# Patient Record
Sex: Female | Born: 1985
Health system: Southern US, Community
[De-identification: ages and names within clinical notes are randomized; demographics above are authoritative.]

## PROBLEM LIST (undated history)

## (undated) DIAGNOSIS — F419 Anxiety disorder, unspecified: Secondary | ICD-10-CM

## (undated) DIAGNOSIS — B2790 Infectious mononucleosis, unspecified without complication: Secondary | ICD-10-CM

## (undated) DIAGNOSIS — F329 Major depressive disorder, single episode, unspecified: Secondary | ICD-10-CM

## (undated) DIAGNOSIS — G43909 Migraine, unspecified, not intractable, without status migrainosus: Secondary | ICD-10-CM

## (undated) DIAGNOSIS — R000 Tachycardia, unspecified: Secondary | ICD-10-CM

## (undated) DIAGNOSIS — F32A Depression, unspecified: Secondary | ICD-10-CM

## (undated) DIAGNOSIS — R002 Palpitations: Secondary | ICD-10-CM

## (undated) DIAGNOSIS — R001 Bradycardia, unspecified: Secondary | ICD-10-CM

## (undated) HISTORY — DX: Depression, unspecified: F32.A

## (undated) HISTORY — DX: Palpitations: R00.2

## (undated) HISTORY — DX: Tachycardia, unspecified: R00.0

## (undated) HISTORY — DX: Anxiety disorder, unspecified: F41.9

## (undated) HISTORY — DX: Bradycardia, unspecified: R00.1

## (undated) HISTORY — DX: Major depressive disorder, single episode, unspecified: F32.9

## (undated) HISTORY — PX: APPENDECTOMY: SHX54

## (undated) HISTORY — DX: Migraine, unspecified, not intractable, without status migrainosus: G43.909

## (undated) HISTORY — PX: TONSILLECTOMY: SUR1361

---

## 2007-08-24 ENCOUNTER — Other Ambulatory Visit: Admission: RE | Admit: 2007-08-24 | Discharge: 2007-08-24 | Payer: Self-pay | Admitting: Obstetrics and Gynecology

## 2007-10-04 ENCOUNTER — Ambulatory Visit (HOSPITAL_BASED_OUTPATIENT_CLINIC_OR_DEPARTMENT_OTHER): Admission: RE | Admit: 2007-10-04 | Discharge: 2007-10-04 | Payer: Self-pay | Admitting: Otolaryngology

## 2007-10-04 ENCOUNTER — Encounter (INDEPENDENT_AMBULATORY_CARE_PROVIDER_SITE_OTHER): Payer: Self-pay | Admitting: Otolaryngology

## 2010-07-15 NOTE — Op Note (Signed)
Lori Rice, Lori Rice            ACCOUNT NO.:  [id number]   MEDICAL RECORD NO.:  [id number]          PATIENT TYPE:  AMB   LOCATION:  DSC                          FACILITY:  [facility]   PHYSICIAN:  Christopher E. Ezzard Standing, M.D.DATE OF BIRTH:  Jul 24, 1985   DATE OF PROCEDURE:  10/04/2007  DATE OF DISCHARGE:                               OPERATIVE REPORT   PREOPERATIVE DIAGNOSIS:  Recurrent tonsillitis.   POSTOPERATIVE DIAGNOSIS:  Recurrent tonsillitis.   OPERATION PERFORMED:  Tonsillectomy.   SURGEON:  Kristine Garbe. Ezzard Standing, MD   ANESTHESIA:  General endotracheal.   COMPLICATIONS:  None.   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  This has been worse over the  last 2 years  and she has had mono.  She has very large cryptic tonsils.  She has had several episodes of strep throat as well as a questionable  abscess and one tonsil.  She is taken to the operating room this time  for tonsillectomy.  She is otherwise healthy.   DESCRIPTION OF PROCEDURE:  After adequate endotracheal anesthesia, the  patient received 1 gram Ancef IV preoperatively as well as 10 mg of  Decadron IV preoperatively.  A mouth gag was used to expose the  oropharynx.  The left and right tonsils were then resected from the  tonsillar fossa using a cautery.  Care was taken to preserve the  anterior-posterior tonsillar pillars as well as the uvula.  Hemostasis  was obtained with cautery.  After obtaining adequate hemostasis, the  oropharynx was irrigated with saline.  This completed the procedure.  Lori Rice was awoken from anesthesia and transferred to recovery and postop  doing well.   DISPOSITION:  Lori Rice was discharged home later this morning on amoxicillin  suspension 500 mg b.i.d. for 1 week, Tylenol and Lortab elixir 3-4  teaspoons q.4 h. p.r.n. pain.  We will have her followup in my office in  2 weeks for recheck.     ______________________________  Kristine Garbe. Ezzard Standing, M.D.     CEN/MEDQ  D:  10/04/2007  T:  10/04/2007  Job:  [id number]

## 2010-11-28 LAB — POCT HEMOGLOBIN-HEMACUE: Hemoglobin: 13.7

## 2011-03-07 ENCOUNTER — Encounter: Payer: Self-pay | Admitting: Emergency Medicine

## 2011-03-07 ENCOUNTER — Emergency Department (HOSPITAL_BASED_OUTPATIENT_CLINIC_OR_DEPARTMENT_OTHER)
Admission: EM | Admit: 2011-03-07 | Discharge: 2011-03-07 | Disposition: A | Discharge status: Home / Self Care | Payer: Managed Care, Other (non HMO) | Attending: Emergency Medicine | Admitting: Emergency Medicine

## 2011-03-07 DIAGNOSIS — R509 Fever, unspecified: Secondary | ICD-10-CM | POA: Insufficient documentation

## 2011-03-07 DIAGNOSIS — J029 Acute pharyngitis, unspecified: Secondary | ICD-10-CM | POA: Insufficient documentation

## 2011-03-07 DIAGNOSIS — M542 Cervicalgia: Secondary | ICD-10-CM | POA: Insufficient documentation

## 2011-03-07 DIAGNOSIS — R51 Headache: Secondary | ICD-10-CM | POA: Insufficient documentation

## 2011-03-07 HISTORY — DX: Infectious mononucleosis, unspecified without complication: B27.90

## 2011-03-07 LAB — MONONUCLEOSIS SCREEN: Mono Screen: NEGATIVE

## 2011-03-07 MED ORDER — KETOROLAC TROMETHAMINE 30 MG/ML IJ SOLN
30.0000 mg | Freq: Once | INTRAMUSCULAR | Status: AC
  Administered 2011-03-07: 30 mg via INTRAVENOUS
  Filled 2011-03-07: qty 1

## 2011-03-07 MED ORDER — ONDANSETRON HCL 4 MG/2ML IJ SOLN
4.0000 mg | Freq: Once | INTRAMUSCULAR | Status: AC
  Administered 2011-03-07: 4 mg via INTRAVENOUS
  Filled 2011-03-07: qty 2

## 2011-03-07 MED ORDER — LIDOCAINE VISCOUS 2 % MT SOLN
20.0000 mL | Freq: Once | OROMUCOSAL | Status: AC
  Administered 2011-03-07: 20 mL via OROMUCOSAL
  Filled 2011-03-07: qty 15

## 2011-03-07 MED ORDER — SODIUM CHLORIDE 0.9 % IV BOLUS (SEPSIS)
1000.0000 mL | Freq: Once | INTRAVENOUS | Status: AC
  Administered 2011-03-07: 1000 mL via INTRAVENOUS

## 2011-03-07 NOTE — ED Notes (Signed)
Lori Pickering, FNP at bedside 

## 2011-03-07 NOTE — ED Notes (Signed)
Rapid strep collected and sent to lab.

## 2011-03-07 NOTE — ED Notes (Signed)
Sore throat, fever, headache, neck pain, generalized aches and pains x 3 days.  Pt relates that it is getting progressively worse.

## 2011-03-07 NOTE — ED Provider Notes (Signed)
History     CSN: [id number]  Arrival date & time 03/07/11  1318   First MD Initiated Contact with Patient 03/07/11 1408      Chief Complaint  Patient presents with  . Sore Throat  . Fever  . Headache  . Neck Pain    (Consider location/radiation/quality/duration/timing/severity/associated sxs/prior treatment) HPI Comments: Pt states that she was seen at urgent care yesterday and given a shot of steriods levaquin and pain medication:pt states that she felt better for a little while but now the symptoms are back  Patient is a 26 y.o. female presenting with pharyngitis. The history is provided by the patient and a parent.  Sore Throat This is a new problem. The current episode started in the past 7 days. The problem occurs constantly. The problem has been unchanged. Associated symptoms include a fever, headaches, myalgias and neck pain. Pertinent negatives include no coughing. The symptoms are aggravated by nothing. She has tried nothing for the symptoms.    Past Medical History  Diagnosis Date  . Mononucleosis     Past Surgical History  Procedure Date  . Tonsillectomy     History reviewed. No pertinent family history.  History  Substance Use Topics  . Smoking status: Not on file  . Smokeless tobacco: Not on file  . Alcohol Use:     OB History    Grav Para Term Preterm Abortions TAB SAB Ect Mult Living                  Review of Systems  Constitutional: Positive for fever.  HENT: Positive for neck pain.   Respiratory: Negative for cough.   Musculoskeletal: Positive for myalgias.  Neurological: Positive for headaches.  All other systems reviewed and are negative.    Allergies  Review of patient's allergies indicates no known allergies.  Home Medications   Current Outpatient Rx  Name Route Sig Dispense Refill  . HYDROMET PO Oral Take 5 mLs by mouth every 8 (eight) hours as needed.      . IBUPROFEN 200 MG PO TABS Oral Take 400 mg by mouth every 6 (six)  hours as needed.      [name] LEVOFLOXACIN 500 MG PO TABS Oral Take 500 mg by mouth daily.      [name] PROMETHAZINE HCL 25 MG RE SUPP Rectal Place 25 mg rectally every 8 (eight) hours as needed.        BP 109/69  Pulse 79  Temp(Src) 99.2 F (37.3 C) (Oral)  Resp 16  SpO2 98%  LMP 02/09/2011  Physical Exam  Nursing note and vitals reviewed. Constitutional: She is oriented to person, place, and time. She appears well-developed.  HENT:  Head: Normocephalic and atraumatic.  Left Ear: External ear normal.  Nose: Rhinorrhea present.  Mouth/Throat: Posterior oropharyngeal edema and posterior oropharyngeal erythema present.  Neck: Normal range of motion. Neck supple.  Cardiovascular: Normal rate.   Pulmonary/Chest: Effort normal.  Abdominal: Soft. Bowel sounds are normal.  Musculoskeletal: Normal range of motion.  Neurological: She is alert and oriented to person, place, and time.  Skin: Skin is warm and dry.  Psychiatric: She has a normal mood and affect.    ED Course  Procedures (including critical care time)   Labs Reviewed  RAPID STREP SCREEN  MONONUCLEOSIS SCREEN   No results found.   1. Pharyngitis       MDM  Pt feeling a lot better for fluid:pt is okay to follow up:pt was given viscous lidocaine to  mix and swish and spit at home        Teressa Lower, NP 03/07/11 1609

## 2011-03-08 NOTE — ED Provider Notes (Signed)
Medical screening examination/treatment/procedure(s) were performed by non-physician practitioner and as supervising physician I was immediately available for consultation/collaboration.   Jaciel Diem A. Halley Shepheard, MD 03/08/11 0701 

## 2012-07-21 ENCOUNTER — Encounter (HOSPITAL_COMMUNITY)
Admission: EM | Disposition: A | Discharge status: Home / Self Care | Payer: Self-pay | Source: Home / Self Care | Attending: Emergency Medicine

## 2012-07-21 ENCOUNTER — Encounter (HOSPITAL_BASED_OUTPATIENT_CLINIC_OR_DEPARTMENT_OTHER): Payer: Self-pay | Admitting: Emergency Medicine

## 2012-07-21 ENCOUNTER — Emergency Department (HOSPITAL_BASED_OUTPATIENT_CLINIC_OR_DEPARTMENT_OTHER): Payer: Managed Care, Other (non HMO)

## 2012-07-21 ENCOUNTER — Emergency Department (HOSPITAL_COMMUNITY): Payer: Managed Care, Other (non HMO) | Admitting: Anesthesiology

## 2012-07-21 ENCOUNTER — Encounter (HOSPITAL_COMMUNITY): Payer: Self-pay | Admitting: Anesthesiology

## 2012-07-21 ENCOUNTER — Observation Stay (HOSPITAL_BASED_OUTPATIENT_CLINIC_OR_DEPARTMENT_OTHER)
Admission: EM | Admit: 2012-07-21 | Discharge: 2012-07-22 | Disposition: A | Discharge status: Home / Self Care | Payer: Managed Care, Other (non HMO) | Attending: General Surgery | Admitting: General Surgery

## 2012-07-21 DIAGNOSIS — K37 Unspecified appendicitis: Secondary | ICD-10-CM | POA: Diagnosis present

## 2012-07-21 DIAGNOSIS — K3580 Unspecified acute appendicitis: Principal | ICD-10-CM | POA: Insufficient documentation

## 2012-07-21 DIAGNOSIS — R110 Nausea: Secondary | ICD-10-CM | POA: Insufficient documentation

## 2012-07-21 DIAGNOSIS — R339 Retention of urine, unspecified: Secondary | ICD-10-CM | POA: Insufficient documentation

## 2012-07-21 HISTORY — PX: LAPAROSCOPIC APPENDECTOMY: SHX408

## 2012-07-21 LAB — URINALYSIS, ROUTINE W REFLEX MICROSCOPIC
Bilirubin Urine: NEGATIVE
Hgb urine dipstick: NEGATIVE
Nitrite: NEGATIVE
Protein, ur: NEGATIVE mg/dL
Urobilinogen, UA: 0.2 mg/dL (ref 0.0–1.0)

## 2012-07-21 LAB — WET PREP, GENITAL
Clue Cells Wet Prep HPF POC: NONE SEEN
Trich, Wet Prep: NONE SEEN

## 2012-07-21 LAB — CBC WITH DIFFERENTIAL/PLATELET
Eosinophils Absolute: 0 10*3/uL (ref 0.0–0.7)
Lymphocytes Relative: 7 % — ABNORMAL LOW (ref 12–46)
Lymphs Abs: 0.7 10*3/uL (ref 0.7–4.0)
MCH: 30.7 pg (ref 26.0–34.0)
Neutro Abs: 9.1 10*3/uL — ABNORMAL HIGH (ref 1.7–7.7)
Neutrophils Relative %: 85 % — ABNORMAL HIGH (ref 43–77)
Platelets: 167 10*3/uL (ref 150–400)
RBC: 4.46 MIL/uL (ref 3.87–5.11)
WBC: 10.7 10*3/uL — ABNORMAL HIGH (ref 4.0–10.5)

## 2012-07-21 LAB — COMPREHENSIVE METABOLIC PANEL
ALT: 23 U/L (ref 0–35)
Alkaline Phosphatase: 80 U/L (ref 39–117)
Chloride: 103 mEq/L (ref 96–112)
GFR calc Af Amer: >90 mL/min (ref >90)
Glucose, Bld: 115 mg/dL — ABNORMAL HIGH (ref 70–99)
Potassium: 4.2 mEq/L (ref 3.5–5.1)
Sodium: 139 mEq/L (ref 135–145)
Total Protein: 7 g/dL (ref 6.0–8.3)

## 2012-07-21 LAB — CBC
HCT: 35.9 % — ABNORMAL LOW (ref 36.0–46.0)
MCV: 89.8 fL (ref 78.0–100.0)
RBC: 4 MIL/uL (ref 3.87–5.11)
WBC: 8.3 10*3/uL (ref 4.0–10.5)

## 2012-07-21 LAB — CREATININE, SERUM: GFR calc Af Amer: >90 mL/min (ref >90)

## 2012-07-21 SURGERY — APPENDECTOMY, LAPAROSCOPIC
Anesthesia: General | Site: Abdomen | Wound class: Dirty or Infected

## 2012-07-21 MED ORDER — FENTANYL CITRATE 0.05 MG/ML IJ SOLN
INTRAMUSCULAR | Status: DC | PRN
  Administered 2012-07-21: 100 ug via INTRAVENOUS
  Administered 2012-07-21: 50 ug via INTRAVENOUS

## 2012-07-21 MED ORDER — FENTANYL CITRATE 0.05 MG/ML IJ SOLN
100.0000 ug | Freq: Once | INTRAMUSCULAR | Status: AC
  Administered 2012-07-21: 100 ug via INTRAVENOUS
  Filled 2012-07-21: qty 2

## 2012-07-21 MED ORDER — ONDANSETRON HCL 4 MG PO TABS: 4.0000 mg | ORAL_TABLET | Freq: Four times a day (QID) | ORAL | Status: DC | PRN

## 2012-07-21 MED ORDER — HYDROMORPHONE HCL PF 1 MG/ML IJ SOLN
INTRAMUSCULAR | Status: AC
  Filled 2012-07-21: qty 1

## 2012-07-21 MED ORDER — OXYCODONE-ACETAMINOPHEN 5-325 MG PO TABS: 1.0000 | ORAL_TABLET | ORAL | Status: DC | PRN

## 2012-07-21 MED ORDER — GLYCOPYRROLATE 0.2 MG/ML IJ SOLN
INTRAMUSCULAR | Status: DC | PRN
  Administered 2012-07-21: 0.6 mg via INTRAVENOUS
  Administered 2012-07-21: 0.2 mg via INTRAVENOUS

## 2012-07-21 MED ORDER — ONDANSETRON HCL 4 MG/2ML IJ SOLN
4.0000 mg | Freq: Once | INTRAMUSCULAR | Status: AC
  Administered 2012-07-21: 4 mg via INTRAVENOUS
  Filled 2012-07-21: qty 2

## 2012-07-21 MED ORDER — ACETAMINOPHEN 10 MG/ML IV SOLN
INTRAVENOUS | Status: AC
  Filled 2012-07-21: qty 100

## 2012-07-21 MED ORDER — ROCURONIUM BROMIDE 100 MG/10ML IV SOLN
INTRAVENOUS | Status: DC | PRN
  Administered 2012-07-21: 2 mg via INTRAVENOUS
  Administered 2012-07-21: 10 mg via INTRAVENOUS
  Administered 2012-07-21: 28 mg via INTRAVENOUS

## 2012-07-21 MED ORDER — BUPIVACAINE HCL (PF) 0.25 % IJ SOLN
INTRAMUSCULAR | Status: AC
  Filled 2012-07-21: qty 30

## 2012-07-21 MED ORDER — LACTATED RINGERS IV SOLN
INTRAVENOUS | Status: DC
  Administered 2012-07-21 (×2): via INTRAVENOUS

## 2012-07-21 MED ORDER — HYDROMORPHONE HCL PF 1 MG/ML IJ SOLN
0.2500 mg | INTRAMUSCULAR | Status: DC | PRN
  Administered 2012-07-21 (×2): 0.5 mg via INTRAVENOUS

## 2012-07-21 MED ORDER — PROPOFOL 10 MG/ML IV BOLUS
INTRAVENOUS | Status: DC | PRN
  Administered 2012-07-21: 150 mg via INTRAVENOUS

## 2012-07-21 MED ORDER — LIDOCAINE HCL (PF) 2 % IJ SOLN
INTRAMUSCULAR | Status: DC | PRN
  Administered 2012-07-21: 75 mg

## 2012-07-21 MED ORDER — HEPARIN SODIUM (PORCINE) 5000 UNIT/ML IJ SOLN
5000.0000 [IU] | Freq: Three times a day (TID) | INTRAMUSCULAR | Status: DC
  Administered 2012-07-22: 5000 [IU] via SUBCUTANEOUS
  Filled 2012-07-21 (×3): qty 1

## 2012-07-21 MED ORDER — MIDAZOLAM HCL 5 MG/5ML IJ SOLN
INTRAMUSCULAR | Status: DC | PRN
  Administered 2012-07-21: 2 mg via INTRAVENOUS

## 2012-07-21 MED ORDER — ERTAPENEM SODIUM 1 G IJ SOLR
1.0000 g | Freq: Once | INTRAMUSCULAR | Status: AC
  Administered 2012-07-21: 1 g via INTRAVENOUS
  Filled 2012-07-21: qty 1

## 2012-07-21 MED ORDER — KCL IN DEXTROSE-NACL 20-5-0.45 MEQ/L-%-% IV SOLN
INTRAVENOUS | Status: DC
  Administered 2012-07-21 – 2012-07-22 (×3): via INTRAVENOUS
  Filled 2012-07-21 (×4): qty 1000

## 2012-07-21 MED ORDER — IOHEXOL 300 MG/ML  SOLN
100.0000 mL | Freq: Once | INTRAMUSCULAR | Status: AC | PRN
  Administered 2012-07-21: 100 mL via INTRAVENOUS

## 2012-07-21 MED ORDER — IOHEXOL 300 MG/ML  SOLN
50.0000 mL | Freq: Once | INTRAMUSCULAR | Status: AC | PRN
  Administered 2012-07-21: 50 mL via ORAL

## 2012-07-21 MED ORDER — LACTATED RINGERS IR SOLN
Status: DC | PRN
  Administered 2012-07-21: 300 mL

## 2012-07-21 MED ORDER — BUPIVACAINE HCL (PF) 0.25 % IJ SOLN
INTRAMUSCULAR | Status: DC | PRN
  Administered 2012-07-21: 7 mL

## 2012-07-21 MED ORDER — ACETAMINOPHEN 10 MG/ML IV SOLN
INTRAVENOUS | Status: DC | PRN
  Administered 2012-07-21: 1000 mg via INTRAVENOUS

## 2012-07-21 MED ORDER — HYDROMORPHONE HCL PF 1 MG/ML IJ SOLN
1.0000 mg | Freq: Once | INTRAMUSCULAR | Status: AC
  Administered 2012-07-21: 1 mg via INTRAVENOUS
  Filled 2012-07-21: qty 1

## 2012-07-21 MED ORDER — PROMETHAZINE HCL 25 MG/ML IJ SOLN: 6.2500 mg | INTRAMUSCULAR | Status: DC | PRN

## 2012-07-21 MED ORDER — SODIUM CHLORIDE 0.9 % IV SOLN
1.0000 g | INTRAVENOUS | Status: DC
  Administered 2012-07-22: 1 g via INTRAVENOUS
  Filled 2012-07-21: qty 1

## 2012-07-21 MED ORDER — ONDANSETRON HCL 4 MG/2ML IJ SOLN
4.0000 mg | Freq: Four times a day (QID) | INTRAMUSCULAR | Status: DC | PRN
  Administered 2012-07-21 – 2012-07-22 (×2): 4 mg via INTRAVENOUS
  Filled 2012-07-21 (×2): qty 2

## 2012-07-21 MED ORDER — HYDROMORPHONE HCL PF 2 MG/ML IJ SOLN
2.0000 mg | INTRAMUSCULAR | Status: DC | PRN
  Administered 2012-07-21 – 2012-07-22 (×3): 2 mg via INTRAVENOUS
  Filled 2012-07-21 (×4): qty 1

## 2012-07-21 MED ORDER — SODIUM CHLORIDE 0.9 % IV BOLUS (SEPSIS)
1000.0000 mL | Freq: Once | INTRAVENOUS | Status: AC
  Administered 2012-07-21: 1000 mL via INTRAVENOUS

## 2012-07-21 MED ORDER — NEOSTIGMINE METHYLSULFATE 1 MG/ML IJ SOLN
INTRAMUSCULAR | Status: DC | PRN
  Administered 2012-07-21: 4 mg via INTRAVENOUS

## 2012-07-21 MED ORDER — SUCCINYLCHOLINE CHLORIDE 20 MG/ML IJ SOLN
INTRAMUSCULAR | Status: DC | PRN
  Administered 2012-07-21: 100 mg via INTRAVENOUS

## 2012-07-21 MED ORDER — ONDANSETRON HCL 4 MG/2ML IJ SOLN
INTRAMUSCULAR | Status: DC | PRN
  Administered 2012-07-21: 4 mg via INTRAVENOUS

## 2012-07-21 SURGICAL SUPPLY — 39 items
APPLIER CLIP ROT 10 11.4 M/L (STAPLE) ×2
BENZOIN TINCTURE PRP APPL 2/3 (GAUZE/BANDAGES/DRESSINGS) ×2 IMPLANT
CANISTER SUCTION 2500CC (MISCELLANEOUS) ×2 IMPLANT
CHLORAPREP W/TINT 10.5 ML (MISCELLANEOUS) ×2 IMPLANT
CLIP APPLIE ROT 10 11.4 M/L (STAPLE) ×1 IMPLANT
CLOTH BEACON ORANGE TIMEOUT ST (SAFETY) ×2 IMPLANT
CUTTER FLEX LINEAR 45M (STAPLE) ×2 IMPLANT
DECANTER SPIKE VIAL GLASS SM (MISCELLANEOUS) IMPLANT
DERMABOND ADVANCED (GAUZE/BANDAGES/DRESSINGS) ×1
DERMABOND ADVANCED .7 DNX12 (GAUZE/BANDAGES/DRESSINGS) ×1 IMPLANT
DRAPE LAPAROSCOPIC ABDOMINAL (DRAPES) ×2 IMPLANT
DRAPE UTILITY XL STRL (DRAPES) ×2 IMPLANT
ELECT REM PT RETURN 9FT ADLT (ELECTROSURGICAL) ×2
ELECTRODE REM PT RTRN 9FT ADLT (ELECTROSURGICAL) ×1 IMPLANT
ENDOLOOP SUT PDS II  0 18 (SUTURE)
ENDOLOOP SUT PDS II 0 18 (SUTURE) IMPLANT
GLOVE BIOGEL PI IND STRL 7.0 (GLOVE) ×2 IMPLANT
GLOVE BIOGEL PI INDICATOR 7.0 (GLOVE) ×2
GLOVE EUDERMIC 7 POWDERFREE (GLOVE) ×2 IMPLANT
GOWN STRL NON-REIN LRG LVL3 (GOWN DISPOSABLE) IMPLANT
GOWN STRL REIN XL XLG (GOWN DISPOSABLE) IMPLANT
HAND ACTIVATED (MISCELLANEOUS) ×2 IMPLANT
KIT BASIN OR (CUSTOM PROCEDURE TRAY) ×2 IMPLANT
PENCIL BUTTON HOLSTER BLD 10FT (ELECTRODE) IMPLANT
POUCH SPECIMEN RETRIEVAL 10MM (ENDOMECHANICALS) ×2 IMPLANT
RELOAD 45 VASCULAR/THIN (ENDOMECHANICALS) IMPLANT
RELOAD STAPLE TA45 3.5 REG BLU (ENDOMECHANICALS) ×2 IMPLANT
SET IRRIG TUBING LAPAROSCOPIC (IRRIGATION / IRRIGATOR) ×2 IMPLANT
SOLUTION ANTI FOG 6CC (MISCELLANEOUS) ×2 IMPLANT
STRIP CLOSURE SKIN 1/4X4 (GAUZE/BANDAGES/DRESSINGS) ×2 IMPLANT
SUT MNCRL AB 4-0 PS2 18 (SUTURE) ×2 IMPLANT
TOWEL OR 17X26 10 PK STRL BLUE (TOWEL DISPOSABLE) ×2 IMPLANT
TOWEL OR NON WOVEN STRL DISP B (DISPOSABLE) ×2 IMPLANT
TRAY FOLEY CATH 14FRSI W/METER (CATHETERS) ×2 IMPLANT
TRAY LAP CHOLE (CUSTOM PROCEDURE TRAY) ×2 IMPLANT
TROCAR BLADELESS OPT 5 75 (ENDOMECHANICALS) ×4 IMPLANT
TROCAR XCEL BLUNT TIP 100MML (ENDOMECHANICALS) ×2 IMPLANT
TROCAR XCEL NON-BLD 11X100MML (ENDOMECHANICALS) IMPLANT
TUBING INSUFFLATION 10FT LAP (TUBING) ×2 IMPLANT

## 2012-07-21 NOTE — Transfer of Care (Signed)
Immediate Anesthesia Transfer of Care Note  Patient: Lori Rice  Procedure(s) Performed: Procedure(s): APPENDECTOMY LAPAROSCOPIC (N/A)  Patient Location: PACU  Anesthesia Type:General  Level of Consciousness: awake, oriented and patient cooperative  Airway & Oxygen Therapy: Patient Spontanous Breathing and Patient connected to face mask oxygen  Post-op Assessment: Report given to PACU RN, Post -op Vital signs reviewed and stable and Patient moving all extremities X 4  Post vital signs: Reviewed and stable  Complications: No apparent anesthesia complications

## 2012-07-21 NOTE — Anesthesia Preprocedure Evaluation (Signed)
Anesthesia Evaluation  Patient identified by MRN, date of birth, ID band Patient awake    Reviewed: Allergy & Precautions, H&P , NPO status , Patient's Chart, lab work & pertinent test results  Airway Mallampati: II TM Distance: >3 FB Neck ROM: Full    Dental no notable dental hx.    Pulmonary neg pulmonary ROS,  breath sounds clear to auscultation  Pulmonary exam normal       Cardiovascular Exercise Tolerance: Good Rhythm:Regular Rate:Normal     Neuro/Psych negative neurological ROS  negative psych ROS   GI/Hepatic negative GI ROS, Neg liver ROS,   Endo/Other  negative endocrine ROS  Renal/GU negative Renal ROS  negative genitourinary   Musculoskeletal negative musculoskeletal ROS (+)   Abdominal   Peds negative pediatric ROS (+)  Hematology negative hematology ROS (+)   Anesthesia Other Findings   Reproductive/Obstetrics Negative pregnancy test.                           Anesthesia Physical Anesthesia Plan  ASA: I and emergent  Anesthesia Plan: General   Post-op Pain Management:    Induction: Intravenous  Airway Management Planned: Oral ETT  Additional Equipment:   Intra-op Plan:   Post-operative Plan: Extubation in OR  Informed Consent: I have reviewed the patients History and Physical, chart, labs and discussed the procedure including the risks, benefits and alternatives for the proposed anesthesia with the patient or authorized representative who has indicated his/her understanding and acceptance.   Dental advisory given  Plan Discussed with: CRNA  Anesthesia Plan Comments:         Anesthesia Quick Evaluation

## 2012-07-21 NOTE — Op Note (Signed)
Lori Rice February 28, 1986 [id number] 07/21/2012  Preoperative diagnosis:  Retrocecal appendicitis  Postoperative diagnosis: same  Procedure: laparoscopic appendectomy  Surgeon: Currie Paris, MD, FACS  Assistant: Aris Georgia, PA  Anesthesia: General   Clinical History and Indications: this patient presented with atypical history for appendicitis and a CT scan which showed early appendicitis with a retrocecal appendix.    Description of Procedure: I saw the patient in the preoperative area and confirmed with the plans She was taken to the operating room after satisfactory general anesthesia was obtained a Foley catheter was placed and the abdomen was prepped and draped.the time out was done.  0.25% plain Marcaine each for each incision. The umbilical incision was made, the fascia identified, and the peritoneal cavity entered under direct vision. Hasson cannula was inserted and the abdomen insufflated to 15.under direct vision a 5 mm trochars placed in the right upper quadrant and another in the left lower quadrant. The camera was moved to the left lower quadrant.  I was able to identify the tip appendix laying laterally and fairly high. The tip of the appendix was grasped and the need to appendix was divided with the harmonic device. When I got to the base of the appendix it was divided with the laparoscopic GIA device. I had excellent closure with no bleeding. The appendix was placed in a bag and brought out via the umbilical port site.  The Hasson cannula was placed again and irrigation and checked for hemostasis was made. Everything looked okay so the two five millimeter ports were removed under direct vision. The abdomen was deflated and the umbilical site and the pursestring used to close the fascia. The skin was closed with 4-0 Monocryl and Dermabond.  The patient tolerated the procedure well. There were no operative complications. Counts were correct. Blood loss was  minimal.  Currie Paris, MD, FACS 07/21/2012 1:08 PM

## 2012-07-21 NOTE — ED Notes (Signed)
Pt with diffuse abdominal pain that started this am, but got significantly worse after dinner, reports constant nausea, tolerated po's all day with no difficulty no vomiting, 2 episode of diarrhea today, pt is due to start period at any time, states that her periods have never started like this

## 2012-07-21 NOTE — ED Provider Notes (Signed)
History     CSN: [id number]  Arrival date & time 07/21/12  0258   First MD Initiated Contact with Patient 07/21/12 [phone]      Chief Complaint  Patient presents with  . Abdominal Pain    (Consider location/radiation/quality/duration/timing/severity/associated sxs/prior treatment) HPI This is a 27 year old female with abdominal pain that began yesterday morning. It is steadily gotten worse. It is most prominent in the right lower quadrant. It is associated with nausea and diarrhea but no vomiting. She denies urinary changes, vaginal bleeding or vaginal discharge. The pain is worse with movement or deep breathing. It is improved with lying still. The pain is about a 7.5/10 and is described as a deep ache. She denies fever.  Past Medical History  Diagnosis Date  . Mononucleosis     Past Surgical History  Procedure Laterality Date  . Tonsillectomy      No family history on file.  History  Substance Use Topics  . Smoking status: Never Smoker   . Smokeless tobacco: Not on file  . Alcohol Use: Yes     Comment: occ    OB History   Grav Para Term Preterm Abortions TAB SAB Ect Mult Living                  Review of Systems  All other systems reviewed and are negative.    Allergies  Review of patient's allergies indicates no known allergies.  Home Medications   Current Outpatient Rx  Name  Route  Sig  Dispense  Refill  . Hydrocodone-Homatropine (HYDROMET PO)   Oral   Take 5 mLs by mouth every 8 (eight) hours as needed.           [name] ibuprofen (ADVIL,MOTRIN) 200 MG tablet   Oral   Take 400 mg by mouth every 6 (six) hours as needed.           [name] levofloxacin (LEVAQUIN) 500 MG tablet   Oral   Take 500 mg by mouth daily.           . promethazine (PHENERGAN) 25 MG suppository   Rectal   Place 25 mg rectally every 8 (eight) hours as needed.             BP 107/69  Pulse 73  Temp(Src) 98.8 F (37.1 C) (Oral)  Resp 16  Ht 5' 4.5" (1.638 m)  Wt 154 lb  (69.854 kg)  BMI 26.04 kg/m2  SpO2 99%  LMP 06/21/2012  Physical Exam General: Well-developed, well-nourished female in no acute distress; appearance consistent with age of record HENT: normocephalic, atraumatic Eyes: pupils equal round and reactive to light; extraocular muscles intact Neck: supple Heart: regular rate and rhythm Lungs: clear to auscultation bilaterally Abdomen: soft; nondistended; right lower quadrant tenderness; no masses or hepatosplenomegaly; bowel sounds present; no gallstones seen on bedside ultrasound GU: Normal external genitalia; physiologic appearing vaginal discharge; vaginal bleeding; no cervical motion tenderness; no adnexal tenderness Extremities: No deformity; full range of motion; pulses normal Neurologic: Awake, alert and oriented; motor function intact in all extremities and symmetric; no facial droop Skin: Warm and dry Psychiatric: Normal mood and affect    ED Course  Procedures (including critical care time)     MDM   Nursing notes and vitals signs, including pulse oximetry, reviewed.  Summary of this visit's results, reviewed by myself:  Labs:  Results for orders placed during the hospital encounter of 07/21/12 (from the past 24 hour(s))  URINALYSIS, ROUTINE W REFLEX MICROSCOPIC  Status: Abnormal   Collection Time    07/21/12  3:15 AM      Result Value Range   Color, Urine YELLOW  YELLOW   APPearance CLOUDY (*) CLEAR   Specific Gravity, Urine 1.037 (*) 1.005 - 1.030   pH 7.5  5.0 - 8.0   Glucose, UA NEGATIVE  NEGATIVE mg/dL   Hgb urine dipstick NEGATIVE  NEGATIVE   Bilirubin Urine NEGATIVE  NEGATIVE   Ketones, ur 15 (*) NEGATIVE mg/dL   Protein, ur NEGATIVE  NEGATIVE mg/dL   Urobilinogen, UA 0.2  0.0 - 1.0 mg/dL   Nitrite NEGATIVE  NEGATIVE   Leukocytes, UA NEGATIVE  NEGATIVE  PREGNANCY, URINE     Status: None   Collection Time    07/21/12  3:15 AM      Result Value Range   Preg Test, Ur NEGATIVE  NEGATIVE  WET PREP,  GENITAL     Status: Abnormal   Collection Time    07/21/12  3:59 AM      Result Value Range   Yeast Wet Prep HPF POC NONE SEEN  NONE SEEN   Trich, Wet Prep NONE SEEN  NONE SEEN   Clue Cells Wet Prep HPF POC NONE SEEN  NONE SEEN   WBC, Wet Prep HPF POC FEW (*) NONE SEEN  CBC WITH DIFFERENTIAL     Status: Abnormal   Collection Time    07/21/12  4:14 AM      Result Value Range   WBC 10.7 (*) 4.0 - 10.5 K/uL   RBC 4.46  3.87 - 5.11 MIL/uL   Hemoglobin 13.7  12.0 - 15.0 g/dL   HCT 16.1  09.6 - 04.5 %   MCV 89.9  78.0 - 100.0 fL   MCH 30.7  26.0 - 34.0 pg   MCHC 34.2  30.0 - 36.0 g/dL   RDW 40.9  81.1 - 91.4 %   Platelets 167  150 - 400 K/uL   Neutrophils Relative % 85 (*) 43 - 77 %   Neutro Abs 9.1 (*) 1.7 - 7.7 K/uL   Lymphocytes Relative 7 (*) 12 - 46 %   Lymphs Abs 0.7  0.7 - 4.0 K/uL   Monocytes Relative 9  3 - 12 %   Monocytes Absolute 0.9  0.1 - 1.0 K/uL   Eosinophils Relative 0  0 - 5 %   Eosinophils Absolute 0.0  0.0 - 0.7 K/uL   Basophils Relative 0  0 - 1 %   Basophils Absolute 0.0  0.0 - 0.1 K/uL  COMPREHENSIVE METABOLIC PANEL     Status: Abnormal   Collection Time    07/21/12  4:14 AM      Result Value Range   Sodium 139  135 - 145 mEq/L   Potassium 4.2  3.5 - 5.1 mEq/L   Chloride 103  96 - 112 mEq/L   CO2 25  19 - 32 mEq/L   Glucose, Bld 115 (*) 70 - 99 mg/dL   BUN 19  6 - 23 mg/dL   Creatinine, Ser 7.82  0.50 - 1.10 mg/dL   Calcium 9.4  8.4 - 95.6 mg/dL   Total Protein 7.0  6.0 - 8.3 g/dL   Albumin 4.0  3.5 - 5.2 g/dL   AST 25  0 - 37 U/L   ALT 23  0 - 35 U/L   Alkaline Phosphatase 80  39 - 117 U/L   Total Bilirubin 0.4  0.3 - 1.2 mg/dL   GFR  calc non Af Amer >90  >90 mL/min   GFR calc Af Amer >90  >90 mL/min    Imaging Studies: Ct Abdomen Pelvis W Contrast  07/21/2012   *RADIOLOGY REPORT*  Clinical Data: Right lower quadrant abdominal pain.  CT ABDOMEN AND PELVIS WITH CONTRAST  Technique:  Multidetector CT imaging of the abdomen and pelvis was  performed following the standard protocol during bolus administration of intravenous contrast.  Contrast: 50mL OMNIPAQUE IOHEXOL 300 MG/ML  SOLN, OMNIPAQUE IOHEXOL 300 MG/ML  SOLN  Comparison: None  Findings: The lung bases are clear.  No pleural effusion or pulmonary nodule.  The solid abdominal organs are normal.  The gallbladder is normal. No common bile duct dilatation.  The stomach, duodenum, small bowel and colon are unremarkable.  No inflammatory changes or mass lesions.  The appendix is mildly dilated and fluid-filled with mucosal enhancement and periappendiceal inflammatory change.  It is retrocecal and is coursing up along the liver edge.  Small appendicoliths are suspected.  The uterus and ovaries are normal.  No pelvic mass, adenopathy or significant free pelvic fluid collections.  The aorta is normal.  The branch vessels are normal.  No mesenteric or retroperitoneal mass or adenopathy.  IMPRESSION: CT findings consistent with acute appendicitis as discussed above.   Original Report Authenticated By: Rudie Meyer, M.D.   6:13 AM Pincus Sanes ordered for acute appendicitis.          Hanley Seamen, MD 07/21/12 [phone]

## 2012-07-21 NOTE — ED Notes (Signed)
Dr Jamey Ripa reports that pt will go to OR around noon-1p due to OR being backed up.

## 2012-07-21 NOTE — Anesthesia Postprocedure Evaluation (Signed)
  Anesthesia Post-op Note  Patient: Lori Rice  Procedure(s) Performed: Procedure(s) (LRB): APPENDECTOMY LAPAROSCOPIC (N/A)  Patient Location: PACU  Anesthesia Type: General  Level of Consciousness: awake and alert   Airway and Oxygen Therapy: Patient Spontanous Breathing  Post-op Pain: mild  Post-op Assessment: Post-op Vital signs reviewed, Patient's Cardiovascular Status Stable, Respiratory Function Stable, Patent Airway and No signs of Nausea or vomiting  Last Vitals:  Filed Vitals:   07/21/12 1430  BP: 107/54  Pulse: 48  Temp: 37 C  Resp: 11    Post-op Vital Signs: stable   Complications: No apparent anesthesia complications

## 2012-07-21 NOTE — H&P (Signed)
NAME: Lori Rice DOB: 29-Feb-1986 MRN: [id number]                                                                                      DATE: 07/21/2012  PCP: No primary provider on file. Referring Provider: No ref. provider found  IMPRESSION:  Acute appendicitis, probably retrocecal  PLAN:   appendectomy today. We will attempt to do laparoscopically. I have reviewed the plans risks and complications with the patient and her parents. All questions have been answered and they would wish to proceed.                 CC:  Chief Complaint  Patient presents with  . Abdominal Pain    HPI:  Lori Rice is a 27 y.o.  female who presents for evaluation of right lower quadrant pain. She felt some abdominal discomfort yesterday with some bloating. Over time it is centered more into the right lower quadrant. She has some nausea, as well as a little diarrhea yesterday. It got worse last night and she presented to the emergency department. On evaluation she was noted to have right lower quadrant tenderness with a slight elevation in her white cell count. A CT scan was confirmatory for appendicitis with what appears to be a retrocecal appendix with the tip close to the liver.  PMH:  has a past medical history of Mononucleosis.  PSH:   has past surgical history that includes Tonsillectomy.  ALLERGIES:  No Known Allergies  MEDICATIONS: Current facility-administered medications:lactated ringers infusion, , Intravenous, Continuous, Currie Paris, MD Current outpatient prescriptions:Hydrocodone-Homatropine (HYDROMET PO), Take 5 mLs by mouth every 8 (eight) hours as needed.  , Disp: , Rfl: ;  ibuprofen (ADVIL,MOTRIN) 200 MG tablet, Take 400 mg by mouth every 6 (six) hours as needed.  , Disp: , Rfl: ;  levofloxacin (LEVAQUIN) 500 MG tablet, Take 500 mg by mouth daily.  , Disp: , Rfl:  promethazine (PHENERGAN) 25 MG suppository, Place 25 mg rectally every 8 (eight) hours as needed.  , Disp: ,  Rfl:   ROS: She in good health and has a negative review of systems.[name] EXAM:   VITAL SIGNS:  BP 98/53  Pulse 73  Temp(Src) 98.5 F (36.9 C) (Oral)  Resp 18  Ht 5' 4.5" (1.638 m)  Wt 154 lb (69.854 kg)  BMI 26.04 kg/m2  SpO2 100%  LMP 06/21/2012  GENERAL:  The patient is alert, oriented, and generally healthy-appearing, NAD. Mood and affect are normal.  HEENT:  The head is normocephalic, the eyes nonicteric, the pupils were round regular and equal. EOMs are normal. Pharynx normal. Dentition good.  NECK:  The neck is supple and there are no masses or thyromegaly.  LUNGS: Normal respirations and clear to auscultation.  HEART: Regular rhythm, with no murmurs rubs or gallops. Pulses are intact carotid dorsalis pedis and posterior tibial. No significant varicosities are noted.   ABDOMEN: The abdomen is not distended. It is soft except for some mild guarding in the right lower quadrant and right midabdomen. She has some right lower cord or tenderness. There is some localized rebound but no referred or rebound. Bowel sounds are  positive.l.  EXTREMITIES:  Good range of motion, no edema.   DATA REVIEWED:  I have reviewed the notes from the emergency department physician, laboratory studies, the CT report and films.    Lakyn Alsteen J 07/21/2012  CC: No ref. provider found, No primary provider on file.

## 2012-07-21 NOTE — ED Notes (Signed)
[id number]<AV> Expected date:<BR> Expected time:<BR> Means of arrival:<BR> Comments:<BR> No monitor in the room

## 2012-07-21 NOTE — ED Provider Notes (Signed)
Patient screened on arrival from [facility]. She appears to be in no distress. VS stable. I discussed her situation with her parents, and the admitting surgeon was called.  Gerhard Munch, MD 07/21/12 [phone]

## 2012-07-21 NOTE — ED Notes (Signed)
Report given to [facility], transport not available until after 7pm

## 2012-07-21 NOTE — ED Notes (Signed)
ZOX:[id number]<AV> Expected date:07/21/12<BR> Expected time: 6:38 AM<BR> Means of arrival:Ambulance<BR> Comments:<BR> [facility]. 27 yr old, not pregnant, Call dr latent when pt arrives

## 2012-07-21 NOTE — ED Notes (Signed)
Diffuse abd pain x18 hours. Has gotten progressively worse.  Nausea and diarrhea but no vomiting.

## 2012-07-22 ENCOUNTER — Encounter (HOSPITAL_COMMUNITY): Payer: Self-pay | Admitting: Surgery

## 2012-07-22 MED ORDER — TRAMADOL HCL 50 MG PO TABS: 50.0000 mg | ORAL_TABLET | Freq: Four times a day (QID) | ORAL | Status: DC | PRN

## 2012-07-22 MED ORDER — TRAMADOL HCL 50 MG PO TABS
50.0000 mg | ORAL_TABLET | Freq: Four times a day (QID) | ORAL | Status: DC | PRN
  Administered 2012-07-22: 50 mg via ORAL
  Filled 2012-07-22: qty 1

## 2012-07-22 NOTE — Progress Notes (Signed)
1 Day Post-Op  Subjective: Pt sore this morning and frustrated she can't urinate on her own.  She hasn't had much to eat just Ginger ale.  Ambulating frequently.  Some flatus, no BM.  Objective: Vital signs in last 24 hours: Temp:  [97.9 F (36.6 C)-98.9 F (37.2 C)] 98.4 F (36.9 C) (05/23 0520) Pulse Rate:  [43-71] 58 (05/23 0520) Resp:  [10-20] 18 (05/23 0520) BP: (100-124)/(53-76) 112/76 mmHg (05/23 0520) SpO2:  [93 %-100 %] 97 % (05/23 0520) Weight:  [69.854 kg (154 lb)] 69.854 kg (154 lb) (05/22 1500) Last BM Date: 07/20/12  Intake/Output from previous day: 05/22 0701 - 05/23 0700 In: 2543.3 [I.V.:2543.3] Out: 500 [Urine:500] Intake/Output this shift:    PE: Gen:  Alert, NAD, pleasant Abd: Soft, appropriately tender, ND, +BS, no HSM, incisions C/D/I   Lab Results:   Recent Labs  07/21/12 0414 07/21/12 1537  WBC 10.7* 8.3  HGB 13.7 11.9*  HCT 40.1 35.9*  PLT 167 141*   BMET  Recent Labs  07/21/12 0414 07/21/12 1537  NA 139  --   K 4.2  --   CL 103  --   CO2 25  --   GLUCOSE 115*  --   BUN 19  --   CREATININE 0.80 0.71  CALCIUM 9.4  --    PT/INR No results found for this basename: LABPROT, INR,  in the last 72 hours CMP     Component Value Date/Time   NA 139 07/21/2012 0414   K 4.2 07/21/2012 0414   CL 103 07/21/2012 0414   CO2 25 07/21/2012 0414   GLUCOSE 115* 07/21/2012 0414   BUN 19 07/21/2012 0414   CREATININE 0.71 07/21/2012 1537   CALCIUM 9.4 07/21/2012 0414   PROT 7.0 07/21/2012 0414   ALBUMIN 4.0 07/21/2012 0414   AST 25 07/21/2012 0414   ALT 23 07/21/2012 0414   ALKPHOS 80 07/21/2012 0414   BILITOT 0.4 07/21/2012 0414   GFRNONAA >90 07/21/2012 1537   GFRAA >90 07/21/2012 1537   Lipase  No results found for this basename: lipase       Studies/Results: Ct Abdomen Pelvis W Contrast  07/21/2012   *RADIOLOGY REPORT*  Clinical Data: Right lower quadrant abdominal pain.  CT ABDOMEN AND PELVIS WITH CONTRAST  Technique:  Multidetector CT  imaging of the abdomen and pelvis was performed following the standard protocol during bolus administration of intravenous contrast.  Contrast: 50mL OMNIPAQUE IOHEXOL 300 MG/ML  SOLN, OMNIPAQUE IOHEXOL 300 MG/ML  SOLN  Comparison: None  Findings: The lung bases are clear.  No pleural effusion or pulmonary nodule.  The solid abdominal organs are normal.  The gallbladder is normal. No common bile duct dilatation.  The stomach, duodenum, small bowel and colon are unremarkable.  No inflammatory changes or mass lesions.  The appendix is mildly dilated and fluid-filled with mucosal enhancement and periappendiceal inflammatory change.  It is retrocecal and is coursing up along the liver edge.  Small appendicoliths are suspected.  The uterus and ovaries are normal.  No pelvic mass, adenopathy or significant free pelvic fluid collections.  The aorta is normal.  The branch vessels are normal.  No mesenteric or retroperitoneal mass or adenopathy.  IMPRESSION: CT findings consistent with acute appendicitis as discussed above.   Original Report Authenticated By: Rudie Meyer, M.D.    Anti-infectives: Anti-infectives   Start     Dose/Rate Route Frequency Ordered Stop   07/22/12 0600  ertapenem (INVANZ) 1 g in sodium chloride  0.9 % 50 mL IVPB     1 g 100 mL/hr over 30 Minutes Intravenous Every 24 hours 07/21/12 1501     07/21/12 0615  ertapenem (INVANZ) 1 g in sodium chloride 0.9 % 50 mL IVPB     1 g 100 mL/hr over 30 Minutes Intravenous  Once 07/21/12 1610 07/21/12 0650       Assessment/Plan Acute retrocecal appendicitis POD #1 s/p lap appendectomy Urinary retension Nausea 1.  Pain control/antibiotics, antiemetics, IVF, advance diet as tolerated 2.  In/out cath as needed, hopefully will start urinating on own later today 3.  Ambulate and IS 4.  SCD's and heparin 5.  D/C IV pain meds due to nausea start ultram 6.  May be able to go home this afternoon if improved if not tomorrow 7.  Made f/u  appt    LOS: 1 day    DORT, Suleman Gunning 07/22/2012, 7:35 AM Pager: [phone]

## 2012-07-22 NOTE — Discharge Summary (Signed)
Physician Discharge Summary  Patient ID: Lori Rice MRN: [id number] DOB/AGE: 1985-08-04 26 y.o.  Admit date: 07/21/2012 Discharge date: 07/22/2012  Admitting Diagnosis: Acute retrocecal appendicitis   Discharge Diagnosis Patient Active Problem List   Diagnosis Date Noted  . Retrocecal appendicitis 07/21/2012    Consultants None  Imaging: Ct Abdomen Pelvis W Contrast  07/21/2012   *RADIOLOGY REPORT*  Clinical Data: Right lower quadrant abdominal pain.  CT ABDOMEN AND PELVIS WITH CONTRAST  Technique:  Multidetector CT imaging of the abdomen and pelvis was performed following the standard protocol during bolus administration of intravenous contrast.  Contrast: 50mL OMNIPAQUE IOHEXOL 300 MG/ML  SOLN, OMNIPAQUE IOHEXOL 300 MG/ML  SOLN  Comparison: None  Findings: The lung bases are clear.  No pleural effusion or pulmonary nodule.  The solid abdominal organs are normal.  The gallbladder is normal. No common bile duct dilatation.  The stomach, duodenum, small bowel and colon are unremarkable.  No inflammatory changes or mass lesions.  The appendix is mildly dilated and fluid-filled with mucosal enhancement and periappendiceal inflammatory change.  It is retrocecal and is coursing up along the liver edge.  Small appendicoliths are suspected.  The uterus and ovaries are normal.  No pelvic mass, adenopathy or significant free pelvic fluid collections.  The aorta is normal.  The branch vessels are normal.  No mesenteric or retroperitoneal mass or adenopathy.  IMPRESSION: CT findings consistent with acute appendicitis as discussed above.   Original Report Authenticated By: Rudie Meyer, M.D.    Procedures Dr. Jamey Ripa [name]/**/14) - Laparoscopic Appendectomy  Hospital Course:  27 y.o. female who presents to [facility] for evaluation of RLQ pain. She felt some abdominal discomfort on 07/20/12 with some bloating. Over time it is centered more into the RLQ.  She has some nausea, as well as a little  diarrhea yesterday. It got worse last night and she presented to the ED. On evaluation she was noted to have RLQ tenderness with a slight elevation in her white cell count. A CT scan was confirmatory for appendicitis with what appears to be a retrocecal appendix with the tip close to the liver.  Patient was admitted and underwent procedure listed above.  Tolerated procedure well and was transferred to the floor.  She had some trouble with urinary retention after surgery required in and out cath.  Her IV pain medication was discontinued and she was able to urinate on her own.  Diet was advanced as tolerated.  On POD #1, the patient was voiding well, tolerating diet, ambulating well, pain well controlled, vital signs stable, incisions c/d/i and felt stable for discharge home.  Patient will follow up in our office in 2 weeks and knows to call with questions or concerns.     Medication List    STOP taking these medications       promethazine 25 MG suppository  Commonly known as:  PHENERGAN      TAKE these medications       aspirin-sod bicarb-citric acid 325 MG Tbef  Commonly known as:  ALKA-SELTZER  Take 325 mg by mouth every 6 (six) hours as needed (acid reflux).     HYDROMET PO  Take 5 mLs by mouth every 8 (eight) hours as needed.     ibuprofen 200 MG tablet  Commonly known as:  ADVIL,MOTRIN  Take 400 mg by mouth every 6 (six) hours as needed.     ibuprofen 200 MG tablet  Commonly known as:  ADVIL,MOTRIN  Take 200 mg by  mouth every 6 (six) hours as needed for pain (pain).     levofloxacin 500 MG tablet  Commonly known as:  LEVAQUIN  Take 500 mg by mouth daily.     traMADol 50 MG tablet  Commonly known as:  ULTRAM  Take 1-2 tablets (50-100 mg total) by mouth every 6 (six) hours as needed.             Follow-up Information   Follow up with [facility] On 08/02/2012. (APPT AT 1:15PM, PLEASE ARRIVE AT 12:45PM FOR CHECK IN)    Contact information:   [address]   [place] [address] [phone]       Signed: Candiss Norse [facility] [phone]  07/22/2012, 3:28 PM

## 2012-07-22 NOTE — Progress Notes (Signed)
Agree with A&P of MD,PA. Patient has been drowsy since surgery - hopefully this will improve with change in pain meds. Abd soft and hopefully will progress today with voiding and po intake

## 2012-07-29 ENCOUNTER — Encounter (INDEPENDENT_AMBULATORY_CARE_PROVIDER_SITE_OTHER): Payer: Self-pay | Admitting: *Deleted

## 2012-08-02 ENCOUNTER — Encounter (INDEPENDENT_AMBULATORY_CARE_PROVIDER_SITE_OTHER): Payer: Self-pay

## 2012-08-02 ENCOUNTER — Ambulatory Visit (INDEPENDENT_AMBULATORY_CARE_PROVIDER_SITE_OTHER): Payer: Managed Care, Other (non HMO) | Admitting: Internal Medicine

## 2012-08-02 VITALS — BP 122/64 | HR 60 | Temp 97.3°F | Resp 16 | Ht 66.0 in | Wt 158.0 lb

## 2012-08-02 DIAGNOSIS — K37 Unspecified appendicitis: Secondary | ICD-10-CM

## 2012-08-02 NOTE — Progress Notes (Signed)
  Subjective: Pt returns to the clinic today after undergoing laparoscopic appendectomy on 07/21/12 by Dr. Jamey Ripa for retrocecal appendicitis.  The patient is tolerating their diet well and is having no severe pain.  Bowel function is good.  No problems with the wounds.  Objective: Vital signs in last 24 hours: Reviewed  PE: Abd: soft, non-tender, +bs, incisions well healed  Lab Results:  No results found for this basename: WBC, HGB, HCT, PLT,  in the last 72 hours BMET No results found for this basename: NA, K, CL, CO2, GLUCOSE, BUN, CREATININE, CALCIUM,  in the last 72 hours PT/INR No results found for this basename: LABPROT, INR,  in the last 72 hours CMP     Component Value Date/Time   NA 139 07/21/2012 0414   K 4.2 07/21/2012 0414   CL 103 07/21/2012 0414   CO2 25 07/21/2012 0414   GLUCOSE 115* 07/21/2012 0414   BUN 19 07/21/2012 0414   CREATININE 0.71 07/21/2012 1537   CALCIUM 9.4 07/21/2012 0414   PROT 7.0 07/21/2012 0414   ALBUMIN 4.0 07/21/2012 0414   AST 25 07/21/2012 0414   ALT 23 07/21/2012 0414   ALKPHOS 80 07/21/2012 0414   BILITOT 0.4 07/21/2012 0414   GFRNONAA >90 07/21/2012 1537   GFRAA >90 07/21/2012 1537   Lipase  No results found for this basename: lipase       Studies/Results: No results found.  Anti-infectives: Anti-infectives   None       Assessment/Plan  1.  S/P Laparoscopic Appendectomy: doing well, may resume regular activity without restrictions, Pt will follow up with [place] [employer] and knows to call with questions or concerns.     Rice, Lori 08/02/2012

## 2012-08-02 NOTE — Patient Instructions (Addendum)
May resume regular activity without restrictions. Follow up as needed. Call with questions or concerns.  

## 2013-06-12 ENCOUNTER — Ambulatory Visit (INDEPENDENT_AMBULATORY_CARE_PROVIDER_SITE_OTHER): Payer: Managed Care, Other (non HMO) | Admitting: Internal Medicine

## 2013-06-12 ENCOUNTER — Encounter: Payer: Self-pay | Admitting: Internal Medicine

## 2013-06-12 VITALS — BP 128/68 | HR 84 | Temp 98.1°F | Resp 18 | Ht 65.5 in | Wt 161.6 lb

## 2013-06-12 DIAGNOSIS — R002 Palpitations: Secondary | ICD-10-CM

## 2013-06-12 NOTE — Progress Notes (Signed)
Subjective:     Patient ID: Lori Rice, female   DOB: 1985/11/19, 28 y.o.   MRN: [id number]  Anxiety Symptoms include nervous/anxious behavior and palpitations. Patient reports no chest pain, dizziness or nausea.    Palpitations  Pertinent negatives include no chest pain, coughing, diaphoresis, dizziness, fever, nausea or vomiting.   28 YO caucasian female [facility] with intermittent palpitations that have been going on for years but have gotten more frequent and worse over the last three months. They happen as much as 3-4 times per weak and can happen at any time during the day. She denies any chest pain or extreme shortness of breath during these episodes,says they will last 10-20 seconds and then go away on their own. She also endorses moderate anxiety during these episodes but feels that these episodes in addition to her busy schedule ar e what are making her anxious.  She states she has been training for and just ran a marathon, states she did not have any episodes of palpitations during her marathon but that has had palpitations during recent training runs.  She admits to a history of intermittent anxiety and depression but has not been taking medication for either for a very long time. At one point [place] in [place] her fluvoxamine.  Review of Systems  Constitutional: Negative for fever, chills, diaphoresis, activity change, appetite change and fatigue.  HENT: Negative for congestion, ear pain, hearing loss, rhinorrhea and sinus pressure.   Eyes: Negative for pain and itching.  Respiratory: Negative for cough, chest tightness and wheezing.        Occasional very mild shortness of breath with her palpitations that only lasts seconds  Cardiovascular: Positive for palpitations. Negative for chest pain and leg swelling.  Gastrointestinal: Negative for nausea, vomiting, abdominal pain, diarrhea, constipation, blood in stool and abdominal distention.   Genitourinary: Negative for dysuria, vaginal bleeding, difficulty urinating and pelvic pain.  Musculoskeletal: Negative for arthralgias, back pain, gait problem and myalgias.  Neurological: Negative for dizziness, syncope, light-headedness and headaches.       Mild weakness associated with palpitations   Psychiatric/Behavioral: Negative for behavioral problems, sleep disturbance and dysphoric mood. The patient is nervous/anxious.    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 She appears well-developed and well-nourished. No distress.  HENT:  Head: Normocephalic and atraumatic.  Eyes: Conjunctivae are normal. Pupils are equal, round, and reactive to light. No scleral icterus.  Cardiovascular: Normal rate, regular rhythm, normal heart sounds and intact distal pulses.  Exam reveals no gallop.   No murmur heard. No click  Pulmonary/Chest: Effort normal and breath sounds normal. No respiratory distress. She has no wheezes. She has no rales. She exhibits no tenderness.  Abdominal: Soft. Bowel sounds are normal. She exhibits no distension. There is no tenderness. There is no rebound and no guarding.  Musculoskeletal: Normal range of motion. She exhibits no edema.  Neurological: She is alert and oriented to person, place, and time. She has normal reflexes.  Skin: Skin is warm and dry. No rash noted. She is not diaphoretic. No erythema.  Psychiatric: She has a normal mood and affect. Her behavior is normal.      Assessment:    1) palpitations 2) anxiety     Plan:    1) reassured patient that this is unlikely a structural cardiac defect. Patient has follow up appointment with  cardiology on 07/08/13 for further w/u.[name]. Discussed anxiety associated with heart issue but patient believes she can handle her anxiety without the help of counseling or medications. Given the name of counselor if  she decides to change her mind.      I have completed the patient encounter in its entirety as documented by [facility]-Preesha Benjamin MS4, with editing by me where necessary. Chaunice Obie P. Merla Richesoolittle, M.D.

## 2013-06-19 ENCOUNTER — Encounter: Payer: Self-pay | Admitting: Cardiovascular Disease

## 2013-06-19 ENCOUNTER — Ambulatory Visit (INDEPENDENT_AMBULATORY_CARE_PROVIDER_SITE_OTHER): Payer: Managed Care, Other (non HMO) | Admitting: Cardiovascular Disease

## 2013-06-19 VITALS — BP 110/70 | HR 59 | Ht 66.0 in | Wt 161.0 lb

## 2013-06-19 DIAGNOSIS — R0989 Other specified symptoms and signs involving the circulatory and respiratory systems: Secondary | ICD-10-CM

## 2013-06-19 DIAGNOSIS — R0600 Dyspnea, unspecified: Secondary | ICD-10-CM | POA: Insufficient documentation

## 2013-06-19 DIAGNOSIS — R0609 Other forms of dyspnea: Secondary | ICD-10-CM

## 2013-06-19 DIAGNOSIS — R002 Palpitations: Secondary | ICD-10-CM

## 2013-06-19 NOTE — Patient Instructions (Signed)
Your physician recommends that you schedule a follow-up appointment in:  About 4-6 weeks.   Your physician has recommended that you wear a holter monitor. Holter monitors are medical devices that record the heart's electrical activity. Doctors most often use these monitors to diagnose arrhythmias. Arrhythmias are problems with the speed or rhythm of the heartbeat. The monitor is a small, portable device. You can wear one while you do your normal daily activities. This is usually used to diagnose what is causing palpitations/syncope (passing out).  Your physician has requested that you have an echocardiogram. Echocardiography is a painless test that uses sound waves to create images of your heart. It provides your doctor with information about the size and shape of your heart and how well your heart's chambers and valves are working. This procedure takes approximately one hour. There are no restrictions for this procedure.

## 2013-06-19 NOTE — Progress Notes (Signed)
     History of Present Illness: 28 yo female with history of anxiety who is referred today for evaluation of palpitations. She is a very active young female who runs frequently. I take care of her grandparents (The [employer] family). She is a Research scientist (physical sciences)singer/songwriter. She has no prior cardiac conditions.  She ran a marathon recently. She describes recent palpitations. This occurs with running and at rest. This feels like her heart is irregular. This has happened before while lifting weights. She has these episodes several times per month. She has associated dyspnea with her palpitations.   Primary Care Physician:    Past Medical History  Diagnosis Date  . Mononucleosis   . Anxiety   . Palpitations     Past Surgical History  Procedure Laterality Date  . Tonsillectomy    . Laparoscopic appendectomy N/A 07/21/2012    Procedure: APPENDECTOMY LAPAROSCOPIC;  Surgeon: Currie Parishristian J Streck, MD;  Location: [facility];  Service: General;  Laterality: N/A;    No current outpatient prescriptions on file.   No current facility-administered medications for this visit.    No Known Allergies  History   Social History  . Marital Status: Single    Spouse Name: N/A    Number of Children: 0  . Years of Education: N/A   Occupational History  . Singer/songwriter [employer]   Social History Main Topics  . Smoking status: Never Smoker   . Smokeless tobacco: Never Used  . Alcohol Use: Yes     Comment: 1-2 drinks per week  . Drug Use: No  . Sexual Activity: Yes    Birth Control/ Protection: None   Other Topics Concern  . Not on file   Social History Narrative  . No narrative on file    Family History  Problem Relation Age of Onset  . Cancer Mother     breast  . Cancer Paternal Grandfather     bladder  . CAD Maternal Grandmother   . CAD Paternal Grandfather     Review of Systems:  As stated in the HPI and otherwise negative.   BP 110/70  Pulse 59  Ht 5\' 6"  (1.676 m)  Wt 161 lb (73.029  kg)  BMI 26.00 kg/m2  LMP 05/22/2013  Physical Examination: General: Well developed, well nourished, NAD HEENT: OP clear, mucus membranes moist SKIN: warm, dry. No rashes. Neuro: No focal deficits Musculoskeletal: Muscle strength 5/5 all ext Psychiatric: Mood and affect normal Neck: No JVD, no carotid bruits, no thyromegaly, no lymphadenopathy. Lungs:Clear bilaterally, no wheezes, rhonci, crackles Cardiovascular: Regular rate and rhythm. No murmurs, gallops or rubs. Abdomen:Soft. Bowel sounds present. Non-tender.  Extremities: No lower extremity edema. Pulses are 2 + in the bilateral DP/PT.  EKG: Sinus, brady. Rate 59 bpm.   Assessment and Plan:   1. Palpitations: Likely premature beats. Will arrange 48 hour monitor to assess. Will check TSH and BMET.   2. Dyspnea: Will arrange echo to assess LVEF.

## 2013-06-20 LAB — BASIC METABOLIC PANEL
BUN: 13 mg/dL (ref 6–23)
CALCIUM: 9.3 mg/dL (ref 8.4–10.5)
CO2: 27 meq/L (ref 19–32)
Chloride: 107 mEq/L (ref 96–112)
Creatinine, Ser: 0.8 mg/dL (ref 0.4–1.2)
GFR: 87.57 mL/min (ref >60.00)
GLUCOSE: 83 mg/dL (ref 70–99)
Potassium: 4.1 mEq/L (ref 3.5–5.1)
SODIUM: 139 meq/L (ref 135–145)

## 2013-06-20 LAB — TSH: TSH: 0.88 u[IU]/mL (ref 0.35–5.50)

## 2013-06-21 ENCOUNTER — Ambulatory Visit: Payer: Managed Care, Other (non HMO) | Admitting: Cardiovascular Disease

## 2013-06-30 ENCOUNTER — Other Ambulatory Visit (HOSPITAL_COMMUNITY): Payer: Managed Care, Other (non HMO)

## 2013-07-06 ENCOUNTER — Other Ambulatory Visit (HOSPITAL_COMMUNITY): Payer: Managed Care, Other (non HMO)

## 2013-07-07 ENCOUNTER — Ambulatory Visit: Payer: Managed Care, Other (non HMO) | Admitting: Cardiovascular Disease

## 2013-07-07 ENCOUNTER — Encounter (HOSPITAL_COMMUNITY): Payer: Self-pay | Admitting: Cardiovascular Disease

## 2013-08-01 ENCOUNTER — Encounter: Payer: Self-pay | Admitting: *Deleted

## 2013-08-01 ENCOUNTER — Encounter (INDEPENDENT_AMBULATORY_CARE_PROVIDER_SITE_OTHER): Payer: Managed Care, Other (non HMO)

## 2013-08-01 DIAGNOSIS — R002 Palpitations: Secondary | ICD-10-CM

## 2013-08-01 NOTE — Progress Notes (Signed)
Patient ID: Lori Rice, female   DOB: [birth year], 28 y.o.   MRN: [id number] E-Cardio 48 hour holter monitor applied to patient.

## 2013-08-03 ENCOUNTER — Ambulatory Visit: Payer: Managed Care, Other (non HMO) | Admitting: Cardiovascular Disease

## 2013-08-21 ENCOUNTER — Ambulatory Visit (HOSPITAL_COMMUNITY): Payer: Managed Care, Other (non HMO) | Attending: Cardiology | Admitting: Radiology

## 2013-08-21 DIAGNOSIS — R002 Palpitations: Secondary | ICD-10-CM | POA: Insufficient documentation

## 2013-08-21 DIAGNOSIS — R0602 Shortness of breath: Secondary | ICD-10-CM

## 2013-08-21 DIAGNOSIS — I079 Rheumatic tricuspid valve disease, unspecified: Secondary | ICD-10-CM | POA: Insufficient documentation

## 2013-08-21 DIAGNOSIS — R0600 Dyspnea, unspecified: Secondary | ICD-10-CM

## 2013-08-21 DIAGNOSIS — I059 Rheumatic mitral valve disease, unspecified: Secondary | ICD-10-CM | POA: Insufficient documentation

## 2013-08-21 NOTE — Progress Notes (Signed)
Echocardiogram performed.  

## 2013-10-05 ENCOUNTER — Telehealth: Payer: Self-pay | Admitting: Cardiovascular Disease

## 2013-10-05 ENCOUNTER — Encounter: Payer: Managed Care, Other (non HMO) | Admitting: Cardiovascular Disease

## 2013-10-05 NOTE — Progress Notes (Signed)
No show

## 2013-10-05 NOTE — Telephone Encounter (Signed)
No show for appt today. cdm 

## 2014-03-30 IMAGING — CT CT ABD-PELV W/ CM
2 of 4 series · 16 of 46 positions shown, 18 images · IV contrast (APPLIED)
Comparison: None

CLINICAL DATA: Right lower quadrant abdominal pain.

CT ABDOMEN AND PELVIS WITH CONTRAST
TECHNIQUE: Multidetector CT imaging of the abdomen and pelvis was
performed following the standard protocol during bolus
administration of intravenous contrast.
Contrast: 50mL OMNIPAQUE IOHEXOL 300 MG/ML  SOLN, 100mL OMNIPAQUE
IOHEXOL 300 MG/ML  SOLN

[Series 2: abd/pelvis 5.0 b31f · axial · 0.73mm/px · z∈[+712,+1122]mm · 13 of 92 slices shown, 15 images]
[im 5/92  soft-tissue]
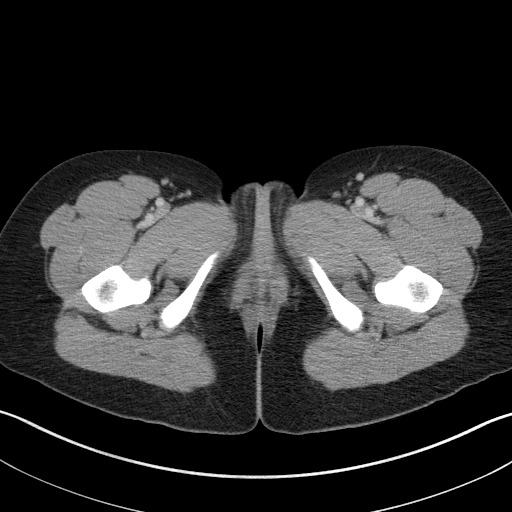
[im 5/92  bone]
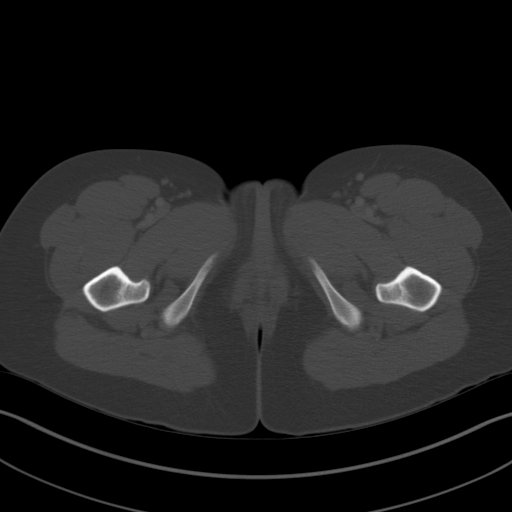
[im 13/92  soft-tissue]
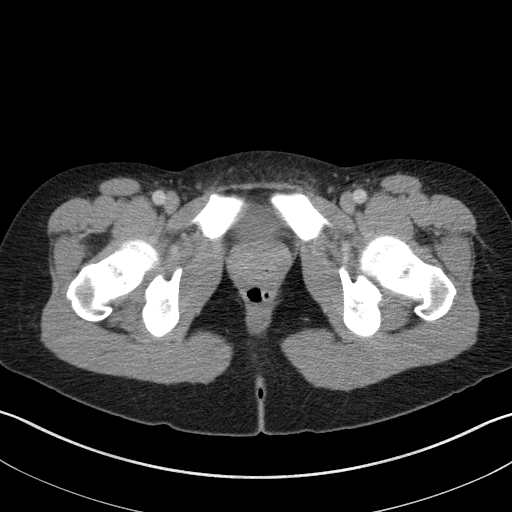
[im 21/92  soft-tissue]
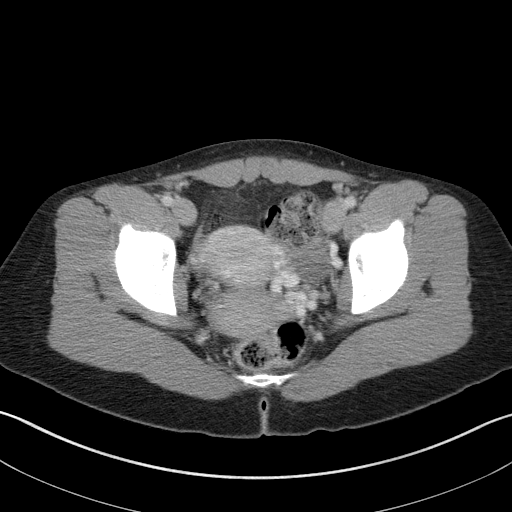
[im 25/92  soft-tissue]
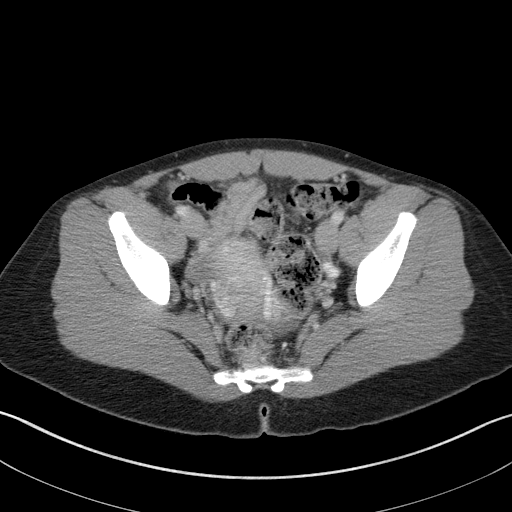
[im 34/92  soft-tissue]
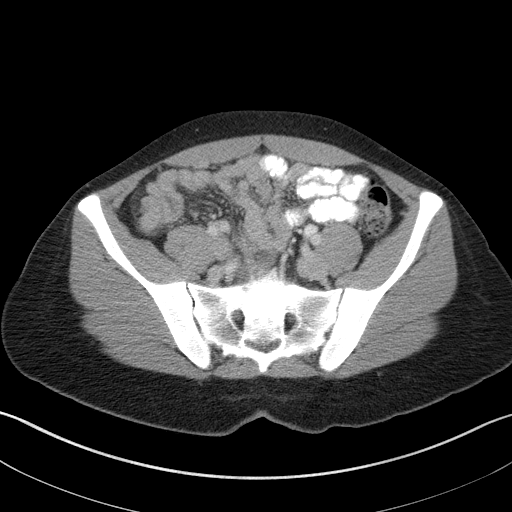
[im 38/92  soft-tissue]
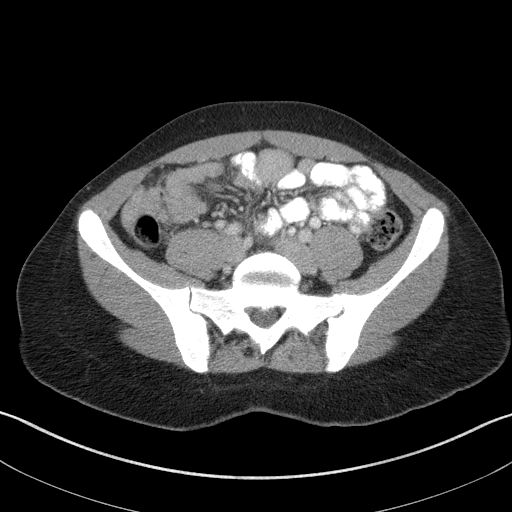
[im 46/92  soft-tissue]
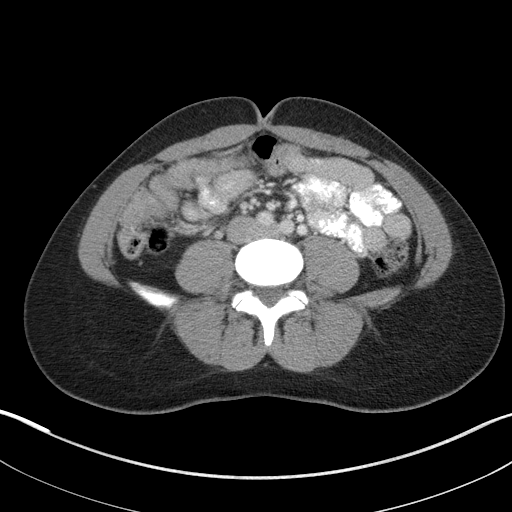
[im 54/92  soft-tissue]
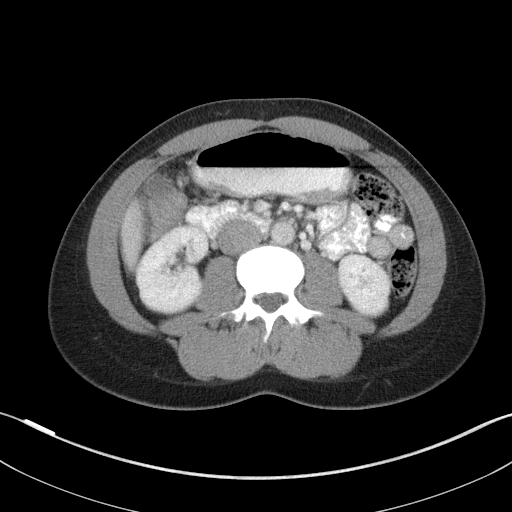
[im 58/92  soft-tissue]
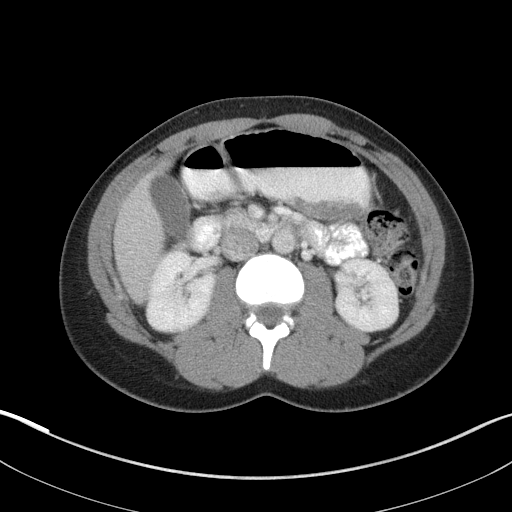
[im 58/92  bone]
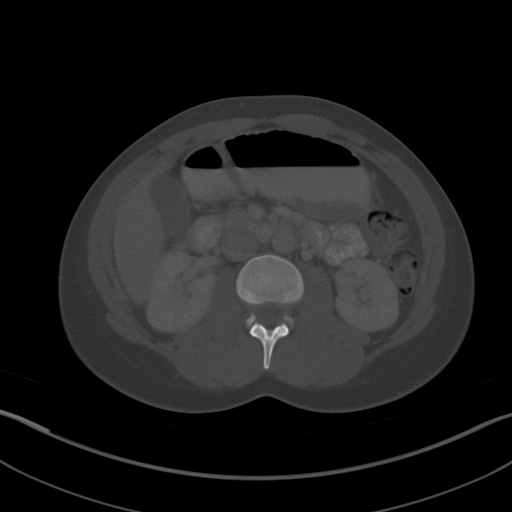
[im 67/92  soft-tissue]
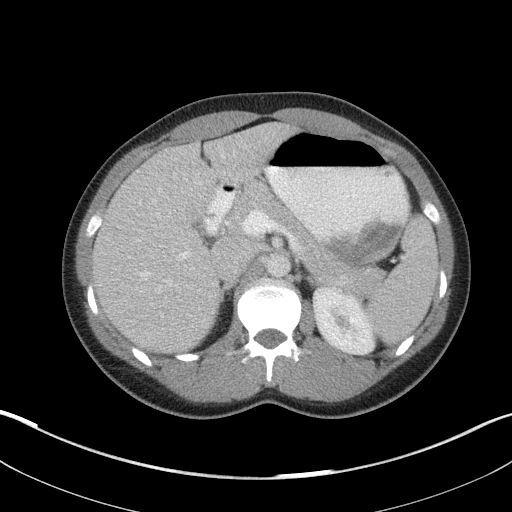
[im 71/92  soft-tissue]
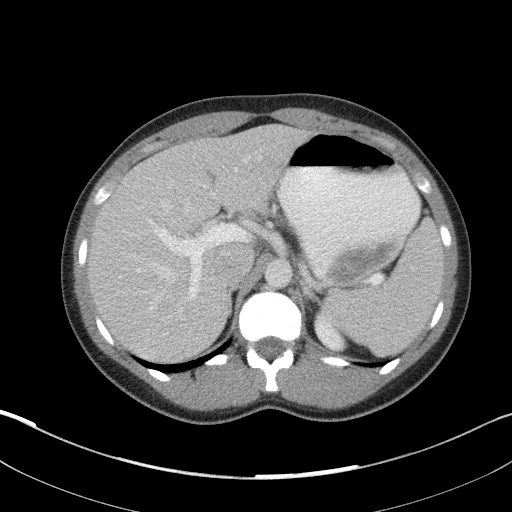
[im 79/92  soft-tissue]
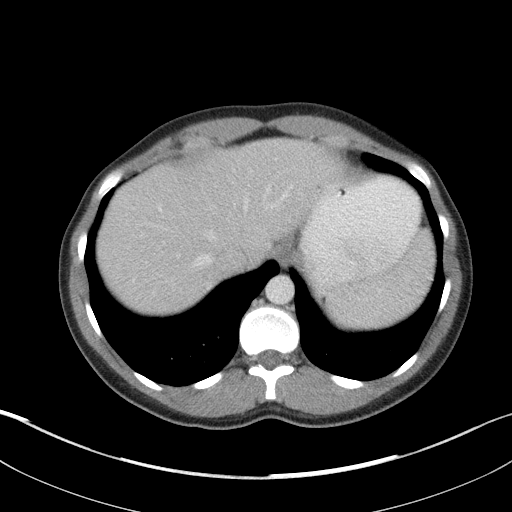
[im 87/92  soft-tissue]
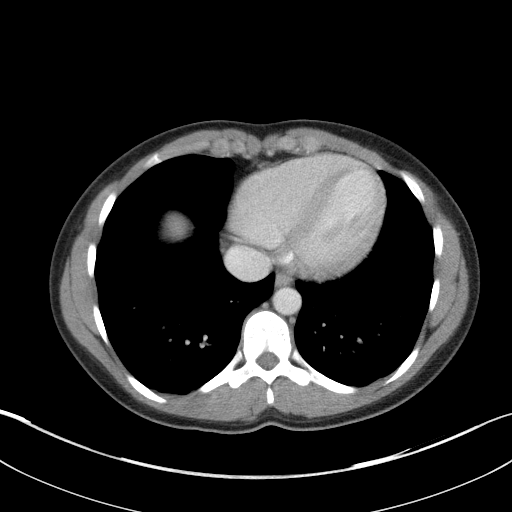

[Series 5: abd/pelvis 3.0 coronal · coronal · 0.73mm/px · 3 of 79 slices shown]
[im 27/79  soft-tissue]
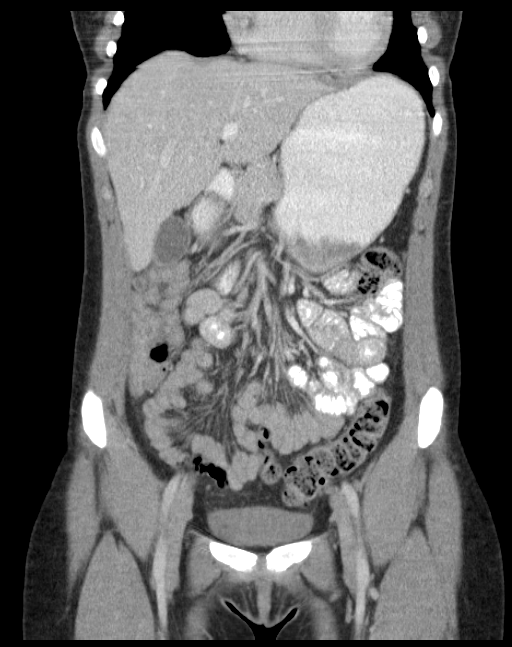
[im 35/79  soft-tissue]
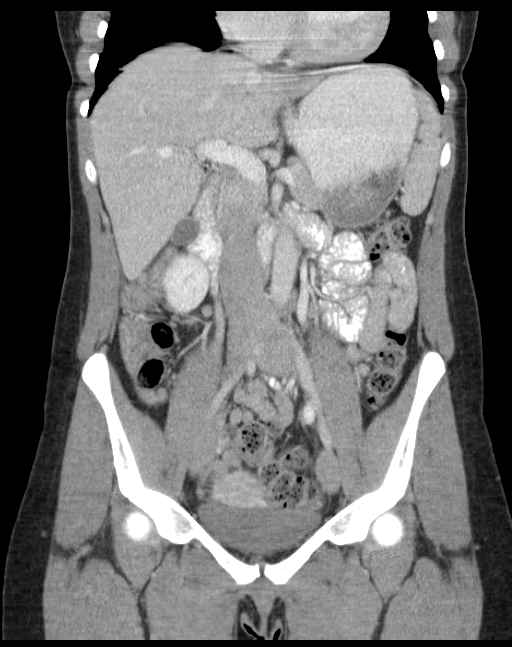
[im 44/79  soft-tissue]
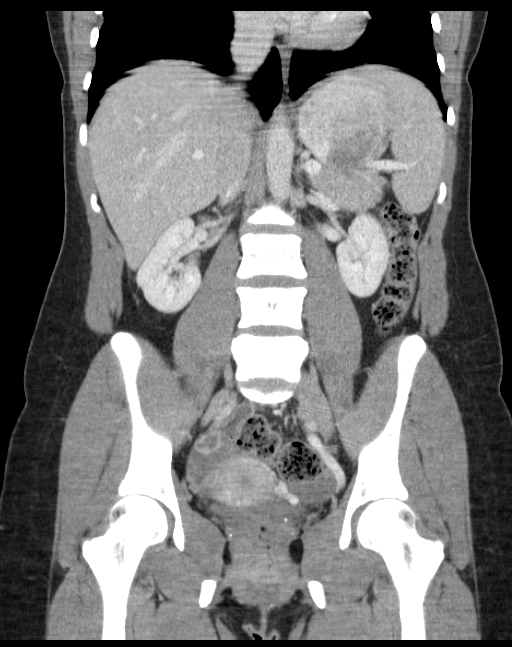

[16 of 46 positions shown; findings below may reference images not displayed]

FINDINGS: The lung bases are clear.  No pleural effusion or
pulmonary nodule.

The solid abdominal organs are normal.  The gallbladder is normal.
No common bile duct dilatation.

The stomach, duodenum, small bowel and colon are unremarkable.  No
inflammatory changes or mass lesions.  The appendix is mildly
dilated and fluid-filled with mucosal enhancement and
periappendiceal inflammatory change.  It is retrocecal and is
coursing up along the liver edge.  Small appendicoliths are
suspected.

The uterus and ovaries are normal.  No pelvic mass, adenopathy or
significant free pelvic fluid collections.

The aorta is normal.  The branch vessels are normal.  No mesenteric
or retroperitoneal mass or adenopathy.
IMPRESSION: CT findings consistent with acute appendicitis as discussed above.

## 2014-04-07 ENCOUNTER — Ambulatory Visit (INDEPENDENT_AMBULATORY_CARE_PROVIDER_SITE_OTHER): Payer: 59 | Admitting: Internal Medicine

## 2014-04-07 VITALS — BP 118/62 | HR 67 | Temp 98.7°F | Resp 18 | Ht 66.0 in | Wt 156.6 lb

## 2014-04-07 DIAGNOSIS — F338 Other recurrent depressive disorders: Secondary | ICD-10-CM

## 2014-04-07 DIAGNOSIS — F428 Other obsessive-compulsive disorder: Secondary | ICD-10-CM

## 2014-04-07 DIAGNOSIS — R55 Syncope and collapse: Secondary | ICD-10-CM

## 2014-04-07 DIAGNOSIS — F39 Unspecified mood [affective] disorder: Secondary | ICD-10-CM

## 2014-04-07 DIAGNOSIS — Z30011 Encounter for initial prescription of contraceptive pills: Secondary | ICD-10-CM

## 2014-04-07 DIAGNOSIS — T887XXA Unspecified adverse effect of drug or medicament, initial encounter: Secondary | ICD-10-CM

## 2014-04-07 DIAGNOSIS — T50905A Adverse effect of unspecified drugs, medicaments and biological substances, initial encounter: Secondary | ICD-10-CM

## 2014-04-07 DIAGNOSIS — F42 Obsessive-compulsive disorder: Secondary | ICD-10-CM

## 2014-04-07 MED ORDER — NORGESTIMATE-ETH ESTRADIOL 0.25-35 MG-MCG PO TABS: 1.0000 | ORAL_TABLET | Freq: Every day | ORAL | Status: DC

## 2014-04-07 MED ORDER — DULOXETINE HCL 20 MG PO CPEP
ORAL_CAPSULE | ORAL | Status: DC
Start: 2014-04-07 — End: 2014-05-17

## 2014-04-07 NOTE — Progress Notes (Addendum)
Subjective:  This chart was scribed for Ellamae Sia, MD by Haywood Pao, ED Scribe at [facility].The patient was seen in exam room 08 and the patient's care was started at 9:47 AM.   Patient ID: Lori Rice, female    DOB: April 15, 1985, 29 y.o.   MRN: [id number] Chief Complaint  Patient presents with  . Contraception    would like to be placed back on bc  . Medication Problems    Has noticed after long runs, feeling nauseated, having cold sweats, and feeling as if she is going to black out. Reports believing it to be due to medication. Has had 4 episodes in last month.   HPI HPI Comments: Lori Rice is a 29 y.o. female with a history of palpations and anxiety who presents to [facility] complaining of a near syncope episodes after long runs. Pt believes this is due to the Cymbalta. Pt has had 4 episodes this past month. She has nausea and cold sweats as associated symptoms. Pt states she feels "wiped out after her episodes" . She began taken Cymbalta in December per C Shugart/couns C Sarvis.  She began training for a marathon in January. After running the long runs 14-14mi, she has these near syncopal episodes followed by chills, nausea and finally a feeling of marked fatigue requiring bed rest for a "couple hours". After the first episode she had diarrhea and vomiting, but has not had that since. This past Wednesday she had an episode without exercise. She awoke with lower abd pain, described the pain as "really bad menstrual cramp", followed by nausea, chills, and tingling in her hands. No vomiting or diarrhea at the episode on Wednesday.  Pt still exercises- does run for a shorter period-and has no postexercise symptoms in the last few weeks.  Cymbalta was started for her depression and anxiety (as noted in past charts visits). She has a history of depression, usually during the darker months. Pt states her Cymbalta gives her very vivid dreams and nightmares, and  she wakes feeling unrested or. Feels fatigue during the day. Will fall asleep without effort at night. She feels like she looks sleep deprived. Unsure of snoring, no SOB when she wakes up. Last month has had more constipation, believes this is due to her medication. She has noticed a decreased urinary sensation, and she to remind herself to urinate.  She had an anxiety attack yesterday and she attributes it to what is going on with her physically. She states she does have obsessive  thinking, but does not do compulsive activity. She took a xanax for relief with success but tries not to do this very often. Has an appointment with her psychiatry PA next week for follow-up. Her therapy sessions have been helpful.   Note visits last spring: She had a cardiac evaluation last summer with an ECHO and Holter, at Dr. Gibson Ramp office-- told she had bradycardia in the 30s, with multiple prebeats but no sustained tachycardias or heart block.   Pt is would also like a prescription for birth control. Unsure of her last PAP smear but most likely 2 years ago and she has been normal every time. She is sexually active, 1 committed partner for a long time.   She has a FHx of anxiety and depression in her maternal grandmother who frequently will not leave the house.   Patient Active Problem List   Diagnosis Date Noted  . Palpitations 06/19/2013  . Dyspnea 06/19/2013  . Retrocecal  appendicitis 07/21/2012   Past Medical History  Diagnosis Date  . Mononucleosis   . Anxiety   . Palpitations    Past Surgical History  Procedure Laterality Date  . Tonsillectomy    . Laparoscopic appendectomy N/A 07/21/2012    Procedure: APPENDECTOMY LAPAROSCOPIC;  Surgeon: Currie Parishristian J Streck, MD;  Location: [facility];  Service: General;  Laterality: N/A;   No Known Allergies Prior to Admission medications   Medication Sig Start Date End Date Taking? Authorizing Provider  DULoxetine (CYMBALTA) 60 MG capsule Take 60 mg by mouth  daily.   Yes Historical Provider, MD   Review of Systems  Constitutional: Positive for chills, diaphoresis and fatigue.  Cardiovascular: Positive for palpitations.  Gastrointestinal: Positive for nausea and constipation.  Neurological: Positive for light-headedness.  Psychiatric/Behavioral: Positive for sleep disturbance and dysphoric mood. The patient is nervous/anxious.       Objective:  BP 118/62 mmHg  Pulse 67  Temp(Src) 98.7 F (37.1 C) (Oral)  Resp 18  Ht 5\' 6"  (1.676 m)  Wt 156 lb 9.6 oz (71.033 kg)  BMI 25.29 kg/m2  SpO2 98%  Physical Exam  Constitutional: She is oriented to person, place, and time. She appears well-developed and well-nourished. No distress.  HENT:  Head: Normocephalic and atraumatic.  Eyes: EOM are normal. Pupils are equal, round, and reactive to light.  Neck: Normal range of motion. No thyromegaly present.  Cardiovascular: Normal rate, regular rhythm, normal heart sounds and intact distal pulses.   No murmur heard. Pulmonary/Chest: Effort normal. No respiratory distress.  Musculoskeletal: Normal range of motion.  Neurological: She is alert and oriented to person, place, and time. She has normal reflexes. No cranial nerve deficit.  Skin: Skin is warm and dry.  Psychiatric: She has a normal mood and affect. Her behavior is normal. Judgment and thought content normal.  Nursing note and vitals reviewed.     Assessment & Plan:  Symptomatic bradycardia episodes secondary to long distance running plus Cymbalta in well conditioned athlete Obsessive thinking Seasonal affective disorder Anxiety and depression as secondary symptoms Sleep deprivation possibly secondary to Cymbalta Contraception   Referred to OCD for dummies Stop Cymbalta by weaning Focus on restorative sleep Referred to information on mindfulness Restart OCPs-Sprintec Follow-up 3 weeks  I have completed the patient encounter in its entirety as documented by the scribe, with editing  by me where necessary. Jaqulyn Chancellor P. Merla Richesoolittle, M.D.

## 2014-04-25 ENCOUNTER — Ambulatory Visit: Payer: 59 | Admitting: Internal Medicine

## 2014-05-02 ENCOUNTER — Ambulatory Visit: Payer: 59 | Admitting: Internal Medicine

## 2014-05-12 ENCOUNTER — Other Ambulatory Visit: Payer: Self-pay | Admitting: Internal Medicine

## 2014-05-15 ENCOUNTER — Telehealth: Payer: Self-pay | Admitting: Internal Medicine

## 2014-05-15 NOTE — Telephone Encounter (Signed)
Patient states that she her pharmacy faxed a refill request for her Cymblata. I see a note from 05/12/2014 that the refill for this medication has been refused because patient is supposed to be weaning off of it and should've had a follow up by now. Patient states that she missed her last appointment with Merla Richesoolittle because she was out of town for work (patient cancelled appointment from 05/02/2014 and was a no show for an appointment on 04/25/2014). Patient states that she went from 60mg  of Cymbalta to 20mg . (she was given 10 pills to help her wean off). Patient states that she has not been able to take any medicine today because she is completely out. She also states that she would like more than 10 pills for this refill since it is very difficult for her to wean off of the Cymbalta and she is experiencing side effects. Please advise. [employer] [place]  [phone]

## 2014-05-17 MED ORDER — DULOXETINE HCL 20 MG PO CPEP: ORAL_CAPSULE | ORAL | Status: DC

## 2014-05-17 NOTE — Telephone Encounter (Signed)
Sent in 15 capsules for pt and notified pt. She reported that she has been having a really hard time tapering off and has been having to split the capsules and take 1/2 QD instead of QOD. I advised that she REALLY needs to get in to see Dr Merla Richesoolittle and gave her his sch next week. Pt agreed.

## 2015-01-28 ENCOUNTER — Encounter: Payer: Self-pay | Admitting: Internal Medicine

## 2015-11-08 ENCOUNTER — Ambulatory Visit (INDEPENDENT_AMBULATORY_CARE_PROVIDER_SITE_OTHER): Payer: BLUE CROSS/BLUE SHIELD | Admitting: Family Medicine

## 2015-11-08 ENCOUNTER — Encounter: Payer: Self-pay | Admitting: Family Medicine

## 2015-11-08 VITALS — BP 108/66 | HR 65 | Temp 98.2°F | Resp 17 | Ht 66.5 in | Wt 161.0 lb

## 2015-11-08 DIAGNOSIS — F418 Other specified anxiety disorders: Secondary | ICD-10-CM | POA: Diagnosis not present

## 2015-11-08 DIAGNOSIS — F411 Generalized anxiety disorder: Secondary | ICD-10-CM | POA: Diagnosis not present

## 2015-11-08 DIAGNOSIS — S81801A Unspecified open wound, right lower leg, initial encounter: Secondary | ICD-10-CM | POA: Diagnosis not present

## 2015-11-08 DIAGNOSIS — T63461A Toxic effect of venom of wasps, accidental (unintentional), initial encounter: Secondary | ICD-10-CM | POA: Diagnosis not present

## 2015-11-08 DIAGNOSIS — Z23 Encounter for immunization: Secondary | ICD-10-CM | POA: Diagnosis not present

## 2015-11-08 MED ORDER — ALPRAZOLAM 0.25 MG PO TABS: 0.2500 mg | ORAL_TABLET | Freq: Three times a day (TID) | ORAL | 0 refills | Status: DC | PRN

## 2015-11-08 NOTE — Progress Notes (Signed)
Subjective:  By signing my name below, I, Lori Rice, attest that this documentation has been prepared under the direction and in the presence of Merri Ray, MD. Electronically Signed: Moises Rice, [place]. 11/08/2015 , 10:44 AM .  Patient was seen in Room 2 .   Patient ID: Lori Rice, female    DOB: 1985/08/22, 30 y.o.   MRN: [id number] Chief Complaint  Patient presents with  . Other    patient got stuck with a rusty nail and wants a tetanus shot    HPI Loxley Rice is a 30 y.o. female  Patient states she's been working on her deck. She accidentally dropped a board with some dirty nails, which scraped over her right leg. She started to bleed right away and went inside to take a shower. She washed over the area, and applied hydrogen peroxide. She believes her last tetanus was in 2008. She also has a cut over her left palm. She also notes at least 2 yellow jacket stings over her left leg while working in the yard.   She requests to receive a flu shot today as well.   She also mentions seeing a psychiatrist about 2 years ago and was prescribed cymbalta for anti-depressants and xanax. However, she had side effects with it so she weaned herself off cymbalta and has stopped. She takes occasional xanax, about once in >1 month. Her prescription bottle shows alprazolam 0.53m filled in 2015 by Dr. CClovis Pu still with pills in it. She has history of seasonal depression. She requests to have a refill of xanax just in case of breakthrough anxiety symptoms. She also agreed for [place] discard her medication: 8 white pills and 2 orange pills discarded.   Patient Active Problem List   Diagnosis Date Noted  . Palpitations 06/19/2013  . Dyspnea 06/19/2013  . Retrocecal appendicitis 07/21/2012   Past Medical History:  Diagnosis Date  . Anxiety   . Mononucleosis   . Palpitations    Past Surgical History:  Procedure Laterality Date  . LAPAROSCOPIC APPENDECTOMY N/A 07/21/2012   Procedure: APPENDECTOMY LAPAROSCOPIC;  Surgeon: CHaywood Lasso MD;  Location: [facility];  Service: General;  Laterality: N/A;  . TONSILLECTOMY     No Known Allergies Prior to Admission medications   Medication Sig Start Date End Date Taking? Authorizing Provider  ALPRAZolam (Duanne Moron 0.25 MG tablet  02/07/14  Yes Historical Provider, MD  SUMAtriptan (IMITREX) 50 MG tablet Take 50 mg by mouth every 2 (two) hours as needed for migraine. May repeat in 2 hours if headache persists or recurs.   Yes Historical Provider, MD  DULoxetine (CYMBALTA) 20 MG capsule Take 1 capsule by mouth qod Patient not taking: Reported on 11/08/2015 05/17/14   RLeandrew Koyanagi MD  norgestimate-ethinyl estradiol (ORTHO-CYCLEN,SPRINTEC,PREVIFEM) 0.25-35 MG-MCG tablet Take 1 tablet by mouth daily. Patient not taking: Reported on 11/08/2015 04/07/14   RLeandrew Koyanagi MD   Social History   Social History  . Marital status: Single    Spouse name: N/A  . Number of children: 0  . Years of education: N/A   Occupational History  . Singer/songwriter [employer]  Social History Main Topics  . Smoking status: Never Smoker  . Smokeless tobacco: Never Used  . Alcohol use Yes     Comment: 1-2 drinks per week  . Drug use: No  . Sexual activity: Yes    Birth control/ protection: None   Other Topics Concern  . Not on file   Social  History Narrative  . No narrative on file   Review of Systems  Constitutional: Negative for chills, diaphoresis, fatigue and fever.  Respiratory: Negative for cough, chest tightness, shortness of breath and wheezing.   Cardiovascular: Negative for chest pain.  Gastrointestinal: Negative for diarrhea, nausea and vomiting.  Skin: Positive for wound. Negative for rash.       Objective:   Physical Exam  Constitutional: She is oriented to person, place, and time. She appears well-developed and well-nourished. No distress.  HENT:  Head: Normocephalic and atraumatic.  Eyes: EOM are  normal. Pupils are equal, round, and reactive to light.  Neck: Neck supple.  Cardiovascular: Normal rate.   Pulmonary/Chest: Effort normal. No respiratory distress.  Musculoskeletal: Normal range of motion.  Neurological: She is alert and oriented to person, place, and time.  Skin: Skin is warm and dry.  3cm and 1cm superficial laceration over dorsal right thigh, no active bleeding, some surrounding bruising. Healing wound on the right lower anterior calf without surrounding erythema.  Small amount of bruising and healing wound over left thigh  Psychiatric: She has a normal mood and affect. Her behavior is normal.  Nursing note and vitals reviewed.   Vitals:   11/08/15 1014  BP: 108/66  Pulse: 65  Resp: 17  Temp: 98.2 F (36.8 C)  TempSrc: Oral  SpO2: 100%  Weight: 161 lb (73 kg)  Height: 5' 6.5" (1.689 m)      Assessment & Plan:    [name] is a 30 y.o. female Wound of right leg, initial encounter - Plan: Tdap vaccine greater than or equal to 7yo IM  - Superficial laceration/abrasion. No signs of infection. Continue cleansing with soap and water, Polysporin antibiotic is needed, Tdap given  Yellow jacket sting, accidental or unintentional, initial encounter  - Healing well. No signs of infection.  Flu vaccine need - Plan: Flu Vaccine QUAD 36+ mos IM given  Anxiety state  - Rare, intermittent symptoms. Overall has had significant improvement in the symptoms over the years. Previous Xanax prescription was from 2015, and still had some on use pills. These were disposed of in the office.  -Prescription for Xanax given #10 as infrequent use of approximately once per month. Stress and stress management techniques were given on handout and recheck in 6 months, sooner if increased need or worsening of sx's.   Medication list was updated, and she is no longer on contraception. Advised to return to discuss options if she would like to do so, but would prefer not to be on  contraception at this time.  Meds ordered this encounter  Medications  . SUMAtriptan (IMITREX) 50 MG tablet    Sig: Take 50 mg by mouth every 2 (two) hours as needed for migraine. May repeat in 2 hours if headache persists or recurs.  . ALPRAZolam (XANAX) 0.25 MG tablet    Sig: Take 1 tablet (0.25 mg total) by mouth 3 (three) times daily as needed for anxiety.    Dispense:  10 tablet    Refill:  0   Patient Instructions    Tetanus and flu vaccines were updated today. The wound on your leg appears to be okay right now. Cleanse with soap and water, Polysporin antibiotic as needed. See information below on abrasions.  I did refill the Xanax if needed for breakthrough anxiety symptoms, but follow up with me in 6 months to see how that is going. If you are running out of that medication prior to that time,  follow-up sooner. See information below on stress and stress management. Let me know if you have any questions in the meantime.  Abrasion An abrasion is a cut or scrape on the outer surface of your skin. An abrasion does not extend through all of the layers of your skin. It is important to care for your abrasion properly to prevent infection. CAUSES Most abrasions are caused by falling on or gliding across the ground or another surface. When your skin rubs on something, the outer and inner layer of skin rubs off.  SYMPTOMS A cut or scrape is the main symptom of this condition. The scrape may be bleeding, or it may appear red or pink. If there was an associated fall, there may be an underlying bruise. DIAGNOSIS An abrasion is diagnosed with a physical exam. TREATMENT Treatment for this condition depends on how large and deep the abrasion is. Usually, your abrasion will be cleaned with water and mild soap. This removes any dirt or debris that may be stuck. An antibiotic ointment may be applied to the abrasion to help prevent infection. A bandage (dressing) may be placed on the abrasion to keep  it clean. You may also need a tetanus shot. HOME CARE INSTRUCTIONS Medicines  Take or apply medicines only as directed by your health care provider.  If you were prescribed an antibiotic ointment, finish all of it even if you start to feel better. Wound Care  Clean the wound with mild soap and water 2-3 times per day or as directed by your health care provider. Pat your wound dry with a clean towel. Do not rub it.  There are many different ways to close and cover a wound. Follow instructions from your health care provider about:  Wound care.  Dressing changes and removal.  Check your wound every day for signs of infection. Watch for:  Redness, swelling, or pain.  Fluid, Rice, or pus. General Instructions  Keep the dressing dry as directed by your health care provider. Do not take baths, swim, use a hot tub, or do anything that would put your wound underwater until your health care provider approves.  If there is swelling, raise (elevate) the injured area above the level of your heart while you are sitting or lying down.  Keep all follow-up visits as directed by your health care provider. This is important. SEEK MEDICAL CARE IF:  You received a tetanus shot and you have swelling, severe pain, redness, or bleeding at the injection site.  Your pain is not controlled with medicine.  You have increased redness, swelling, or pain at the site of your wound. SEEK IMMEDIATE MEDICAL CARE IF:  You have a red streak going away from your wound.  You have a fever.  You have fluid, Rice, or pus coming from your wound.  You notice a bad smell coming from your wound or your dressing.   This information is not intended to replace advice given to you by your health care provider. Make sure you discuss any questions you have with your health care provider.   Document Released: 11/26/2004 Document Revised: 11/07/2014 Document Reviewed: 02/14/2014 Elsevier Interactive Patient Education  2016 [place] and Stress Management Stress is a normal reaction to life events. It is what you feel when life demands more than you are used to or more than you can handle. Some stress can be useful. For example, the stress reaction can help you catch the last bus of the day, study for  a test, or meet a deadline at work. But stress that occurs too often or for too long can cause problems. It can affect your emotional health and interfere with relationships and normal daily activities. Too much stress can weaken your immune system and increase your risk for physical illness. If you already have a medical problem, stress can make it worse. CAUSES  All sorts of life events may cause stress. An event that causes stress for one person may not be stressful for another person. Major life events commonly cause stress. These may be positive or negative. Examples include losing your job, moving into a new home, getting married, having a baby, or losing a loved one. Less obvious life events may also cause stress, especially if they occur day after day or in combination. Examples include working long hours, driving in traffic, caring for children, being in debt, or being in a difficult relationship. SIGNS AND SYMPTOMS Stress may cause emotional symptoms including, the following:  Anxiety. This is feeling worried, afraid, on edge, overwhelmed, or out of control.  Anger. This is feeling irritated or impatient.  Depression. This is feeling sad, down, helpless, or guilty.  Difficulty focusing, remembering, or making decisions. Stress may cause physical symptoms, including the following:   Aches and pains. These may affect your head, neck, back, stomach, or other areas of your body.  Tight muscles or clenched jaw.  Low energy or trouble sleeping. Stress may cause unhealthy behaviors, including the following:   Eating to feel better (overeating) or skipping meals.  Sleeping too little, too  much, or both.  Working too much or putting off tasks (procrastination).  Smoking, drinking alcohol, or using drugs to feel better. DIAGNOSIS  Stress is diagnosed through an assessment by your health care provider. Your health care provider will ask questions about your symptoms and any stressful life events.Your health care provider will also ask about your medical history and may order Rice tests or other tests. Certain medical conditions and medicine can cause physical symptoms similar to stress. Mental illness can cause emotional symptoms and unhealthy behaviors similar to stress. Your health care provider may refer you to a mental health professional for further evaluation.  TREATMENT  Stress management is the recommended treatment for stress.The goals of stress management are reducing stressful life events and coping with stress in healthy ways.  Techniques for reducing stressful life events include the following:  Stress identification. Self-monitor for stress and identify what causes stress for you. These skills may help you to avoid some stressful events.  Time management. Set your priorities, keep a calendar of events, and learn to say "no." These tools can help you avoid making too many commitments. Techniques for coping with stress include the following:  Rethinking the problem. Try to think realistically about stressful events rather than ignoring them or overreacting. Try to find the positives in a stressful situation rather than focusing on the negatives.  Exercise. Physical exercise can release both physical and emotional tension. The key is to find a form of exercise you enjoy and do it regularly.  Relaxation techniques. These relax the body and mind. Examples include yoga, meditation, tai chi, biofeedback, deep breathing, progressive muscle relaxation, listening to music, being out in nature, journaling, and other hobbies. Again, the key is to find one or more that you enjoy  and can do regularly.  Healthy lifestyle. Eat a balanced diet, get plenty of sleep, and do not smoke. Avoid using alcohol or drugs to relax.  Strong support network. Spend time with family, friends, or other people you enjoy being around.Express your feelings and talk things over with someone you trust. Counseling or talktherapy with a mental health professional may be helpful if you are having difficulty managing stress on your own. Medicine is typically not recommended for the treatment of stress.Talk to your health care provider if you think you need medicine for symptoms of stress. HOME CARE INSTRUCTIONS  Keep all follow-up visits as directed by your health care provider.  Take all medicines as directed by your health care provider. SEEK MEDICAL CARE IF:  Your symptoms get worse or you start having new symptoms.  You feel overwhelmed by your problems and can no longer manage them on your own. SEEK IMMEDIATE MEDICAL CARE IF:  You feel like hurting yourself or someone else.   This information is not intended to replace advice given to you by your health care provider. Make sure you discuss any questions you have with your health care provider.   Document Released: 08/12/2000 Document Revised: 03/09/2014 Document Reviewed: 10/11/2012 Elsevier Interactive Patient Education [employer].    IF you received an x-ray today, you will receive an invoice from [facility]. Please contact [facility] at [phone] with questions or concerns regarding your invoice.   IF you received labwork today, you will receive an invoice from [employer]. Please contact [facility] at [phone] with questions or concerns regarding your invoice.   Our billing staff will not be able to assist you with questions regarding bills from these companies.  You will be contacted with the lab results as soon as they are available. The fastest way to get your  results is to activate your My Chart account. Instructions are located on the last page of this paperwork. If you have not heard from [place] regarding the results in 2 weeks, please contact this office.        I personally performed the services described in this documentation, which was scribed in my presence. The recorded information has been reviewed and considered, and addended by me as needed.   Signed,   Merri Ray, MD [facility].  11/08/15 11:06 AM

## 2015-11-08 NOTE — Patient Instructions (Addendum)
Tetanus and flu vaccines were updated today. The wound on your leg appears to be okay right now. Cleanse with soap and water, Polysporin antibiotic as needed. See information below on abrasions.  I did refill the Xanax if needed for breakthrough anxiety symptoms, but follow up with me in 6 months to see how that is going. If you are running out of that medication prior to that time, follow-up sooner. See information below on stress and stress management. Let me know if you have any questions in the meantime.  Abrasion An abrasion is a cut or scrape on the outer surface of your skin. An abrasion does not extend through all of the layers of your skin. It is important to care for your abrasion properly to prevent infection. CAUSES Most abrasions are caused by falling on or gliding across the ground or another surface. When your skin rubs on something, the outer and inner layer of skin rubs off.  SYMPTOMS A cut or scrape is the main symptom of this condition. The scrape may be bleeding, or it may appear red or pink. If there was an associated fall, there may be an underlying bruise. DIAGNOSIS An abrasion is diagnosed with a physical exam. TREATMENT Treatment for this condition depends on how large and deep the abrasion is. Usually, your abrasion will be cleaned with water and mild soap. This removes any dirt or debris that may be stuck. An antibiotic ointment may be applied to the abrasion to help prevent infection. A bandage (dressing) may be placed on the abrasion to keep it clean. You may also need a tetanus shot. HOME CARE INSTRUCTIONS Medicines  Take or apply medicines only as directed by your health care provider.  If you were prescribed an antibiotic ointment, finish all of it even if you start to feel better. Wound Care  Clean the wound with mild soap and water 2-3 times per day or as directed by your health care provider. Pat your wound dry with a clean towel. Do not rub it.  There  are many different ways to close and cover a wound. Follow instructions from your health care provider about:  Wound care.  Dressing changes and removal.  Check your wound every day for signs of infection. Watch for:  Redness, swelling, or pain.  Fluid, blood, or pus. General Instructions  Keep the dressing dry as directed by your health care provider. Do not take baths, swim, use a hot tub, or do anything that would put your wound underwater until your health care provider approves.  If there is swelling, raise (elevate) the injured area above the level of your heart while you are sitting or lying down.  Keep all follow-up visits as directed by your health care provider. This is important. SEEK MEDICAL CARE IF:  You received a tetanus shot and you have swelling, severe pain, redness, or bleeding at the injection site.  Your pain is not controlled with medicine.  You have increased redness, swelling, or pain at the site of your wound. SEEK IMMEDIATE MEDICAL CARE IF:  You have a red streak going away from your wound.  You have a fever.  You have fluid, blood, or pus coming from your wound.  You notice a bad smell coming from your wound or your dressing.   This information is not intended to replace advice given to you by your health care provider. Make sure you discuss any questions you have with your health care provider.   Document Released:  11/26/2004 Document Revised: 11/07/2014 Document Reviewed: 02/14/2014 Elsevier Interactive Patient Education 2016 [place] and Stress Management Stress is a normal reaction to life events. It is what you feel when life demands more than you are used to or more than you can handle. Some stress can be useful. For example, the stress reaction can help you catch the last bus of the day, study for a test, or meet a deadline at work. But stress that occurs too often or for too long can cause problems. It can affect your emotional  health and interfere with relationships and normal daily activities. Too much stress can weaken your immune system and increase your risk for physical illness. If you already have a medical problem, stress can make it worse. CAUSES  All sorts of life events may cause stress. An event that causes stress for one person may not be stressful for another person. Major life events commonly cause stress. These may be positive or negative. Examples include losing your job, moving into a new home, getting married, having a baby, or losing a loved one. Less obvious life events may also cause stress, especially if they occur day after day or in combination. Examples include working long hours, driving in traffic, caring for children, being in debt, or being in a difficult relationship. SIGNS AND SYMPTOMS Stress may cause emotional symptoms including, the following:  Anxiety. This is feeling worried, afraid, on edge, overwhelmed, or out of control.  Anger. This is feeling irritated or impatient.  Depression. This is feeling sad, down, helpless, or guilty.  Difficulty focusing, remembering, or making decisions. Stress may cause physical symptoms, including the following:   Aches and pains. These may affect your head, neck, back, stomach, or other areas of your body.  Tight muscles or clenched jaw.  Low energy or trouble sleeping. Stress may cause unhealthy behaviors, including the following:   Eating to feel better (overeating) or skipping meals.  Sleeping too little, too much, or both.  Working too much or putting off tasks (procrastination).  Smoking, drinking alcohol, or using drugs to feel better. DIAGNOSIS  Stress is diagnosed through an assessment by your health care provider. Your health care provider will ask questions about your symptoms and any stressful life events.Your health care provider will also ask about your medical history and may order blood tests or other tests. Certain  medical conditions and medicine can cause physical symptoms similar to stress. Mental illness can cause emotional symptoms and unhealthy behaviors similar to stress. Your health care provider may refer you to a mental health professional for further evaluation.  TREATMENT  Stress management is the recommended treatment for stress.The goals of stress management are reducing stressful life events and coping with stress in healthy ways.  Techniques for reducing stressful life events include the following:  Stress identification. Self-monitor for stress and identify what causes stress for you. These skills may help you to avoid some stressful events.  Time management. Set your priorities, keep a calendar of events, and learn to say "no." These tools can help you avoid making too many commitments. Techniques for coping with stress include the following:  Rethinking the problem. Try to think realistically about stressful events rather than ignoring them or overreacting. Try to find the positives in a stressful situation rather than focusing on the negatives.  Exercise. Physical exercise can release both physical and emotional tension. The key is to find a form of exercise you enjoy and do it regularly.  Relaxation techniques. These relax the body and mind. Examples include yoga, meditation, tai chi, biofeedback, deep breathing, progressive muscle relaxation, listening to music, being out in nature, journaling, and other hobbies. Again, the key is to find one or more that you enjoy and can do regularly.  Healthy lifestyle. Eat a balanced diet, get plenty of sleep, and do not smoke. Avoid using alcohol or drugs to relax.  Strong support network. Spend time with family, friends, or other people you enjoy being around.Express your feelings and talk things over with someone you trust. Counseling or talktherapy with a mental health professional may be helpful if you are having difficulty managing stress  on your own. Medicine is typically not recommended for the treatment of stress.Talk to your health care provider if you think you need medicine for symptoms of stress. HOME CARE INSTRUCTIONS  Keep all follow-up visits as directed by your health care provider.  Take all medicines as directed by your health care provider. SEEK MEDICAL CARE IF:  Your symptoms get worse or you start having new symptoms.  You feel overwhelmed by your problems and can no longer manage them on your own. SEEK IMMEDIATE MEDICAL CARE IF:  You feel like hurting yourself or someone else.   This information is not intended to replace advice given to you by your health care provider. Make sure you discuss any questions you have with your health care provider.   Document Released: 08/12/2000 Document Revised: 03/09/2014 Document Reviewed: 10/11/2012 Elsevier Interactive Patient Education [employer].    IF you received an x-ray today, you will receive an invoice from [facility]. Please contact [facility] at [phone] with questions or concerns regarding your invoice.   IF you received labwork today, you will receive an invoice from [employer]. Please contact [facility] at [phone] with questions or concerns regarding your invoice.   Our billing staff will not be able to assist you with questions regarding bills from these companies.  You will be contacted with the lab results as soon as they are available. The fastest way to get your results is to activate your My Chart account. Instructions are located on the last page of this paperwork. If you have not heard from [place] regarding the results in 2 weeks, please contact this office.

## 2015-11-19 ENCOUNTER — Ambulatory Visit (INDEPENDENT_AMBULATORY_CARE_PROVIDER_SITE_OTHER): Payer: BLUE CROSS/BLUE SHIELD | Admitting: Certified Nurse Midwife

## 2015-11-19 ENCOUNTER — Encounter: Payer: Self-pay | Admitting: Certified Nurse Midwife

## 2015-11-19 VITALS — BP 106/70 | HR 68 | Resp 16 | Ht 66.0 in | Wt 163.0 lb

## 2015-11-19 DIAGNOSIS — Z01419 Encounter for gynecological examination (general) (routine) without abnormal findings: Secondary | ICD-10-CM | POA: Diagnosis not present

## 2015-11-19 DIAGNOSIS — Z124 Encounter for screening for malignant neoplasm of cervix: Secondary | ICD-10-CM

## 2015-11-19 DIAGNOSIS — N912 Amenorrhea, unspecified: Secondary | ICD-10-CM

## 2015-11-19 LAB — POCT URINE PREGNANCY: PREG TEST UR: NEGATIVE

## 2015-11-19 NOTE — Progress Notes (Signed)
30 y.o. G0P0000 Single  Caucasian Fe here to re-establish gyn care.  for annual exam. Periods normal for her every 6 weeks, with some cramping. No missed periods in past year. No STD screening needed. Had appendectomy in 2014, no problems. Also evaluated for PVC's by Cardiology all benign per patient. Sees PCP for prn. Migraines have decreased, has Imitrex if needed.  Patient's last menstrual period was 10/09/2015 (exact date).          Sexually active: Yes.    The current method of family planning is condoms all the time.    Exercising: Yes.    running & strength training, yardwork Smoker:  no  Health Maintenance: Pap:  2013 neg MMG:  none Colonoscopy:  none BMD:   none TDaP:  2017 Shingles: no Pneumonia: no Hep C and HIV: not done Labs: upt neg Self breast exam: done occ   reports that she has never smoked. She has never used smokeless tobacco. She reports that she drinks about 1.2 - 1.8 oz of alcohol per week . She reports that she does not use drugs.  Past Medical History:  Diagnosis Date  . Anxiety   . Bradycardia   . Depression   . Migraines   . Mononucleosis   . Palpitations     Past Surgical History:  Procedure Laterality Date  . LAPAROSCOPIC APPENDECTOMY N/A 07/21/2012   Procedure: APPENDECTOMY LAPAROSCOPIC;  Surgeon: Currie Parishristian J Streck, MD;  Location: [facility];  Service: General;  Laterality: N/A;  . TONSILLECTOMY    . TONSILLECTOMY      Current Outpatient Prescriptions  Medication Sig Dispense Refill  . ALPRAZolam (XANAX) 0.25 MG tablet Take 1 tablet (0.25 mg total) by mouth 3 (three) times daily as needed for anxiety. (Patient not taking: Reported on 11/19/2015) 10 tablet 0  . SUMAtriptan (IMITREX) 50 MG tablet Take 50 mg by mouth every 2 (two) hours as needed for migraine. May repeat in 2 hours if headache persists or recurs.     No current facility-administered medications for this visit.     Family History  Problem Relation Age of Onset  . Cancer Mother      breast  . Cancer Paternal Grandfather     bladder  . CAD Paternal Grandfather   . CAD Maternal Grandmother     ROS:  Pertinent items are noted in HPI.  Otherwise, a comprehensive ROS was negative.  Exam:   BP 106/70   Pulse 68   Resp 16   Ht 5\' 6"  (1.676 m)   Wt 163 lb (73.9 kg)   LMP 10/09/2015 (Exact Date)   BMI 26.31 kg/m  Height: 5\' 6"  (167.6 cm) Ht Readings from Last 3 Encounters:  11/19/15 5\' 6"  (1.676 m)  11/08/15 5' 6.5" (1.689 m)  04/07/14 5\' 6"  (1.676 m)    General appearance: alert, cooperative and appears stated age Head: Normocephalic, without obvious abnormality, atraumatic Neck: no adenopathy, supple, symmetrical, trachea midline and thyroid normal to inspection and palpation Lungs: clear to auscultation bilaterally Breasts: normal appearance, no masses or tenderness, No nipple retraction or dimpling, No nipple discharge or bleeding, No axillary or supraclavicular adenopathy Heart: regular rate and rhythm Abdomen: soft, non-tender; no masses,  no organomegaly Extremities: extremities normal, atraumatic, no cyanosis or edema Skin: Skin color, texture, turgor normal. No rashes or lesions Lymph nodes: Cervical, supraclavicular, and axillary nodes normal. No abnormal inguinal nodes palpated Neurologic: Grossly normal   Pelvic: External genitalia:  no lesions  Urethra:  normal appearing urethra with no masses, tenderness or lesions              Bartholin's and Skene's: normal                 Vagina: normal appearing vagina with normal color and discharge, no lesions              Cervix: no cervical motion tenderness, no lesions and nulliparous appearance              Pap taken: Yes.   Bimanual Exam:  Uterus:  normal size, contour, position, consistency, mobility, non-tender              Adnexa: normal adnexa and no mass, fullness, tenderness               Rectovaginal: Confirms               Anus:  normal sphincter tone, no lesions  Chaperone  present: yes  A:  Well Woman with normal exam  Contraception consistent condom use   Migraine Headaches/Palpitations with MD management  Family history of Breast cancer mother age 24  P:   Reviewed health and wellness pertinent to exam  Reviewed warning signs with Migraine headaches and advise. Follow up with cardiology as needed.  Discussed importance of self breast exam monthly and need for mammograms beginning at age 89   Pap smear as above with HPVH reflex   counseled on STD prevention, HIV risk factors and prevention, adequate intake of calcium and vitamin D, diet and exercise  return annually or prn  An After Visit Summary was printed and given to the patient.

## 2015-11-19 NOTE — Patient Instructions (Signed)
General topics  Next pap or exam is  due in 1 year Take a Women's multivitamin Take 1200 mg. of calcium daily - prefer dietary If any concerns in interim to call back  Breast Self-Awareness Practicing breast self-awareness may pick up problems early, prevent significant medical complications, and possibly save your life. By practicing breast self-awareness, you can become familiar with how your breasts look and feel and if your breasts are changing. This allows you to notice changes early. It can also offer you some reassurance that your breast health is good. One way to learn what is normal for your breasts and whether your breasts are changing is to do a breast self-exam. If you find a lump or something that was not present in the past, it is best to contact your caregiver right away. Other findings that should be evaluated by your caregiver include nipple discharge, especially if it is bloody; skin changes or reddening; areas where the skin seems to be pulled in (retracted); or new lumps and bumps. Breast pain is seldom associated with cancer (malignancy), but should also be evaluated by a caregiver. BREAST SELF-EXAM The best time to examine your breasts is 5 7 days after your menstrual period is over.  ExitCare Patient Information 2013 [employer].   Exercise to Stay Healthy Exercise helps you become and stay healthy. EXERCISE IDEAS AND TIPS Choose exercises that:  You enjoy.  Fit into your day. You do not need to exercise really hard to be healthy. You can do exercises at a slow or medium level and stay healthy. You can:  Stretch before and after working out.  Try yoga, Pilates, or tai chi.  Lift weights.  Walk fast, swim, jog, run, climb stairs, bicycle, dance, or rollerskate.  Take aerobic classes. Exercises that burn about 150 calories:  Running 1  miles in 15 minutes.  Playing volleyball for 45 to 60 minutes.  Washing and waxing a car for 45 to 60  minutes.  Playing touch football for 45 minutes.  Walking 1  miles in 35 minutes.  Pushing a stroller 1  miles in 30 minutes.  Playing basketball for 30 minutes.  Raking leaves for 30 minutes.  Bicycling 5 miles in 30 minutes.  Walking 2 miles in 30 minutes.  Dancing for 30 minutes.  Shoveling snow for 15 minutes.  Swimming laps for 20 minutes.  Walking up stairs for 15 minutes.  Bicycling 4 miles in 15 minutes.  Gardening for 30 to 45 minutes.  Jumping rope for 15 minutes.  Washing windows or floors for 45 to 60 minutes. Document Released: 03/21/2010 Document Revised: 05/11/2011 Document Reviewed: 03/21/2010 [employer] Patient Information 2013 [employer].   Other topics ( that may be useful information):    Sexually Transmitted Disease Sexually transmitted disease (STD) refers to any infection that is passed from person to person during sexual activity. This may happen by way of saliva, semen, blood, vaginal mucus, or urine. Common STDs include:  Gonorrhea.  Chlamydia.  Syphilis.  HIV/AIDS.  Genital herpes.  Hepatitis B and C.  Trichomonas.  Human papillomavirus (HPV).  Pubic lice. CAUSES  An STD may be spread by bacteria, virus, or parasite. A person can get an STD by:  Sexual intercourse with an infected person.  Sharing sex toys with an infected person.  Sharing needles with an infected person.  Having intimate contact with the genitals, mouth, or rectal areas of an infected person. SYMPTOMS  Some people may not have any symptoms, but   they can still pass the infection to others. Different STDs have different symptoms. Symptoms include:  Painful or bloody urination.  Pain in the pelvis, abdomen, vagina, anus, throat, or eyes.  Skin rash, itching, irritation, growths, or sores (lesions). These usually occur in the genital or anal area.  Abnormal vaginal discharge.  Penile discharge in men.  Soft, flesh-colored skin growths in the  genital or anal area.  Fever.  Pain or bleeding during sexual intercourse.  Swollen glands in the groin area.  Yellow skin and eyes (jaundice). This is seen with hepatitis. DIAGNOSIS  To make a diagnosis, your caregiver may:  Take a medical history.  Perform a physical exam.  Take a specimen (culture) to be examined.  Examine a sample of discharge under a microscope.  Perform blood test TREATMENT   Chlamydia, gonorrhea, trichomonas, and syphilis can be cured with antibiotic medicine.  Genital herpes, hepatitis, and HIV can be treated, but not cured, with prescribed medicines. The medicines will lessen the symptoms.  Genital warts from HPV can be treated with medicine or by freezing, burning (electrocautery), or surgery. Warts may come back.  HPV is a virus and cannot be cured with medicine or surgery.However, abnormal areas may be followed very closely by your caregiver and may be removed from the cervix, vagina, or vulva through office procedures or surgery. If your diagnosis is confirmed, your recent sexual partners need treatment. This is true even if they are symptom-free or have a negative culture or evaluation. They should not have sex until their caregiver says it is okay. HOME CARE INSTRUCTIONS  All sexual partners should be informed, tested, and treated for all STDs.  Take your antibiotics as directed. Finish them even if you start to feel better.  Only take over-the-counter or prescription medicines for pain, discomfort, or fever as directed by your caregiver.  Rest.  Eat a balanced diet and drink enough fluids to keep your urine clear or pale yellow.  Do not have sex until treatment is completed and you have followed up with your caregiver. STDs should be checked after treatment.  Keep all follow-up appointments, Pap tests, and blood tests as directed by your caregiver.  Only use latex condoms and water-soluble lubricants during sexual activity. Do not use  petroleum jelly or oils.  Avoid alcohol and illegal drugs.  Get vaccinated for HPV and hepatitis. If you have not received these vaccines in the past, talk to your caregiver about whether one or both might be right for you.  Avoid risky sex practices that can break the skin. The only way to avoid getting an STD is to avoid all sexual activity.Latex condoms and dental dams (for oral sex) will help lessen the risk of getting an STD, but will not completely eliminate the risk. SEEK MEDICAL CARE IF:   You have a fever.  You have any new or worsening symptoms. Document Released: 05/09/2002 Document Revised: 05/11/2011 Document Reviewed: 05/16/2010 [facility] Patient Information 2013 [employer].    Domestic Abuse You are being battered or abused if someone close to you hits, pushes, or physically hurts you in any way. You also are being abused if you are forced into activities. You are being sexually abused if you are forced to have sexual contact of any kind. You are being emotionally abused if you are made to feel worthless or if you are constantly threatened. It is important to remember that help is available. No one has the right to abuse you. PREVENTION OF FURTHER  ABUSE  Learn the warning signs of danger. This varies with situations but may include: the use of alcohol, threats, isolation from friends and family, or forced sexual contact. Leave if you feel that violence is going to occur.  If you are attacked or beaten, report it to the police so the abuse is documented. You do not have to press charges. The police can protect you while you or the attackers are leaving. Get the officer's name and badge number and a copy of the report.  Find someone you can trust and tell them what is happening to you: your caregiver, a nurse, clergy member, close friend or family member. Feeling ashamed is natural, but remember that you have done nothing wrong. No one deserves abuse. Document Released:  02/14/2000 Document Revised: 05/11/2011 Document Reviewed: 04/24/2010 [employer] Patient Information 2013 [employer].    How Much is Too Much Alcohol? Drinking too much alcohol can cause injury, accidents, and health problems. These types of problems can include:   Car crashes.  Falls.  Family fighting (domestic violence).  Drowning.  Fights.  Injuries.  Burns.  Damage to certain organs.  Having a baby with birth defects. ONE DRINK CAN BE TOO MUCH WHEN YOU ARE:  Working.  Pregnant or breastfeeding.  Taking medicines. Ask your doctor.  Driving or planning to drive. If you or someone you know has a drinking problem, get help from a doctor.  Document Released: 12/13/2008 Document Revised: 05/11/2011 Document Reviewed: 12/13/2008 [employer] Patient Information 2013 [employer].   Smoking Hazards Smoking cigarettes is extremely bad for your health. Tobacco smoke has over 200 known poisons in it. There are over 60 chemicals in tobacco smoke that cause cancer. Some of the chemicals found in cigarette smoke include:   Cyanide.  Benzene.  Formaldehyde.  Methanol (wood alcohol).  Acetylene (fuel used in welding torches).  Ammonia. Cigarette smoke also contains the poisonous gases nitrogen oxide and carbon monoxide.  Cigarette smokers have an increased risk of many serious medical problems and Smoking causes approximately:  90% of all lung cancer deaths in men.  80% of all lung cancer deaths in women.  90% of deaths from chronic obstructive lung disease. Compared with nonsmokers, smoking increases the risk of:  Coronary heart disease by 2 to 4 times.  Stroke by 2 to 4 times.  Men developing lung cancer by 23 times.  Women developing lung cancer by 13 times.  Dying from chronic obstructive lung diseases by 12 times.  . Smoking is the most preventable cause of death and disease in our society.  WHY IS SMOKING ADDICTIVE?  Nicotine is the chemical  agent in tobacco that is capable of causing addiction or dependence.  When you smoke and inhale, nicotine is absorbed rapidly into the bloodstream through your lungs. Nicotine absorbed through the lungs is capable of creating a powerful addiction. Both inhaled and non-inhaled nicotine may be addictive.  Addiction studies of cigarettes and spit tobacco show that addiction to nicotine occurs mainly during the teen years, when young people begin using tobacco products. WHAT ARE THE BENEFITS OF QUITTING?  There are many health benefits to quitting smoking.   Likelihood of developing cancer and heart disease decreases. Health improvements are seen almost immediately.  Blood pressure, pulse rate, and breathing patterns start returning to normal soon after quitting. QUITTING SMOKING   American Lung Association - 1-800-LUNGUSA  American Cancer Society - 1-800-ACS-2345 Document Released: 03/26/2004 Document Revised: 05/11/2011 Document Reviewed: 11/28/2008 Exit[employer] Patient Information 2013 [employer].   Stress Management Stress is a state of physical or mental tension that often results from changes in your life or normal routine. Some common causes of stress are:  Death of a loved one.  Injuries or severe illnesses.  Getting fired or changing jobs.  Moving into a new home. Other causes may be:  Sexual problems.  Business or financial losses.  Taking on a large debt.  Regular conflict with someone at home or at work.  Constant tiredness from lack of sleep. It is not just bad things that are stressful. It may be stressful to:  Win the lottery.  Get married.  Buy a new car. The amount of stress that can be easily tolerated varies from person to person. Changes generally cause stress, regardless of the types of change. Too much stress can affect your health. It may lead to physical or emotional problems. Too little stress (boredom) may also become stressful. SUGGESTIONS TO  REDUCE STRESS:  Talk things over with your family and friends. It often is helpful to share your concerns and worries. If you feel your problem is serious, you may want to get help from a professional counselor.  Consider your problems one at a time instead of lumping them all together. Trying to take care of everything at once may seem impossible. List all the things you need to do and then start with the most important one. Set a goal to accomplish 2 or 3 things each day. If you expect to do too many in a single day you will naturally fail, causing you to feel even more stressed.  Do not use alcohol or drugs to relieve stress. Although you may feel better for a short time, they do not remove the problems that caused the stress. They can also be habit forming.  Exercise regularly - at least 3 times per week. Physical exercise can help to relieve that "uptight" feeling and will relax you.  The shortest distance between despair and hope is often a good night's sleep.  Go to bed and get up on time allowing yourself time for appointments without being rushed.  Take a short "time-out" period from any stressful situation that occurs during the day. Close your eyes and take some deep breaths. Starting with the muscles in your face, tense them, hold it for a few seconds, then relax. Repeat this with the muscles in your neck, shoulders, hand, stomach, back and legs.  Take good care of yourself. Eat a balanced diet and get plenty of rest.  Schedule time for having fun. Take a break from your daily routine to relax. HOME CARE INSTRUCTIONS   Call if you feel overwhelmed by your problems and feel you can no longer manage them on your own.  Return immediately if you feel like hurting yourself or someone else. Document Released: 08/12/2000 Document Revised: 05/11/2011 Document Reviewed: 04/04/2007 Exit[employer] Patient Information 2013 [employer].   

## 2015-11-20 LAB — IPS PAP TEST WITH REFLEX TO HPV

## 2015-11-24 NOTE — Progress Notes (Signed)
Encounter reviewed Jill Jertson, MD   

## 2016-03-23 ENCOUNTER — Ambulatory Visit (INDEPENDENT_AMBULATORY_CARE_PROVIDER_SITE_OTHER): Payer: BLUE CROSS/BLUE SHIELD | Admitting: Family Medicine

## 2016-03-23 VITALS — BP 100/66 | HR 67 | Temp 98.1°F | Resp 18 | Ht 66.0 in | Wt 158.0 lb

## 2016-03-23 DIAGNOSIS — G43009 Migraine without aura, not intractable, without status migrainosus: Secondary | ICD-10-CM

## 2016-03-23 DIAGNOSIS — Z5181 Encounter for therapeutic drug level monitoring: Secondary | ICD-10-CM | POA: Diagnosis not present

## 2016-03-23 MED ORDER — SUMATRIPTAN SUCCINATE 50 MG PO TABS: 50.0000 mg | ORAL_TABLET | Freq: Once | ORAL | 1 refills | Status: DC

## 2016-03-23 MED ORDER — TOPIRAMATE 25 MG PO TABS
25.0000 mg | ORAL_TABLET | Freq: Every day | ORAL | 1 refills | Status: DC
Start: 2016-03-23 — End: 2017-12-01

## 2016-03-23 NOTE — Progress Notes (Signed)
Subjective:  By signing my name below, I, Essence Howell, attest that this documentation has been prepared under the direction and in the presence of Shade Flood, MD Electronically Signed: Charline Bills, ED Scribe 03/23/2016 at 9:00 AM.   Patient ID: Lori Rice, female    DOB: 1985-05-12, 31 y.o.   MRN: [id number]  Chief Complaint  Patient presents with  . Migraine   HPI Lori Rice is a 31 y.o. female who presents to Primary Care at [facility] complaining of recurrent HAs. She was treated at [facility] May 2017. At that time having migraine HAs twice a month with associated nausea and light sensitivity. She was advised to take Imitrex and Aleve as well as keep a HA journal. Reviewed outside records.   Today, she has noticed that migraines are similar to previous ones but have become more frequent. She reports 3-4 migraines so far this month which have lasted for at least 4 days. Pt states that HAs tend to start in the back of her head and radiate to both ears. She reports occasional nausea with migraines and occasional light sensitivity prior to onset of migraines. Pt denies h/o HAs prior to 2 years ago. She has taken Imitrex to treat migraines but reports side effects of neck and jaw tightness. Pt denies weakness in her upper or lower extremities and depression. She does report some increased stress at this time which she attributes to getting married in September. Pt has never seen a HA specialist for migraines.   Patient Active Problem List   Diagnosis Date Noted  . Situational anxiety 11/08/2015  . Palpitations 06/19/2013  . Dyspnea 06/19/2013  . Retrocecal appendicitis 07/21/2012   Past Medical History:  Diagnosis Date  . Anxiety   . Bradycardia   . Depression   . Migraines   . Mononucleosis   . Palpitations    Past Surgical History:  Procedure Laterality Date  . LAPAROSCOPIC APPENDECTOMY N/A 07/21/2012   Procedure: APPENDECTOMY  LAPAROSCOPIC;  Surgeon: Currie Paris, MD;  Location: [facility];  Service: General;  Laterality: N/A;  . TONSILLECTOMY    . TONSILLECTOMY     No Known Allergies Prior to Admission medications   Medication Sig Start Date End Date Taking? Authorizing Provider  ALPRAZolam (XANAX) 0.25 MG tablet Take 1 tablet (0.25 mg total) by mouth 3 (three) times daily as needed for anxiety. 11/08/15  Yes Shade Flood, MD  SUMAtriptan (IMITREX) 50 MG tablet Take 50 mg by mouth every 2 (two) hours as needed for migraine. May repeat in 2 hours if headache persists or recurs.    Historical Provider, MD   Social History   Social History  . Marital status: Single    Spouse name: N/A  . Number of children: 0  . Years of education: N/A   Occupational History  . Singer/songwriter [employer]   Social History Main Topics  . Smoking status: Never Smoker  . Smokeless tobacco: Never Used  . Alcohol use 1.2 - 1.8 oz/week    2 - 3 Standard drinks or equivalent per week  . Drug use: No  . Sexual activity: Yes    Partners: Male    Birth control/ protection: None, Condom   Other Topics Concern  . Not on file   Social History Narrative  . No narrative on file   Review of Systems  Neurological: Positive for headaches. Negative for weakness.  Psychiatric/Behavioral: Negative for dysphoric mood.  Objective:   Physical Exam  Constitutional: She is oriented to person, place, and time. She appears well-developed and well-nourished.  HENT:  Head: Normocephalic and atraumatic.  Eyes: Conjunctivae and EOM are normal. Pupils are equal, round, and reactive to light. Right eye exhibits no nystagmus. Left eye exhibits no nystagmus.  Neck: Carotid bruit is not present.  Cardiovascular: Normal rate, regular rhythm, normal heart sounds and intact distal pulses.   Pulmonary/Chest: Effort normal and breath sounds normal.  Abdominal: Soft. She exhibits no pulsatile midline mass. There is no tenderness.    Neurological: She is alert and oriented to person, place, and time. She displays a negative Romberg sign.  No pronator drift. Normal heel to toe. Normal finger to nose.  Skin: Skin is warm and dry.  Psychiatric: She has a normal mood and affect. Her behavior is normal.  Vitals reviewed.  Vitals:   03/23/16 0843  BP: 100/66  Pulse: 67  Resp: 18  Temp: 98.1 F (36.7 C)  TempSrc: Oral  SpO2: 100%  Weight: 158 lb (71.7 kg)  Height: 5\' 6"  (1.676 m)      Assessment & Plan:    Lori Rice is a 31 y.o. female Migraine without aura and without status migrainosus, not intractable - Plan: topiramate (TOPAMAX) 25 MG tablet, SUMAtriptan (IMITREX) 50 MG tablet, AMB referral to headache clinic, Care order/instruction:  Medication monitoring encounter - Plan: topiramate (TOPAMAX) 25 MG tablet, Basic metabolic panel  Persistent migraine, typical symptoms of previous migraines without concerning findings on neurologic exam, decided against neuroimaging at this time.  -Start Topamax 25 mg daily, then increase to twice a day after 1 week if tolerated. Temp-continue Imitrex for acute treatment of migraines, and refer to headache specialist to discuss other options/treatments. RTC precautions given.   Meds ordered this encounter  Medications  . topiramate (TOPAMAX) 25 MG tablet    Sig: Take 1 tablet (25 mg total) by mouth daily. Can increase to twice per day after 1 week.    Dispense:  30 tablet    Refill:  1  . SUMAtriptan (IMITREX) 50 MG tablet    Sig: Take 1 tablet (50 mg total) by mouth once. May repeat in 2 hours if headache persists or recurs.    Dispense:  10 tablet    Refill:  1   Patient Instructions    Start Topamax once per day, then can increase to twice per day after 1 week. Imitrex if needed. I will refer you to the headache specialist. Return to the clinic or go to the nearest emergency room if any of your symptoms worsen or new symptoms occur.   Migraine Headache A  migraine headache is an intense, throbbing pain on one side or both sides of the head. Migraines may also cause other symptoms, such as nausea, vomiting, and sensitivity to light and noise. What are the causes? Doing or taking certain things may also trigger migraines, such as:  Alcohol.  Smoking.  Medicines, such as:  Medicine used to treat chest pain (nitroglycerine).  Birth control pills.  Estrogen pills.  Certain blood pressure medicines.  Aged cheeses, chocolate, or caffeine.  Foods or drinks that contain nitrates, glutamate, aspartame, or tyramine.  Physical activity. Other things that may trigger a migraine include:  Menstruation.  Pregnancy.  Hunger.  Stress, lack of sleep, too much sleep, or fatigue.  Weather changes. What increases the risk? The following factors may make you more likely to experience migraine headaches:  Age. Risk increases with  age.  Family history of migraine headaches.  Being Caucasian.  Depression and anxiety.  Obesity.  Being a woman.  Having a hole in the heart (patent foramen ovale) or other heart problems. What are the signs or symptoms? The main symptom of this condition is pulsating or throbbing pain. Pain may:  Happen in any area of the head, such as on one side or both sides.  Interfere with daily activities.  Get worse with physical activity.  Get worse with exposure to bright lights or loud noises. Other symptoms may include:  Nausea.  Vomiting.  Dizziness.  General sensitivity to bright lights, loud noises, or smells. Before you get a migraine, you may get warning signs that a migraine is developing (aura). An aura may include:  Seeing flashing lights or having blind spots.  Seeing bright spots, halos, or zigzag lines.  Having tunnel vision or blurred vision.  Having numbness or a tingling feeling.  Having trouble talking.  Having muscle weakness. How is this diagnosed? A migraine headache  can be diagnosed based on:  Your symptoms.  A physical exam.  Tests, such as CT scan or MRI of the head. These imaging tests can help rule out other causes of headaches.  Taking fluid from the spine (lumbar puncture) and analyzing it (cerebrospinal fluid analysis, or CSF analysis). How is this treated? A migraine headache is usually treated with medicines that:  Relieve pain.  Relieve nausea.  Prevent migraines from coming back. Treatment may also include:  Acupuncture.  Lifestyle changes like avoiding foods that trigger migraines. Follow these instructions at home: Medicines  Take over-the-counter and prescription medicines only as told by your health care provider.  Do not drive or use heavy machinery while taking prescription pain medicine.  To prevent or treat constipation while you are taking prescription pain medicine, your health care provider may recommend that you:  Drink enough fluid to keep your urine clear or pale yellow.  Take over-the-counter or prescription medicines.  Eat foods that are high in fiber, such as fresh fruits and vegetables, whole grains, and beans.  Limit foods that are high in fat and processed sugars, such as fried and sweet foods. Lifestyle  Avoid alcohol use.  Do not use any products that contain nicotine or tobacco, such as cigarettes and e-cigarettes. If you need help quitting, ask your health care provider.  Get at least 8 hours of sleep every night.  Limit your stress. General instructions  Keep a journal to find out what may trigger your migraine headaches. For example, write down:  What you eat and drink.  How much sleep you get.  Any change to your diet or medicines.  If you have a migraine:  Avoid things that make your symptoms worse, such as bright lights.  It may help to lie down in a dark, quiet room.  Do not drive or use heavy machinery.  Ask your health care provider what activities are safe for you while  you are experiencing symptoms.  Keep all follow-up visits as told by your health care provider. This is important. Contact a health care provider if:  You develop symptoms that are different or more severe than your usual migraine symptoms. Get help right away if:  Your migraine becomes severe.  You have a fever.  You have a stiff neck.  You have vision loss.  Your muscles feel weak or like you cannot control them.  You start to lose your balance often.  You develop trouble  walking.  You faint. This information is not intended to replace advice given to you by your health care provider. Make sure you discuss any questions you have with your health care provider. Document Released: 02/16/2005 Document Revised: 09/06/2015 Document Reviewed: 08/05/2015 Elsevier Interactive Patient Education  2017 [employer].    IF you received an x-ray today, you will receive an invoice from [facility]. Please contact [facility] at [phone] with questions or concerns regarding your invoice.   IF you received labwork today, you will receive an invoice from [place]. Please contact [facility] at [phone] with questions or concerns regarding your invoice.   Our billing staff will not be able to assist you with questions regarding bills from these companies.  You will be contacted with the lab results as soon as they are available. The fastest way to get your results is to activate your My Chart account. Instructions are located on the last page of this paperwork. If you have not heard from [place] regarding the results in 2 weeks, please contact this office.      I personally performed the services described in this documentation, which was scribed in my presence. The recorded information has been reviewed and considered, and addended by me as needed.   Signed,   Meredith Staggers, MD Primary Care at [facility].  03/23/16 2:54 PM

## 2016-03-23 NOTE — Patient Instructions (Addendum)
Start Topamax once per day, then can increase to twice per day after 1 week. Imitrex if needed. I will refer you to the headache specialist. Return to the clinic or go to the nearest emergency room if any of your symptoms worsen or new symptoms occur.   Migraine Headache A migraine headache is an intense, throbbing pain on one side or both sides of the head. Migraines may also cause other symptoms, such as nausea, vomiting, and sensitivity to light and noise. What are the causes? Doing or taking certain things may also trigger migraines, such as:  Alcohol.  Smoking.  Medicines, such as:  Medicine used to treat chest pain (nitroglycerine).  Birth control pills.  Estrogen pills.  Certain blood pressure medicines.  Aged cheeses, chocolate, or caffeine.  Foods or drinks that contain nitrates, glutamate, aspartame, or tyramine.  Physical activity. Other things that may trigger a migraine include:  Menstruation.  Pregnancy.  Hunger.  Stress, lack of sleep, too much sleep, or fatigue.  Weather changes. What increases the risk? The following factors may make you more likely to experience migraine headaches:  Age. Risk increases with age.  Family history of migraine headaches.  Being Caucasian.  Depression and anxiety.  Obesity.  Being a woman.  Having a hole in the heart (patent foramen ovale) or other heart problems. What are the signs or symptoms? The main symptom of this condition is pulsating or throbbing pain. Pain may:  Happen in any area of the head, such as on one side or both sides.  Interfere with daily activities.  Get worse with physical activity.  Get worse with exposure to bright lights or loud noises. Other symptoms may include:  Nausea.  Vomiting.  Dizziness.  General sensitivity to bright lights, loud noises, or smells. Before you get a migraine, you may get warning signs that a migraine is developing (aura). An aura may  include:  Seeing flashing lights or having blind spots.  Seeing bright spots, halos, or zigzag lines.  Having tunnel vision or blurred vision.  Having numbness or a tingling feeling.  Having trouble talking.  Having muscle weakness. How is this diagnosed? A migraine headache can be diagnosed based on:  Your symptoms.  A physical exam.  Tests, such as CT scan or MRI of the head. These imaging tests can help rule out other causes of headaches.  Taking fluid from the spine (lumbar puncture) and analyzing it (cerebrospinal fluid analysis, or CSF analysis). How is this treated? A migraine headache is usually treated with medicines that:  Relieve pain.  Relieve nausea.  Prevent migraines from coming back. Treatment may also include:  Acupuncture.  Lifestyle changes like avoiding foods that trigger migraines. Follow these instructions at home: Medicines  Take over-the-counter and prescription medicines only as told by your health care provider.  Do not drive or use heavy machinery while taking prescription pain medicine.  To prevent or treat constipation while you are taking prescription pain medicine, your health care provider may recommend that you:  Drink enough fluid to keep your urine clear or pale yellow.  Take over-the-counter or prescription medicines.  Eat foods that are high in fiber, such as fresh fruits and vegetables, whole grains, and beans.  Limit foods that are high in fat and processed sugars, such as fried and sweet foods. Lifestyle  Avoid alcohol use.  Do not use any products that contain nicotine or tobacco, such as cigarettes and e-cigarettes. If you need help quitting, ask your health care  provider.  Get at least 8 hours of sleep every night.  Limit your stress. General instructions  Keep a journal to find out what may trigger your migraine headaches. For example, write down:  What you eat and drink.  How much sleep you get.  Any  change to your diet or medicines.  If you have a migraine:  Avoid things that make your symptoms worse, such as bright lights.  It may help to lie down in a dark, quiet room.  Do not drive or use heavy machinery.  Ask your health care provider what activities are safe for you while you are experiencing symptoms.  Keep all follow-up visits as told by your health care provider. This is important. Contact a health care provider if:  You develop symptoms that are different or more severe than your usual migraine symptoms. Get help right away if:  Your migraine becomes severe.  You have a fever.  You have a stiff neck.  You have vision loss.  Your muscles feel weak or like you cannot control them.  You start to lose your balance often.  You develop trouble walking.  You faint. This information is not intended to replace advice given to you by your health care provider. Make sure you discuss any questions you have with your health care provider. Document Released: 02/16/2005 Document Revised: 09/06/2015 Document Reviewed: 08/05/2015 Elsevier Interactive Patient Education  2017 [employer].    IF you received an x-ray today, you will receive an invoice from [facility]. Please contact [facility] at [phone] with questions or concerns regarding your invoice.   IF you received labwork today, you will receive an invoice from [place]. Please contact [facility] at [phone] with questions or concerns regarding your invoice.   Our billing staff will not be able to assist you with questions regarding bills from these companies.  You will be contacted with the lab results as soon as they are available. The fastest way to get your results is to activate your My Chart account. Instructions are located on the last page of this paperwork. If you have not heard from [place] regarding the results in 2 weeks, please contact this office.

## 2016-03-24 LAB — BASIC METABOLIC PANEL
BUN / CREAT RATIO: 16 (ref 9–23)
BUN: 13 mg/dL (ref 6–20)
CALCIUM: 8.9 mg/dL (ref 8.7–10.2)
CO2: 23 mmol/L (ref 18–29)
Chloride: 104 mmol/L (ref 96–106)
Creatinine, Ser: 0.82 mg/dL (ref 0.57–1.00)
GFR calc Af Amer: 113 mL/min/{1.73_m2} (ref >59)
GFR, EST NON AFRICAN AMERICAN: 98 mL/min/{1.73_m2} (ref >59)
Glucose: 86 mg/dL (ref 65–99)
Potassium: 4.4 mmol/L (ref 3.5–5.2)
Sodium: 141 mmol/L (ref 134–144)

## 2016-09-01 ENCOUNTER — Encounter: Payer: Self-pay | Admitting: Physician Assistant

## 2016-09-01 ENCOUNTER — Ambulatory Visit (INDEPENDENT_AMBULATORY_CARE_PROVIDER_SITE_OTHER): Payer: BLUE CROSS/BLUE SHIELD | Admitting: Physician Assistant

## 2016-09-01 VITALS — BP 117/76 | HR 73 | Temp 98.0°F | Resp 18 | Ht 66.14 in | Wt 136.0 lb

## 2016-09-01 DIAGNOSIS — R109 Unspecified abdominal pain: Secondary | ICD-10-CM

## 2016-09-01 DIAGNOSIS — R197 Diarrhea, unspecified: Secondary | ICD-10-CM

## 2016-09-01 LAB — POCT CBC
Granulocyte percent: 54.9 %G (ref 37–80)
HCT, POC: 42.5 % (ref 37.7–47.9)
HEMOGLOBIN: 14.2 g/dL (ref 12.2–16.2)
LYMPH, POC: 1.6 (ref 0.6–3.4)
MCH: 29.2 pg (ref 27–31.2)
MCHC: 33.5 g/dL (ref 31.8–35.4)
MCV: 87.2 fL (ref 80–97)
MID (cbc): 0.5 (ref 0–0.9)
MPV: 7.8 fL (ref 0–99.8)
POC Granulocyte: 2.6 (ref 2–6.9)
POC LYMPH PERCENT: 34.8 %L (ref 10–50)
POC MID %: 10.3 % (ref 0–12)
Platelet Count, POC: 201 10*3/uL (ref 142–424)
RBC: 4.88 M/uL (ref 4.04–5.48)
RDW, POC: 12.8 %
WBC: 4.7 10*3/uL (ref 4.6–10.2)

## 2016-09-01 LAB — POCT URINALYSIS DIP (MANUAL ENTRY)
BILIRUBIN UA: NEGATIVE mg/dL
GLUCOSE UA: NEGATIVE mg/dL
Leukocytes, UA: NEGATIVE
Nitrite, UA: NEGATIVE
Protein Ur, POC: 30 mg/dL — AB
RBC UA: NEGATIVE
Urobilinogen, UA: 0.2 E.U./dL
pH, UA: 5.5 (ref 5.0–8.0)

## 2016-09-01 MED ORDER — HYOSCYAMINE SULFATE 0.125 MG SL SUBL: 0.1250 mg | SUBLINGUAL_TABLET | Freq: Four times a day (QID) | SUBLINGUAL | 0 refills | Status: DC | PRN

## 2016-09-01 MED ORDER — ONDANSETRON 8 MG PO TBDP: 8.0000 mg | ORAL_TABLET | Freq: Three times a day (TID) | ORAL | 0 refills | Status: DC | PRN

## 2016-09-01 NOTE — Patient Instructions (Addendum)
Please return if your symptoms do not improve in the next 3 days. Your labs look fine.  We can recheck you if this is not improving.   These can generally last for 7 days.  If not improved, we will then run a stool culture.  Food Choices to Help Relieve Diarrhea, Adult When you have diarrhea, the foods you eat and your eating habits are very important. Choosing the right foods and drinks can help:  Relieve diarrhea.  Replace lost fluids and nutrients.  Prevent dehydration.  What general guidelines should I follow? Relieving diarrhea  Choose foods with less than 2 g or .07 oz. of fiber per serving.  Limit fats to less than 8 tsp (38 g or 1.34 oz.) a day.  Avoid the following: ? Foods and beverages sweetened with high-fructose corn syrup, honey, or sugar alcohols such as xylitol, sorbitol, and mannitol. ? Foods that contain a lot of fat or sugar. ? Fried, greasy, or spicy foods. ? High-fiber grains, breads, and cereals. ? Raw fruits and vegetables.  Eat foods that are rich in probiotics. These foods include dairy products such as yogurt and fermented milk products. They help increase healthy bacteria in the stomach and intestines (gastrointestinal tract, or GI tract).  If you have lactose intolerance, avoid dairy products. These may make your diarrhea worse.  Take medicine to help stop diarrhea (antidiarrheal medicine) only as told by your health care provider. Replacing nutrients  Eat small meals or snacks every 3-4 hours.  Eat bland foods, such as white rice, toast, or baked potato, until your diarrhea starts to get better. Gradually reintroduce nutrient-rich foods as tolerated or as told by your health care provider. This includes: ? Well-cooked protein foods. ? Peeled, seeded, and soft-cooked fruits and vegetables. ? Low-fat dairy products.  Take vitamin and mineral supplements as told by your health care provider. Preventing dehydration   Start by sipping water or a  special solution to prevent dehydration (oral rehydration solution, ORS). Urine that is clear or pale yellow means that you are getting enough fluid.  Try to drink at least 8-10 cups of fluid each day to help replace lost fluids.  You may add other liquids in addition to water, such as clear juice or decaffeinated sports drinks, as tolerated or as told by your health care provider.  Avoid drinks with caffeine, such as coffee, tea, or soft drinks.  Avoid alcohol. What foods are recommended? The items listed may not be a complete list. Talk with your health care provider about what dietary choices are best for you. Grains White rice. White, [place], or pita breads (fresh or toasted), including plain rolls, buns, or bagels. White pasta. Saltine, soda, or graham crackers. Pretzels. Low-fiber cereal. Cooked cereals made with water (such as cornmeal, farina, or cream cereals). Plain muffins. Matzo. Melba toast. Zwieback. Vegetables Potatoes (without the skin). Most well-cooked and canned vegetables without skins or seeds. Tender lettuce. Fruits Apple sauce. Fruits canned in juice. Cooked apricots, cherries, grapefruit, peaches, pears, or plums. Fresh bananas and cantaloupe. Meats and other protein foods Baked or boiled chicken. Eggs. Tofu. Fish. Seafood. Smooth nut butters. Ground or well-cooked tender beef, ham, veal, lamb, pork, or poultry. Dairy Plain yogurt, kefir, and unsweetened liquid yogurt. Lactose-free milk, buttermilk, skim milk, or soy milk. Low-fat or nonfat hard cheese. Beverages Water. Low-calorie sports drinks. Fruit juices without pulp. Strained tomato and vegetable juices. Decaffeinated teas. Sugar-free beverages not sweetened with sugar alcohols. Oral rehydration solutions, if approved by your  health care provider. Seasoning and other foods Bouillon, broth, or soups made from recommended foods. What foods are not recommended? The items listed may not be a complete list. Talk  with your health care provider about what dietary choices are best for you. Grains Whole grain, whole wheat, bran, or rye breads, rolls, pastas, and crackers. Wild or brown rice. Whole grain or bran cereals. Barley. Oats and oatmeal. Corn tortillas or taco shells. Granola. Popcorn. Vegetables Raw vegetables. Fried vegetables. Cabbage, broccoli, Brussels sprouts, artichokes, baked beans, beet greens, corn, kale, legumes, peas, sweet potatoes, and yams. Potato skins. Cooked spinach and cabbage. Fruits Dried fruit, including raisins and dates. Raw fruits. Stewed or dried prunes. Canned fruits with syrup. Meat and other protein foods Fried or fatty meats. Deli meats. Chunky nut butters. Nuts and seeds. Beans and lentils. Tomasa BlaseBacon. Hot dogs. Sausage. Dairy High-fat cheeses. Whole milk, chocolate milk, and beverages made with milk, such as milk shakes. Half-and-half. Cream. sour cream. Ice cream. Beverages Caffeinated beverages (such as coffee, tea, soda, or energy drinks). Alcoholic beverages. Fruit juices with pulp. Prune juice. Soft drinks sweetened with high-fructose corn syrup or sugar alcohols. High-calorie sports drinks. Fats and oils Butter. Cream sauces. Margarine. Salad oils. Plain salad dressings. Olives. Avocados. Mayonnaise. Sweets and desserts Sweet rolls, doughnuts, and sweet breads. Sugar-free desserts sweetened with sugar alcohols such as xylitol and sorbitol. Seasoning and other foods Honey. Hot sauce. Chili powder. Gravy. Cream-based or milk-based soups. Pancakes and waffles. Summary  When you have diarrhea, the foods you eat and your eating habits are very important.  Make sure you get at least 8-10 cups of fluid each day, or enough to keep your urine clear or pale yellow.  Eat bland foods and gradually reintroduce healthy, nutrient-rich foods as tolerated, or as told by your health care provider.  Avoid high-fiber, fried, greasy, or spicy foods. This information is not  intended to replace advice given to you by your health care provider. Make sure you discuss any questions you have with your health care provider. Document Released: 05/09/2003 Document Revised: 02/14/2016 Document Reviewed: 02/14/2016 Elsevier Interactive Patient Education  2017 [employer]    IF you received an x-ray today, you will receive an invoice from [facility]. Please contact [facility] at [phone] with questions or concerns regarding your invoice.   IF you received labwork today, you will receive an invoice from [facility]. Please contact [facility] at [phone] with questions or concerns regarding your invoice.   Our billing staff will not be able to assist you with questions regarding bills from these companies.  You will be contacted with the lab results as soon as they are available. The fastest way to get your results is to activate your My Chart account. Instructions are located on the last page of this paperwork. If you have not heard from us regarding the results in 2 weeks, please contact this office.

## 2016-09-01 NOTE — Progress Notes (Signed)
PRIMARY CARE AT [facility] [address] [place] [address] [phone]  Date:  09/01/2016   Name:  Lori Rice   DOB:  1985-12-29   MRN:  [id number]  PCP:  Patient, No Pcp Per    History of Present Illness:  Lori Rice is a 31 y.o. female patient who presents to PCP with  Chief Complaint  Patient presents with  . Diarrhea    x 4 days with some vommiting      Watery stool for the last 4 days.  Emesis the first day.  Had gatorade and half bagel afterward.  She has some abdominal pain.  She is having diarrhea 4 episodes.  She had one day of emesis.  She is eating.  No nausea at this time.   She is not hydrating well.  No dizziness.  She feels weak and lethargic.  She had fever the first day.   She is not travelling.  She is babysitting.  Fiance had diarrhea for 1 day.  She is not swimming lately.   No frequency, hematuria, or dysuria.  No ab normal vaginal discharge.   Patient Active Problem List   Diagnosis Date Noted  . Situational anxiety 11/08/2015  . Palpitations 06/19/2013  . Dyspnea 06/19/2013  . Retrocecal appendicitis 07/21/2012    Past Medical History:  Diagnosis Date  . Anxiety   . Bradycardia   . Depression   . Migraines   . Mononucleosis   . Palpitations     Past Surgical History:  Procedure Laterality Date  . LAPAROSCOPIC APPENDECTOMY N/A 07/21/2012   Procedure: APPENDECTOMY LAPAROSCOPIC;  Surgeon: Currie Paris, MD;  Location: [facility];  Service: General;  Laterality: N/A;  . TONSILLECTOMY    . TONSILLECTOMY      Social History  Substance Use Topics  . Smoking status: Never Smoker  . Smokeless tobacco: Never Used  . Alcohol use 1.2 - 1.8 oz/week    2 - 3 Standard drinks or equivalent per week    Family History  Problem Relation Age of Onset  . Cancer Mother        breast  . Lung cancer Paternal Grandfather   . Heart disease Paternal Grandfather   . Hypertension Maternal Grandmother   . Bladder Cancer Maternal Grandfather   .  Hypertension Maternal Grandfather   . Stroke Maternal Grandfather   . Heart disease Maternal Grandfather   . Heart failure Maternal Grandfather   . Breast cancer Paternal Grandmother     No Known Allergies  Medication list has been reviewed and updated.  Current Outpatient Prescriptions on File Prior to Visit  Medication Sig Dispense Refill  . ALPRAZolam (XANAX) 0.25 MG tablet Take 1 tablet (0.25 mg total) by mouth 3 (three) times daily as needed for anxiety. 10 tablet 0  . topiramate (TOPAMAX) 25 MG tablet Take 1 tablet (25 mg total) by mouth daily. Can increase to twice per day after 1 week. 30 tablet 1   No current facility-administered medications on file prior to visit.     ROS ROS otherwise unremarkable unless listed above.  Physical Examination: BP 117/76   Pulse 73   Temp 98 F (36.7 C) (Oral)   Resp 18   Ht 5' 6.14" (1.68 m)   Wt 136 lb (61.7 kg)   LMP 08/17/2016 (Approximate)   SpO2 98%   BMI 21.86 kg/m  Ideal Body Weight: Weight in (lb) to have BMI = 25: 155.2  Physical Exam  Results for orders placed  or performed in visit on 09/01/16  POCT urinalysis dipstick  Result Value Ref Range   Color, UA yellow yellow   Clarity, UA cloudy (A) clear   Glucose, UA negative negative mg/dL   Bilirubin, UA small (A) negative   Ketones, POC UA negative negative mg/dL   Spec Grav, UA >=1.610>=1.030 (A) 1.010 - 1.025   Blood, UA negative negative   pH, UA 5.5 5.0 - 8.0   Protein Ur, POC =30 (A) negative mg/dL   Urobilinogen, UA 0.2 0.2 or 1.0 E.U./dL   Nitrite, UA Negative Negative   Leukocytes, UA Negative Negative  POCT CBC  Result Value Ref Range   WBC 4.7 4.6 - 10.2 K/uL   Lymph, poc 1.6 0.6 - 3.4   POC LYMPH PERCENT 34.8 10 - 50 %L   MID (cbc) 0.5 0 - 0.9   POC MID % 10.3 0 - 12 %M   POC Granulocyte 2.6 2 - 6.9   Granulocyte percent 54.9 37 - 80 %G   RBC 4.88 4.04 - 5.48 M/uL   Hemoglobin 14.2 12.2 - 16.2 g/dL   HCT, POC 96.042.5 45.437.7 - 47.9 %   MCV 87.2 80 - 97  fL   MCH, POC 29.2 27 - 31.2 pg   MCHC 33.5 31.8 - 35.4 g/dL   RDW, POC 09.812.8 %   Platelet Count, POC 201 142 - 424 K/uL   MPV 7.8 0 - 99.8 fL     Assessment and Plan: Lori Rice is a 31 y.o. female who is here today for cc of diarrhea. Advised anti-diarrhea diet.  Given cramping medication as well as for nausea.   Diarrhea, unspecified type - Plan: POCT urinalysis dipstick, POCT CBC  Abdominal cramping - Plan: POCT urinalysis dipstick, POCT CBC  Trena PlattStephanie English, PA-C [facility] 7/6/20189:05 AM

## 2016-11-13 ENCOUNTER — Emergency Department (HOSPITAL_BASED_OUTPATIENT_CLINIC_OR_DEPARTMENT_OTHER)
Admission: EM | Admit: 2016-11-13 | Discharge: 2016-11-13 | Disposition: A | Discharge status: Home / Self Care | Payer: BLUE CROSS/BLUE SHIELD | Attending: Emergency Medicine | Admitting: Emergency Medicine

## 2016-11-13 ENCOUNTER — Encounter (HOSPITAL_BASED_OUTPATIENT_CLINIC_OR_DEPARTMENT_OTHER): Payer: Self-pay | Admitting: Emergency Medicine

## 2016-11-13 DIAGNOSIS — R519 Headache, unspecified: Secondary | ICD-10-CM

## 2016-11-13 DIAGNOSIS — R51 Headache: Secondary | ICD-10-CM | POA: Insufficient documentation

## 2016-11-13 DIAGNOSIS — Z79899 Other long term (current) drug therapy: Secondary | ICD-10-CM | POA: Insufficient documentation

## 2016-11-13 MED ORDER — SODIUM CHLORIDE 0.9 % IV BOLUS (SEPSIS)
1000.0000 mL | Freq: Once | INTRAVENOUS | Status: AC
  Administered 2016-11-13: 1000 mL via INTRAVENOUS

## 2016-11-13 MED ORDER — DEXAMETHASONE SODIUM PHOSPHATE 10 MG/ML IJ SOLN
10.0000 mg | Freq: Once | INTRAMUSCULAR | Status: AC
  Administered 2016-11-13: 10 mg via INTRAVENOUS
  Filled 2016-11-13: qty 1

## 2016-11-13 MED ORDER — KETOROLAC TROMETHAMINE 30 MG/ML IJ SOLN
30.0000 mg | Freq: Once | INTRAMUSCULAR | Status: AC
  Administered 2016-11-13: 30 mg via INTRAVENOUS
  Filled 2016-11-13: qty 1

## 2016-11-13 MED ORDER — DIPHENHYDRAMINE HCL 50 MG/ML IJ SOLN
25.0000 mg | Freq: Once | INTRAMUSCULAR | Status: AC
  Administered 2016-11-13: 25 mg via INTRAVENOUS
  Filled 2016-11-13: qty 1

## 2016-11-13 MED ORDER — METOCLOPRAMIDE HCL 5 MG/ML IJ SOLN
10.0000 mg | Freq: Once | INTRAMUSCULAR | Status: AC
  Administered 2016-11-13: 10 mg via INTRAVENOUS
  Filled 2016-11-13: qty 2

## 2016-11-13 NOTE — ED Triage Notes (Signed)
Patient reports migraine x2 days.  Reports history of the same.  Taking medications without relief.

## 2016-11-13 NOTE — ED Provider Notes (Signed)
[facility] Provider Note   CSN: [id number] Arrival date & time: 11/13/16  1333     History   Chief Complaint Chief Complaint  Patient presents with  . Migraine    HPI Lori Rice is a 31 y.o. female.  HPI   31 year old female with history of migraine presenting for evaluation of headache. Patient reports gradual onset of persistent headache ongoing for the past 3-4 days. She describes headache as a throbbing sensation throughout her head worse when she leans forward with head movement. She endorse some light and sound sensitivity, feeling nauseous, and did had several bouts of diarrhea previously. Headache feels similar to her usual migraine headache except more intense and last much longer. Patient has tried her usual headache medication including Topamax, and nortriptyline without relief. She denies associated fever, URI symptoms, chest pain, short of breath, abdominal pain, focal numbness or weakness, or rash. She recently came home from her honeymoon from [place]. She did report a couple mosquito bites to her right shoulder but denies any significant itchiness from there. No report of myalgias, neck stiffness, or fatigue  Past Medical History:  Diagnosis Date  . Anxiety   . Bradycardia   . Depression   . Migraines   . Mononucleosis   . Palpitations     Patient Active Problem List   Diagnosis Date Noted  . Situational anxiety 11/08/2015  . Palpitations 06/19/2013  . Dyspnea 06/19/2013  . Retrocecal appendicitis 07/21/2012    Past Surgical History:  Procedure Laterality Date  . LAPAROSCOPIC APPENDECTOMY N/A 07/21/2012   Procedure: APPENDECTOMY LAPAROSCOPIC;  Surgeon: Currie Paris, MD;  Location: [facility];  Service: General;  Laterality: N/A;  . TONSILLECTOMY    . TONSILLECTOMY      OB History    Gravida Para Term Preterm AB Living   0 0 0 0 0 0   SAB TAB Ectopic Multiple Live Births   0 0 0 0 0       Home Medications    Prior to  Admission medications   Medication Sig Start Date End Date Taking? Authorizing Provider  naratriptan (AMERGE) 2.5 MG tablet Take 2.5 mg by mouth as needed for migraine. Take one (1) tablet at onset of headache; if returns or does not resolve, may repeat after 4 hours; do not exceed five (5) mg in 24 hours.   Yes [provider]  ALPRAZolam (XANAX) 0.25 MG tablet Take 1 tablet (0.25 mg total) by mouth 3 (three) times daily as needed for anxiety. 11/08/15   Shade Flood, MD  hyoscyamine (LEVSIN SL) 0.125 MG SL tablet Place 1 tablet (0.125 mg total) under the tongue 4 (four) times daily as needed. 09/01/16   Trena Platt D, PA  ondansetron (ZOFRAN-ODT) 8 MG disintegrating tablet Take 1 tablet (8 mg total) by mouth every 8 (eight) hours as needed for nausea. 09/01/16   Trena Platt D, PA  topiramate (TOPAMAX) 25 MG tablet Take 1 tablet (25 mg total) by mouth daily. Can increase to twice per day after 1 week. 03/23/16   Shade Flood, MD    Family History Family History  Problem Relation Age of Onset  . Cancer Mother        breast  . Lung cancer Paternal Grandfather   . Heart disease Paternal Grandfather   . Hypertension Maternal Grandmother   . Bladder Cancer Maternal Grandfather   . Hypertension Maternal Grandfather   . Stroke Maternal Grandfather   . Heart disease Maternal  Grandfather   . Heart failure Maternal Grandfather   . Breast cancer Paternal Grandmother     Social History Social History  Substance Use Topics  . Smoking status: Never Smoker  . Smokeless tobacco: Never Used  . Alcohol use 1.2 - 1.8 oz/week    2 - 3 Standard drinks or equivalent per week     Allergies   Patient has no known allergies.   Review of Systems Review of Systems  All other systems reviewed and are negative.    Physical Exam Updated Vital Signs BP 117/73 (BP Location: Right Arm)   Pulse 70   Temp 98.1 F (36.7 C) (Oral)   Resp 18   Ht  (1.676 m)   Wt 63.5 kg  (140 lb)   SpO2 100%   BMI 22.60 kg/m   Physical Exam  Constitutional: She is oriented to person, place, and time. She appears well-developed and well-nourished. No distress.  HENT:  Head: Atraumatic.  Mouth/Throat: Oropharynx is clear and moist.  Eyes: Pupils are equal, round, and reactive to light. Conjunctivae and EOM are normal.  Neck: Neck supple.  No nuchal rigidity  Cardiovascular: Normal rate and regular rhythm.   Pulmonary/Chest: Effort normal and breath sounds normal.  Neurological: She is alert and oriented to person, place, and time.  Neurologic exam:  Speech clear, pupils equal round reactive to light, extraocular movements intact  Normal peripheral visual fields Cranial nerves III through XII normal including no facial droop Follows commands, moves all extremities x4, normal strength to bilateral upper and lower extremities at all major muscle groups including grip Sensation normal to light touch  Coordination intact, no limb ataxia, finger-nose-finger normal Rapid alternating movements normal No pronator drift Gait normal   Skin: No rash noted.  Psychiatric: She has a normal mood and affect.  Nursing note and vitals reviewed.    ED Treatments / Results  Labs (all labs ordered are listed, but only abnormal results are displayed) Labs Reviewed - No data to display  EKG  EKG Interpretation None       Radiology No results found.  Procedures Procedures (including critical care time)  Medications Ordered in ED Medications  sodium chloride 0.9 % bolus 1,000 mL (1,000 mLs Intravenous New Bag/Given 11/13/16 1438)  ketorolac (TORADOL) 30 MG/ML injection 30 mg (30 mg Intravenous Given 11/13/16 1439)  diphenhydrAMINE (BENADRYL) injection 25 mg (25 mg Intravenous Given 11/13/16 1441)  metoCLOPramide (REGLAN) injection 10 mg (10 mg Intravenous Given 11/13/16 1439)  dexamethasone (DECADRON) injection 10 mg (10 mg Intravenous Given 11/13/16 1442)     Initial  Impression / Assessment and Plan / ED Course  I have reviewed the triage vital signs and the nursing notes.  Pertinent labs & imaging results that were available during my care of the patient were reviewed by me and considered in my medical decision making (see chart for details).     BP 117/73 (BP Location: Right Arm)   Pulse 70   Temp 98.1 F (36.7 C) (Oral)   Resp 18   Ht  (1.676 m)   Wt 63.5 kg (140 lb)   SpO2 100%   BMI 22.60 kg/m    Final Clinical Impressions(s) / ED Diagnoses   Final diagnoses:  Bad headache    New Prescriptions New Prescriptions   No medications on file   Headache similar to previous, no fever, neck stiffness, neuro findings or new symptoms to suggest more serious etiology.  I don't think SAH, ICH,  meningitis, encephalitis, mass at this time.  No recent trauma.  I don't feel imaging necessary at this time.  Plan to control symptoms.  3:45 PM Pt report her headache is mostly resolved.  She feels comfortable going home and will f/u with her headache specialist for further care.  Return precaution given.      Fayrene Helper, PA-C 11/13/16 1546    Cathren Laine, MD 11/13/16 [phone]

## 2016-11-24 ENCOUNTER — Ambulatory Visit (INDEPENDENT_AMBULATORY_CARE_PROVIDER_SITE_OTHER): Payer: BLUE CROSS/BLUE SHIELD | Admitting: Certified Nurse Midwife

## 2016-11-24 ENCOUNTER — Encounter: Payer: Self-pay | Admitting: Certified Nurse Midwife

## 2016-11-24 VITALS — BP 90/60 | HR 68 | Resp 16 | Ht 65.75 in | Wt 142.0 lb

## 2016-11-24 DIAGNOSIS — Z01419 Encounter for gynecological examination (general) (routine) without abnormal findings: Secondary | ICD-10-CM

## 2016-11-24 DIAGNOSIS — Z8669 Personal history of other diseases of the nervous system and sense organs: Secondary | ICD-10-CM | POA: Diagnosis not present

## 2016-11-24 NOTE — Progress Notes (Signed)
31 y.o. G0P0000 married  Caucasian Fe here for annual exam. Periods normal, no issues. Condoms working well for contraception. Just got back from [place] from her honeymoon. Seeing Headache and wellness for migraine headache management. Had the worst migraine ever coming back from honeymoon and was seen ER for management.  Patient has since been seen again by Dr. Neale Burly and has rescue medication if needed for Severe migraine. Sees Urgent care if needed, no visits in last year. No health issues today. Happy to be finally married!  Patient's last menstrual period was 11/10/2016.          Sexually active: Yes.    The current method of family planning is condoms all the time.    Exercising: Yes.    yoga & running Smoker:  no  Health Maintenance: Pap:  2013 neg, 11-19-15 neg History of Abnormal Pap: no MMG:  none Self Breast exams: occ Colonoscopy:  none BMD:   none TDaP:  2017 Shingles: no Pneumonia: no Hep C and HIV: not done Labs: if needed   reports that she has never smoked. She has never used smokeless tobacco. She reports that she drinks about 1.2 oz of alcohol per week . She reports that she does not use drugs.  Past Medical History:  Diagnosis Date  . Anxiety   . Bradycardia   . Depression   . Migraines   . Mononucleosis   . Palpitations     Past Surgical History:  Procedure Laterality Date  . LAPAROSCOPIC APPENDECTOMY N/A 07/21/2012   Procedure: APPENDECTOMY LAPAROSCOPIC;  Surgeon: Currie Paris, MD;  Location: [facility];  Service: General;  Laterality: N/A;  . TONSILLECTOMY    . TONSILLECTOMY      Current Outpatient Prescriptions  Medication Sig Dispense Refill  . ALPRAZolam (XANAX) 0.25 MG tablet Take 1 tablet (0.25 mg total) by mouth 3 (three) times daily as needed for anxiety. 10 tablet 0  . naratriptan (AMERGE) 2.5 MG tablet Take 2.5 mg by mouth as needed for migraine. Take one (1) tablet at onset of headache; if returns or does not resolve, may repeat after 4  hours; do not exceed five (5) mg in 24 hours.    [name] perphenazine (TRILAFON) 4 MG tablet TAKE 1 TO 2 TABLETS BY MOUTH AS NEEDED EVERY 2-3 HOURS FOR HEADAHCE RESCUE. *MAX 4-6 TABS/24HOURS.  0  . topiramate (TOPAMAX) 25 MG tablet Take 1 tablet (25 mg total) by mouth daily. Can increase to twice per day after 1 week. 30 tablet 1   No current facility-administered medications for this visit.     Family History  Problem Relation Age of Onset  . Cancer Mother        breast  . Lung cancer Paternal Grandfather   . Heart disease Paternal Grandfather   . Hypertension Maternal Grandmother   . Bladder Cancer Maternal Grandfather   . Hypertension Maternal Grandfather   . Stroke Maternal Grandfather   . Heart disease Maternal Grandfather   . Heart failure Maternal Grandfather   . Breast cancer Paternal Grandmother     ROS:  Pertinent items are noted in HPI.  Otherwise, a comprehensive ROS was negative.  Exam:   BP 90/60   Pulse 68   Resp 16   Ht 5' 5.75" (1.67 m)   Wt 142 lb (64.4 kg)   LMP 11/10/2016   BMI 23.09 kg/m  Height: 5' 5.75" (167 cm) Ht Readings from Last 3 Encounters:  11/24/16 5' 5.75" (1.67 m)  11/13/16   (1.676 m)  09/01/16 5' 6.14" (1.68 m)    General appearance: alert, cooperative and appears stated age Head: Normocephalic, without obvious abnormality, atraumatic Neck: no adenopathy, supple, symmetrical, trachea midline and thyroid normal to inspection and palpation Lungs: clear to auscultation bilaterally Breasts: normal appearance, no masses or tenderness, No nipple retraction or dimpling, No nipple discharge or bleeding, No axillary or supraclavicular adenopathy Heart: regular rate and rhythm Abdomen: soft, non-tender; no masses,  no organomegaly Extremities: extremities normal, atraumatic, no cyanosis or edema Skin: Skin color, texture, turgor normal. No rashes or lesions Lymph nodes: Cervical, supraclavicular, and axillary nodes normal. No abnormal inguinal  nodes palpated Neurologic: Grossly normal   Pelvic: External genitalia:  no lesions              Urethra:  normal appearing urethra with no masses, tenderness or lesions              Bartholin's and Skene's: normal                 Vagina: normal appearing vagina with normal color and discharge, no lesions              Cervix: no cervical motion tenderness, no lesions and nulliparous appearance              Pap taken: No. Bimanual Exam:  Uterus:  normal size, contour, position, consistency, mobility, non-tender              Adnexa: normal adnexa and no mass, fullness, tenderness               Rectovaginal: Confirms               Anus:  normal   Chaperone present: yes  A:  Well Woman with normal exam  Contraception condoms consistent  Migraine headaches with Neurology management  Family history of breast cancer mother( alive)  P:   Reviewed health and wellness pertinent to exam  Aware of warning signs with migraine headache and need to seek ER as before if occurs.  Aware of need to have mammograms beginning at age 5 and she is aware of genetic screening and declines further information.  Pap smear: no   counseled on breast self exam, adequate intake of calcium and vitamin D, diet and exercise  return annually or prn  An After Visit Summary was printed and given to the patient.

## 2016-11-24 NOTE — Patient Instructions (Signed)

## 2017-06-09 ENCOUNTER — Encounter: Payer: Self-pay | Admitting: Physician Assistant

## 2017-12-01 ENCOUNTER — Other Ambulatory Visit: Payer: Self-pay

## 2017-12-01 ENCOUNTER — Ambulatory Visit (INDEPENDENT_AMBULATORY_CARE_PROVIDER_SITE_OTHER): Payer: No Typology Code available for payment source | Admitting: Certified Nurse Midwife

## 2017-12-01 ENCOUNTER — Encounter: Payer: Self-pay | Admitting: Certified Nurse Midwife

## 2017-12-01 VITALS — BP 100/64 | HR 68 | Resp 16 | Ht 66.25 in | Wt 140.0 lb

## 2017-12-01 DIAGNOSIS — Z01419 Encounter for gynecological examination (general) (routine) without abnormal findings: Secondary | ICD-10-CM

## 2017-12-01 DIAGNOSIS — Z8669 Personal history of other diseases of the nervous system and sense organs: Secondary | ICD-10-CM

## 2017-12-01 NOTE — Progress Notes (Signed)
32 y.o. G0P0000 Married  Caucasian Fe here for annual exam. Periods now 45 day cycle with 5 day duration. Patient is not concerned about this at this point until ready to try for pregnancy. Regular normal flow, no issues. Continues with Topamax for migraine control.  No other health issues today.  Patient's last menstrual period was 11/21/2017 (exact date).          Sexually active: Yes.    The current method of family planning is condoms all the time.    Exercising: Yes.    walking, lifting, yoga Smoker:  no  Review of Systems  Constitutional: Negative.   HENT: Negative.   Eyes: Negative.   Respiratory: Negative.   Cardiovascular: Negative.   Gastrointestinal: Negative.   Genitourinary:       Length of menstrual cycle  Musculoskeletal: Negative.   Skin: Negative.   Neurological: Negative.   Endo/Heme/Allergies: Negative.   Psychiatric/Behavioral: Negative.     Health Maintenance: Pap:  11-19-15 neg History of Abnormal Pap: no MMG:  none Self Breast exams: yes Colonoscopy:  none BMD:   none TDaP:  2017 Shingles: no Pneumonia: no Hep C and HIV: not done Labs: yes   reports that she has never smoked. She has never used smokeless tobacco. She reports that she drinks about 2.0 - 3.0 standard drinks of alcohol per week. She reports that she does not use drugs.  Past Medical History:  Diagnosis Date  . Anxiety   . Bradycardia   . Depression   . Migraines   . Mononucleosis   . Palpitations     Past Surgical History:  Procedure Laterality Date  . LAPAROSCOPIC APPENDECTOMY N/A 07/21/2012   Procedure: APPENDECTOMY LAPAROSCOPIC;  Surgeon: Currie Paris, MD;  Location: [facility];  Service: General;  Laterality: N/A;  . TONSILLECTOMY    . TONSILLECTOMY      Current Outpatient Medications  Medication Sig Dispense Refill  . naratriptan (AMERGE) 2.5 MG tablet Take 2.5 mg by mouth as needed for migraine. Take one (1) tablet at onset of headache; if returns or does not  resolve, may repeat after 4 hours; do not exceed five (5) mg in 24 hours.    [name] perphenazine (TRILAFON) 4 MG tablet TAKE 1 TO 2 TABLETS BY MOUTH AS NEEDED EVERY 2-3 HOURS FOR HEADAHCE RESCUE. *MAX 4-6 TABS/24HOURS.  0  . topiramate (TOPAMAX) 200 MG tablet Take 200 mg by mouth 2 (two) times daily.     No current facility-administered medications for this visit.     Family History  Problem Relation Age of Onset  . Cancer Mother        breast  . Lung cancer Paternal Grandfather   . Heart disease Paternal Grandfather   . Hypertension Maternal Grandmother   . Bladder Cancer Maternal Grandfather   . Hypertension Maternal Grandfather   . Stroke Maternal Grandfather   . Heart disease Maternal Grandfather   . Heart failure Maternal Grandfather   . Breast cancer Paternal Grandmother     ROS:  Pertinent items are noted in HPI.  Otherwise, a comprehensive ROS was negative.  Exam:   BP 100/64   Pulse 68   Resp 16   Ht 5' 6.25" (1.683 m)   Wt 140 lb (63.5 kg)   LMP 11/21/2017 (Exact Date)   BMI 22.43 kg/m  Height: 5' 6.25" (168.3 cm) Ht Readings from Last 3 Encounters:  12/01/17 5' 6.25" (1.683 m)  11/24/16 5' 5.75" (1.67 m)  11/13/16 5\' 6"  (  1.676 m)    General appearance: alert, cooperative and appears stated age Head: Normocephalic, without obvious abnormality, atraumatic Neck: no adenopathy, supple, symmetrical, trachea midline and thyroid normal to inspection and palpation Lungs: clear to auscultation bilaterally Breasts: normal appearance, no masses or tenderness, No nipple retraction or dimpling, No nipple discharge or bleeding, No axillary or supraclavicular adenopathy Heart: regular rate and rhythm Abdomen: soft, non-tender; no masses,  no organomegaly Extremities: extremities normal, atraumatic, no cyanosis or edema Skin: Skin color, texture, turgor normal. No rashes or lesions Lymph nodes: Cervical, supraclavicular, and axillary nodes normal. No abnormal inguinal nodes  palpated Neurologic: Grossly normal   Pelvic: External genitalia:  no lesions              Urethra:  normal appearing urethra with no masses, tenderness or lesions              Bartholin's and Skene's: normal                 Vagina: normal appearing vagina with normal color and discharge, no lesions              Cervix: no cervical motion tenderness, no lesions and nulliparous appearance              Pap taken: No. Bimanual Exam:  Uterus:  normal size, contour, position, consistency, mobility, non-tender and anteverted              Adnexa: normal adnexa and no mass, fullness, tenderness               Rectovaginal: Confirms               Anus:  Normal appearance, no lesions  Chaperone present: yes  A:  Well Woman with normal exam  Contraception condoms  History of chronic migraine headaches with Neurology management  P:   Reviewed health and wellness pertinent to exam  Continue follow up as indicated with MD.  Pap smear: no   counseled on breast self exam, adequate intake of calcium and vitamin D, diet and exercise  return annually or prn  An After Visit Summary was printed and given to the patient.

## 2017-12-01 NOTE — Patient Instructions (Signed)

## 2018-05-16 ENCOUNTER — Other Ambulatory Visit: Payer: Self-pay

## 2018-05-16 ENCOUNTER — Ambulatory Visit (INDEPENDENT_AMBULATORY_CARE_PROVIDER_SITE_OTHER): Payer: No Typology Code available for payment source | Admitting: Family Medicine

## 2018-05-16 ENCOUNTER — Encounter: Payer: Self-pay | Admitting: Family Medicine

## 2018-05-16 ENCOUNTER — Other Ambulatory Visit: Payer: Self-pay | Admitting: Family Medicine

## 2018-05-16 VITALS — BP 102/68 | HR 92 | Temp 98.4°F | Resp 12 | Ht 66.5 in | Wt 136.0 lb

## 2018-05-16 DIAGNOSIS — R509 Fever, unspecified: Secondary | ICD-10-CM

## 2018-05-16 DIAGNOSIS — R6889 Other general symptoms and signs: Secondary | ICD-10-CM | POA: Diagnosis not present

## 2018-05-16 DIAGNOSIS — R5383 Other fatigue: Secondary | ICD-10-CM | POA: Diagnosis not present

## 2018-05-16 DIAGNOSIS — R059 Cough, unspecified: Secondary | ICD-10-CM

## 2018-05-16 DIAGNOSIS — R05 Cough: Secondary | ICD-10-CM

## 2018-05-16 LAB — POCT INFLUENZA A/B
Influenza A, POC: NEGATIVE
Influenza B, POC: NEGATIVE

## 2018-05-16 NOTE — Progress Notes (Signed)
Subjective:    Patient ID: Lori Rice, female    DOB: 24-Nov-1985, 33 y.o.   MRN: [id number]  HPI Lori Rice is a 33 y.o. female Presents today for: Chief Complaint  Patient presents with  . flu like sympthoms    low fever, bodyaches, chills, eyes hurtsand cough. Been sick 2 days now. Was in [place] from 05/07/18-05/12/18 went to a book signing. The author at the book signing posted on facebook stated someone was tested pos for corona virus that was at the book signing. My sister whom was with me is also sick and being tested. no-sorethroat, yes-little sob, yes-cough, yes-headache, no abd pain no diarrhea. no lung disease, no cardiac problems, no chronic diseases not a smoker and not pregnant at this time   Presents with symptoms above.   Travel to [facility] 3/7-3/12. Possible contact with coronavirus with individual who was at book signing that she was also present at, that subsequently was noted to have positive coronavirus testing.  Was in the [place] area in [facility] [place].  No symptoms when she was in [place] or when traveling.  Returned March 12.  Did some work in the yard on March 13.  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  Was feeling fine at that time.  Went to dinner that night with her parents.  [place] in [place].  Later that night she experienced some increased fatigue, thought it was due to yard work the day prior.  Subsequently developed some chills, then by the next morning (yesterday) she was having body aches, increased fatigue, mild cough and headache described as a dull headache behind her eyes.    Was also experiencing subjective fever, and when she took her temp yesterday measured 100 point something.  Unsure of the actual temp at that time. Remained at home yesterday.  Denies true shortness of breath or dyspnea on exertion.  Initially reported possible dyspnea only with prolonged talking, but  denies dyspnea otherwise.  Cough appear to be worse last night, no apparent changes today.    Treatment consisted of ibuprofen x1 yesterday.  Able to tolerate fluids.  Denies history of asthma or lung disease, immunosuppression, chronic kidney disease.   History of migraine headaches. Did not receive flu vaccine this year/season.  Parents in their 50s at dinner 2 nights ago, they do not have any symptoms.   Other family members at home, husband who has no symptoms at this time. Sister who was traveling with her to [place] has similar symptoms and was seen at a medical base in [place].  Does not think coronavirus testing was performed.   Patient Active Problem List   Diagnosis Date Noted  . Situational anxiety 11/08/2015  . Palpitations 06/19/2013  . Dyspnea 06/19/2013  . Retrocecal appendicitis 07/21/2012   Past Medical History:  Diagnosis Date  . Anxiety   . Bradycardia   . Depression   . Migraines   . Mononucleosis   . Palpitations    Past Surgical History:  Procedure Laterality Date  . LAPAROSCOPIC APPENDECTOMY N/A 07/21/2012   Procedure: APPENDECTOMY LAPAROSCOPIC;  Surgeon: Currie Paris, MD;  Location: [facility];  Service: General;  Laterality: N/A;  . TONSILLECTOMY    . TONSILLECTOMY     No Known Allergies Prior to Admission medications   Medication Sig Start Date End Date Taking? Authorizing Provider  naratriptan (AMERGE) 2.5 MG tablet Take 2.5 mg by mouth  as needed for migraine. Take one (1) tablet at onset of headache; if returns or does not resolve, may repeat after 4 hours; do not exceed five (5) mg in 24 hours.   Yes [provider]  perphenazine (TRILAFON) 4 MG tablet TAKE 1 TO 2 TABLETS BY MOUTH AS NEEDED EVERY 2-3 HOURS FOR HEADAHCE RESCUE. *MAX 4-6 TABS/24HOURS. 11/18/16  Yes [provider]  topiramate (TOPAMAX) 200 MG tablet Take 200 mg by mouth 2 (two) times daily.   Yes [provider]   Social History   Socioeconomic  History  . Marital status: Married    Spouse name: Not on file  . Number of children: 0  . Years of education: Not on file  . Highest education level: Not on file  Occupational History  . Occupation: Environmental health practitioner: [employer]  Social Needs  . Financial resource strain: Not on file  . Food insecurity:    Worry: Not on file    Inability: Not on file  . Transportation needs:    Medical: Not on file    Non-medical: Not on file  Tobacco Use  . Smoking status: Never Smoker  . Smokeless tobacco: Never Used  Substance and Sexual Activity  . Alcohol use: Yes    Alcohol/week: 2.0 - 3.0 standard drinks    Types: 2 - 3 Standard drinks or equivalent per week  . Drug use: No  . Sexual activity: Yes    Partners: Male    Birth control/protection: Condom  Lifestyle  . Physical activity:    Days per week: Not on file    Minutes per session: Not on file  . Stress: Not on file  Relationships  . Social connections:    Talks on phone: Not on file    Gets together: Not on file    Attends religious service: Not on file    Active member of club or organization: Not on file    Attends meetings of clubs or organizations: Not on file    Relationship status: Not on file  . Intimate partner violence:    Fear of current or ex partner: Not on file    Emotionally abused: Not on file    Physically abused: Not on file    Forced sexual activity: Not on file  Other Topics Concern  . Not on file  Social History Narrative  . Not on file    Review of Systems  Constitutional: Positive for chills, fatigue and fever.  Respiratory: Positive for cough. Negative for shortness of breath (only with talking for prolonged time. no DOE. ), wheezing and stridor.   Neurological: Positive for headaches.       Objective:   Physical Exam Vitals signs reviewed.  Constitutional:      General: She is not in acute distress.    Appearance: Normal appearance. She is well-developed. She is  not ill-appearing, toxic-appearing or diaphoretic.  HENT:     Head: Normocephalic and atraumatic.     Right Ear: Hearing, tympanic membrane, ear canal and external ear normal.     Left Ear: Hearing, tympanic membrane, ear canal and external ear normal.     Nose: Nose normal.     Mouth/Throat:     Pharynx: No oropharyngeal exudate.  Eyes:     Conjunctiva/sclera: Conjunctivae normal.     Pupils: Pupils are equal, round, and reactive to light.  Cardiovascular:     Rate and Rhythm: Normal rate and regular rhythm.  Heart sounds: Normal heart sounds. No murmur.  Pulmonary:     Effort: Pulmonary effort is normal. No respiratory distress.     Breath sounds: Normal breath sounds. No stridor. No wheezing or rhonchi.  Skin:    General: Skin is warm and dry.     Findings: No rash.  Neurological:     Mental Status: She is alert and oriented to person, place, and time.  Psychiatric:        Mood and Affect: Mood normal.        Behavior: Behavior normal.    Vitals:   05/16/18 1421  BP: 102/68  Pulse: 92  Resp: 12  Temp: 98.4 F (36.9 C)  TempSrc: Oral  SpO2: 98%  Weight: 136 lb (61.7 kg)  Height: 5' 6.5" (1.689 m)     Results for orders placed or performed in visit on 05/16/18  POCT Influenza A/B  Result Value Ref Range   Influenza A, POC Negative Negative   Influenza B, POC Negative Negative   Patient placed on mask on arrival to the office, remained in mask throughout visit, except during evaluation of oropharynx and nasopharyngeal swabs. Patient triaged by staff member with PPE, negative pressure room 13.  I evaluated patient with gown, gloves, N95 mask and facemask.    Assessment & Plan:  Lori Rice is a 33 y.o. female Flu-like symptoms - Plan: POCT Influenza A/B, Novel Coronavirus, NAA ([facility]), Novel Coronavirus, NAA ([facility])  Fever, unspecified - Plan: Novel Coronavirus, NAA ([facility]), Novel Coronavirus, NAA ([facility])  Cough - Plan: Novel Coronavirus, NAA  ([facility]), Novel Coronavirus, NAA ([facility])  Fatigue, unspecified type - Plan: Novel Coronavirus, NAA ([facility]), Novel Coronavirus, NAA ([facility])  Flulike illness as described above, but recent travel to [place] area of [place].  Possible exposure to coronavirus with individual at a book signing who reportedly had positive testing.  Flu testing in office negative.  Nontoxic exam, O2 sat stable.  Speaking in full sentences without respiratory distress.  -Potential coronavirus exposure, testing was performed with nasopharyngeal swab, sent to [facility].  -Isolation precautions discussed at home while waiting for results of testing.  Symptomatic care discussed.  Signature obtained on paperwork reviewing isolation precautions and handout given to patient regarding isolation precautions and home care.  RTC/ER precautions were discussed.  -Person under investigation form completed from [facility], faxed to infection prevention.  No orders of the defined types were placed in this encounter.  Patient Instructions   .  See provided information for isolation precautions for now.  Your exam is reassuring at this time, but will check  the coronavirus test as we discussed.  If any of your recent contacts do get sick, I would recommend they get evaluated to determine if testing needed as well.  Mucinex if needed for cough, Tylenol or ibuprofen if needed for fever care, body aches and headaches.  Make sure to drink plenty of fluids and rest.  I expect some results with your testing in the next 3 to 4 days at the most.  Let me know if there are questions prior to that time. If you have acute worsening symptoms, proceed to nearest emergency room, but to make sure you advise them that you are being tested for possible coronavirus exposure.      Coronavirus (COVID-19) Are you at risk?  Are you at risk for the Coronavirus (COVID-19)?  To be considered HIGH RISK for Coronavirus (COVID-19), you have to meet the  following criteria:  . Traveled to [place],  [place], [place], [place] or [place]; or in the [place] to [place], [place], [place], or [place]; and have fever, cough, and shortness of breath within the last 2 weeks of travel OR . Been in close contact with a person diagnosed with COVID-19 within the last 2 weeks and have fever, cough, and shortness of breath . IF YOU DO NOT MEET THESE CRITERIA, YOU ARE CONSIDERED LOW RISK FOR COVID-19.  What to do if you are HIGH RISK for COVID-19?  [name] If you are having a medical emergency, call 911. . Seek medical care right away. Before you go to a doctor's office, urgent care or emergency department, call ahead and tell them about your recent travel, contact with someone diagnosed with COVID-19, and your symptoms. You should receive instructions from your physician's office regarding next steps of care.  . When you arrive at healthcare provider, tell the healthcare staff immediately you have returned from visiting [place], [place], [place], [place] or [place]; or traveled in the [place] to [place], [place], [place], or [place]; in the last two weeks or you have been in close contact with a person diagnosed with COVID-19 in the last 2 weeks.   . Tell the health care staff about your symptoms: fever, cough and shortness of breath. . After you have been seen by a medical provider, you will be either: o Tested for (COVID-19) and discharged home on quarantine except to seek medical care if symptoms worsen, and asked to  - Stay home and avoid contact with others until you get your results (4-5 days)  - Avoid travel on public transportation if possible (such as bus, train, or airplane) or o Sent to the Emergency Department by EMS for evaluation, COVID-19 testing, and possible admission depending on your condition and test results.  What to do if you are LOW RISK for COVID-19?  Reduce your risk of any infection by using the same precautions used  for avoiding the common cold or flu:  [name] Wash your hands often with soap and warm water for at least 20 seconds.  If soap and water are not readily available, use an alcohol-based hand sanitizer with at least 60% alcohol.  . If coughing or sneezing, cover your mouth and nose by coughing or sneezing into the elbow areas of your shirt or coat, into a tissue or into your sleeve (not your hands). . Avoid shaking hands with others and consider head nods or verbal greetings only. . Avoid touching your eyes, nose, or mouth with unwashed hands.  . Avoid close contact with people who are sick. . Avoid places or events with large numbers of people in one location, like concerts or sporting events. . Carefully consider travel plans you have or are making. . If you are planning any travel outside or inside the [place], visit the CDC's Travelers' Health webpage for the latest health notices. . If you have some symptoms but not all symptoms, continue to monitor at home and seek medical attention if your symptoms worsen. . If you are having a medical emergency, call 911.   ADDITIONAL HEALTHCARE OPTIONS FOR PATIENTS  [facility] / e-Visit: [email or web address]         [facility]: [phone]  [facility]: [phone]                   [facility]: [phone]     If you have lab work done  today you will be contacted with your lab results within the next 2 weeks.  If you have not heard from [place] then please contact us. The fastest way to get your results is to register for My Chart.   IF you received an x-ray today, you will receive an invoice from [facility]. Please contact [facility] at [phone] with questions or concerns regarding your invoice.   IF you received labwork today, you will receive an invoice from [facility]. Please contact [facility] at [phone] with questions or concerns  regarding your invoice.   Our billing staff will not be able to assist you with questions regarding bills from these companies.  You will be contacted with the lab results as soon as they are available. The fastest way to get your results is to activate your My Chart account. Instructions are located on the last page of this paperwork. If you have not heard from [place] regarding the results in 2 weeks, please contact this office.       Signed,   Meredith Staggers, MD Primary Care at [facility].  05/16/18 8:30 PM

## 2018-05-16 NOTE — Patient Instructions (Addendum)
.  See provided information for isolation precautions for now.  Your exam is reassuring at this time, but will check  the coronavirus test as we discussed.  If any of your recent contacts do get sick, I would recommend they get evaluated to determine if testing needed as well.  Mucinex if needed for cough, Tylenol or ibuprofen if needed for fever care, body aches and headaches.  Make sure to drink plenty of fluids and rest.  I expect some results with your testing in the next 3 to 4 days at the most.  Let me know if there are questions prior to that time. If you have acute worsening symptoms, proceed to nearest emergency room, but to make sure you advise them that you are being tested for possible coronavirus exposure.      Coronavirus (COVID-19) Are you at risk?  Are you at risk for the Coronavirus (COVID-19)?  To be considered HIGH RISK for Coronavirus (COVID-19), you have to meet the following criteria:  . Traveled to [place], [place], [place], [place] or [place]; or in the [place] to [place], [place], [place], or [place]; and have fever, cough, and shortness of breath within the last 2 weeks of travel OR . Been in close contact with a person diagnosed with COVID-19 within the last 2 weeks and have fever, cough, and shortness of breath . IF YOU DO NOT MEET THESE CRITERIA, YOU ARE CONSIDERED LOW RISK FOR COVID-19.  What to do if you are HIGH RISK for COVID-19?  [name] If you are having a medical emergency, call 911. . Seek medical care right away. Before you go to a doctor's office, urgent care or emergency department, call ahead and tell them about your recent travel, contact with someone diagnosed with COVID-19, and your symptoms. You should receive instructions from your physician's office regarding next steps of care.  . When you arrive at healthcare provider, tell the healthcare staff immediately you have returned from visiting [place], [place], [place], [place] or [place]; or traveled  in the [place] to [place], [place], [place], or [place]; in the last two weeks or you have been in close contact with a person diagnosed with COVID-19 in the last 2 weeks.   . Tell the health care staff about your symptoms: fever, cough and shortness of breath. . After you have been seen by a medical provider, you will be either: o Tested for (COVID-19) and discharged home on quarantine except to seek medical care if symptoms worsen, and asked to  - Stay home and avoid contact with others until you get your results (4-5 days)  - Avoid travel on public transportation if possible (such as bus, train, or airplane) or o Sent to the Emergency Department by EMS for evaluation, COVID-19 testing, and possible admission depending on your condition and test results.  What to do if you are LOW RISK for COVID-19?  Reduce your risk of any infection by using the same precautions used for avoiding the common cold or flu:  [name] Wash your hands often with soap and warm water for at least 20 seconds.  If soap and water are not readily available, use an alcohol-based hand sanitizer with at least 60% alcohol.  . If coughing or sneezing, cover your mouth and nose by coughing or sneezing into the elbow areas of your shirt or coat, into a tissue or into your sleeve (not your hands). . Avoid shaking hands with others and consider  head nods or verbal greetings only. . Avoid touching your eyes, nose, or mouth with unwashed hands.  . Avoid close contact with people who are sick. . Avoid places or events with large numbers of people in one location, like concerts or sporting events. . Carefully consider travel plans you have or are making. . If you are planning any travel outside or inside the [place], visit the CDC's Travelers' Health webpage for the latest health notices. . If you have some symptoms but not all symptoms, continue to monitor at home and seek medical attention if your symptoms worsen. . If you are  having a medical emergency, call 911.   ADDITIONAL HEALTHCARE OPTIONS FOR PATIENTS  [facility] / e-Visit: [email or web address]         [facility]: [phone]  [facility]: [phone]                   [facility]: [phone]     If you have lab work done today you will be contacted with your lab results within the next 2 weeks.  If you have not heard from [place] then please contact us. The fastest way to get your results is to register for My Chart.   IF you received an x-ray today, you will receive an invoice from [facility]. Please contact [facility] at [phone] with questions or concerns regarding your invoice.   IF you received labwork today, you will receive an invoice from [facility]. Please contact [facility] at [phone] with questions or concerns regarding your invoice.   Our billing staff will not be able to assist you with questions regarding bills from these companies.  You will be contacted with the lab results as soon as they are available. The fastest way to get your results is to activate your My Chart account. Instructions are located on the last page of this paperwork. If you have not heard from [place] regarding the results in 2 weeks, please contact this office.

## 2018-05-21 ENCOUNTER — Telehealth: Payer: Self-pay | Admitting: Family Medicine

## 2018-05-21 LAB — NOVEL CORONAVIRUS, NAA: SARS-CoV-2, NAA: DETECTED — AB

## 2018-05-21 LAB — SPECIMEN STATUS REPORT

## 2018-05-21 NOTE — Telephone Encounter (Signed)
Call received from lab core with positive Covid 19 test.  Contacted infection prevention, discussed with [name].  Infection prevention will contact [facility] department he will then follow-up with patient as well.  Contacted Lori Rice.  Advised of positive results.  She notes that her sister who had been on the trip but lives out of state also received a positive test.   She is feeling better, no fever in the past 5 days, no cough today, no dyspnea.  She does state her cough increased the first few days but energy was improving.  Dry sinuses with some decreased taste as well initially, but overall is feeling much better.  She has remained in self quarantine.  Only other contact at home is her husband and he has been feeling okay, no fever or specific symptoms.  Advised patient to continue self quarantine for 14 days, minimum 3 days after fevers have resolved off antipyretics, and 7 days after initial symptoms.  Advised she will be contacted by [facility] department.  If any worsening symptoms, can contact me, or ER precautions.

## 2018-05-22 ENCOUNTER — Encounter: Payer: Self-pay | Admitting: Family Medicine

## 2018-11-08 DIAGNOSIS — Z315 Encounter for genetic counseling: Secondary | ICD-10-CM | POA: Insufficient documentation

## 2018-12-02 ENCOUNTER — Other Ambulatory Visit: Payer: Self-pay

## 2018-12-06 ENCOUNTER — Ambulatory Visit (INDEPENDENT_AMBULATORY_CARE_PROVIDER_SITE_OTHER): Payer: No Typology Code available for payment source | Admitting: Certified Nurse Midwife

## 2018-12-06 ENCOUNTER — Other Ambulatory Visit: Payer: Self-pay

## 2018-12-06 ENCOUNTER — Encounter: Payer: Self-pay | Admitting: Certified Nurse Midwife

## 2018-12-06 ENCOUNTER — Other Ambulatory Visit (HOSPITAL_COMMUNITY)
Admission: RE | Admit: 2018-12-06 | Discharge: 2018-12-06 | Disposition: A | Discharge status: Home / Self Care | Payer: No Typology Code available for payment source | Source: Ambulatory Visit | Attending: Obstetrics & Gynecology | Admitting: Obstetrics & Gynecology

## 2018-12-06 VITALS — BP 90/60 | HR 68 | Temp 97.1°F | Resp 16 | Ht 66.25 in | Wt 141.0 lb

## 2018-12-06 DIAGNOSIS — Z124 Encounter for screening for malignant neoplasm of cervix: Secondary | ICD-10-CM | POA: Diagnosis not present

## 2018-12-06 DIAGNOSIS — Z01419 Encounter for gynecological examination (general) (routine) without abnormal findings: Secondary | ICD-10-CM | POA: Diagnosis not present

## 2018-12-06 NOTE — Progress Notes (Signed)
33 y.o. G0P0000 Married  Caucasian Fe here for annual exam. Periods normal every 31 days, duration 5 days, moderate to light. No missed periods in last year. Planning for pregnancy soon. Spouse and patient have undergone genetic screening due spouse with blindness with RP and will have consultation soon. Taking prenatal vitamins now. Plans to start tracking ovulation. Patient had Covid 19 in March along with spouse. Full  Recovery no hospitalization. No other health concerns today.  Patient's last menstrual period was 12/01/2018 (exact date).          Sexually active: Yes.    The current method of family planning is none.    Exercising: No.  exercise Smoker:  no  Review of Systems  Constitutional: Negative.   HENT: Negative.   Eyes: Negative.   Respiratory: Negative.   Cardiovascular: Negative.   Gastrointestinal: Negative.   Genitourinary: Negative.   Musculoskeletal: Negative.   Skin: Negative.   Neurological: Negative.   Endo/Heme/Allergies: Negative.   Psychiatric/Behavioral: Negative.     Health Maintenance: Pap:  11-19-15 neg History of Abnormal Pap: no MMG:  none Self Breast exams: yes Colonoscopy:  none BMD:   none TDaP:  2017 Shingles: no Pneumonia: no Hep C and HIV: not done Labs: declined   reports that she has never smoked. She has never used smokeless tobacco. She reports current alcohol use of about 2.0 - 3.0 standard drinks of alcohol per week. She reports that she does not use drugs.  Past Medical History:  Diagnosis Date  . Anxiety   . Bradycardia   . Depression   . Migraines   . Mononucleosis   . Palpitations     Past Surgical History:  Procedure Laterality Date  . LAPAROSCOPIC APPENDECTOMY N/A 07/21/2012   Procedure: APPENDECTOMY LAPAROSCOPIC;  Surgeon: Currie Paris, MD;  Location: [facility];  Service: General;  Laterality: N/A;  . TONSILLECTOMY    . TONSILLECTOMY      Current Outpatient Medications  Medication Sig Dispense Refill  .  Prenatal Vit-Fe Fumarate-FA (PRENATAL VITAMIN PO) Take by mouth.     No current facility-administered medications for this visit.     Family History  Problem Relation Age of Onset  . Breast cancer Mother   . Lung cancer Paternal Grandfather   . Heart disease Paternal Grandfather   . Hypertension Maternal Grandmother   . Bladder Cancer Maternal Grandfather   . Hypertension Maternal Grandfather   . Stroke Maternal Grandfather   . Heart disease Maternal Grandfather   . Heart failure Maternal Grandfather   . Breast cancer Paternal Grandmother     ROS:  Pertinent items are noted in HPI.  Otherwise, a comprehensive ROS was negative.  Exam:   BP 90/60   Pulse 68   Temp (!) 97.1 F (36.2 C) (Skin)   Resp 16   Ht 5' 6.25" (1.683 m)   Wt 141 lb (64 kg)   LMP 12/01/2018 (Exact Date)   BMI 22.59 kg/m  Height: 5' 6.25" (168.3 cm) Ht Readings from Last 3 Encounters:  12/06/18 5' 6.25" (1.683 m)  05/16/18 5' 6.5" (1.689 m)  12/01/17 5' 6.25" (1.683 m)    General appearance: alert, cooperative and appears stated age Head: Normocephalic, without obvious abnormality, atraumatic Neck: no adenopathy, supple, symmetrical, trachea midline and thyroid normal to inspection and palpation Lungs: clear to auscultation bilaterally Breasts: normal appearance, no masses or tenderness, No nipple retraction or dimpling, No nipple discharge or bleeding, No axillary or supraclavicular adenopathy Heart: regular rate  and rhythm Abdomen: soft, non-tender; no masses,  no organomegaly Extremities: extremities normal, atraumatic, no cyanosis or edema Skin: Skin color, texture, turgor normal. No rashes or lesions Lymph nodes: Cervical, supraclavicular, and axillary nodes normal. No abnormal inguinal nodes palpated Neurologic: Grossly normal   Pelvic: External genitalia:  no lesions              Urethra:  normal appearing urethra with no masses, tenderness or lesions              Bartholin's and Skene's:  normal                 Vagina: normal appearing vagina with normal color and discharge, no lesions              Cervix: no cervical motion tenderness, no lesions and nulliparous appearance              Pap taken: Yes.   Bimanual Exam:  Uterus:  normal size, contour, position, consistency, mobility, non-tender and anteverted              Adnexa: normal adnexa and no mass, fullness, tenderness               Rectovaginal: Confirms               Anus:  normal sphincter tone, no lesions  Chaperone present: yes  A:  Well Woman with normal exam  Contraception none trying for pregnancy  History of Covid 19 infection with normal recovery  Rubella immune status screening declined  P:   Reviewed health and wellness pertinent to exam  Discussed importance of prenatal vitamins daily, avoid ETOH use, good diet. Discussed BBT graph for ovulation tracking and handout given. Healthy pregnancy pamphlet given. Patient to call with positive pregnancy test and will come in for confirmation. Questions addressed at length.  Pap smear: yes   counseled on breast self exam, feminine hygiene, family planning choices, adequate intake of calcium and vitamin D, diet and exercise  return annually or prn  An After Visit Summary was printed and given to the patient.

## 2018-12-06 NOTE — Patient Instructions (Addendum)
EXERCISE AND DIET:  We recommended that you start or continue a regular exercise program for good health. Regular exercise means any activity that makes your heart beat faster and makes you sweat.  We recommend exercising at least 30 minutes per day at least 3 days a week, preferably 4 or 5.  We also recommend a diet low in fat and sugar.  Inactivity, poor dietary choices and obesity can cause diabetes, heart attack, stroke, and kidney damage, among others.    ALCOHOL AND SMOKING:  Women should limit their alcohol intake to no more than 7 drinks/beers/glasses of wine (combined, not each!) per week. Moderation of alcohol intake to this level decreases your risk of breast cancer and liver damage. And of course, no recreational drugs are part of a healthy lifestyle.  And absolutely no smoking or even second hand smoke. Most people know smoking can cause heart and lung diseases, but did you know it also contributes to weakening of your bones? Aging of your skin?  Yellowing of your teeth and nails?  CALCIUM AND VITAMIN D:  Adequate intake of calcium and Vitamin D are recommended.  The recommendations for exact amounts of these supplements seem to change often, but generally speaking 600 mg of calcium (either carbonate or citrate) and 800 units of Vitamin D per day seems prudent. Certain women may benefit from higher intake of Vitamin D.  If you are among these women, your doctor will have told you during your visit.    PAP SMEARS:  Pap smears, to check for cervical cancer or precancers,  have traditionally been done yearly, although recent scientific advances have shown that most women can have pap smears less often.  However, every woman still should have a physical exam from her gynecologist every year. It will include a breast check, inspection of the vulva and vagina to check for abnormal growths or skin changes, a visual exam of the cervix, and then an exam to evaluate the size and shape of the uterus and  ovaries.  And after 31 years of age, a rectal exam is indicated to check for rectal cancers. We will also provide age appropriate advice regarding health maintenance, like when you should have certain vaccines, screening for sexually transmitted diseases, bone density testing, colonoscopy, mammograms, etc.   MAMMOGRAMS:  All women over 40 years old should have a yearly mammogram. Many facilities now offer a "3D" mammogram, which may cost around $50 extra out of pocket. If possible,  we recommend you accept the option to have the 3D mammogram performed.  It both reduces the number of women who will be called back for extra views which then turn out to be normal, and it is better than the routine mammogram at detecting truly abnormal areas.    COLONOSCOPY:  Colonoscopy to screen for colon cancer is recommended for all women at age 50.  We know, you hate the idea of the prep.  We agree, BUT, having colon cancer and not knowing it is worse!!  Colon cancer so often starts as a polyp that can be seen and removed at colonscopy, which can quite literally save your life!  And if your first colonoscopy is normal and you have no family history of colon cancer, most women don't have to have it again for 10 years.  Once every ten years, you can do something that may end up saving your life, right?  We will be happy to help you get it scheduled when you are ready.    Be sure to check your insurance coverage so you understand how much it will cost.  It may be covered as a preventative service at no cost, but you should check your particular policy.      Prenatal Care Prenatal care is health care during pregnancy. It helps you and your unborn baby (fetus) stay as healthy as possible. Prenatal care may be provided by a midwife, a family practice health care provider, or a childbirth and pregnancy specialist (obstetrician). How does this affect me? During pregnancy, you will be closely monitored for any new conditions that  might develop. To lower your risk of pregnancy complications, you and your health care provider will talk about any underlying conditions you have. How does this affect my baby? Early and consistent prenatal care increases the chance that your baby will be healthy during pregnancy. Prenatal care lowers the risk that your baby will be:  Born early (prematurely).  Smaller than expected at birth (small for gestational age). What can I expect at the first prenatal care visit? Your first prenatal care visit will likely be the longest. You should schedule your first prenatal care visit as soon as you know that you are pregnant. Your first visit is a good time to talk about any questions or concerns you have about pregnancy. At your visit, you and your health care provider will talk about:  Your medical history, including: ? Any past pregnancies. ? Your family's medical history. ? The baby's father's medical history. ? Any long-term (chronic) health conditions you have and how you manage them. ? Any surgeries or procedures you have had. ? Any current over-the-counter or prescription medicines, herbs, or supplements you are taking.  Other factors that could pose a risk to your baby, including:  Your home setting and your stress levels, including: ? Exposure to abuse or violence. ? Household financial strain. ? Mental health conditions you have.  Your daily health habits, including diet and exercise. Your health care provider will also:  Measure your weight, height, and blood pressure.  Do a physical exam, including a pelvic and breast exam.  Perform blood tests and urine tests to check for: ? Urinary tract infection. ? Sexually transmitted infections (STIs). ? Low iron levels in your blood (anemia). ? Blood type and certain proteins on red blood cells (Rh antibodies). ? Infections and immunity to viruses, such as hepatitis B and rubella. ? HIV (human immunodeficiency virus).  Do an  ultrasound to confirm your baby's growth and development and to help predict your estimated due date (EDD). This ultrasound is done with a probe that is inserted into the vagina (transvaginal ultrasound).  Discuss your options for genetic screening.  Give you information about how to keep yourself and your baby healthy, including: ? Nutrition and taking vitamins. ? Physical activity. ? How to manage pregnancy symptoms such as nausea and vomiting (morning sickness). ? Infections and substances that may be harmful to your baby and how to avoid them. ? Food safety. ? Dental care. ? Working. ? Travel. ? Warning signs to watch for and when to call your health care provider. How often will I have prenatal care visits? After your first prenatal care visit, you will have regular visits throughout your pregnancy. The visit schedule is often as follows:  Up to week 28 of pregnancy: once every 4 weeks.  28-36 weeks: once every 2 weeks.  After 36 weeks: every week until delivery. Some women may have visits more or less often depending  on any underlying health conditions and the health of the baby. Keep all follow-up and prenatal care visits as told by your health care provider. This is important. What happens during routine prenatal care visits? Your health care provider will:  Measure your weight and blood pressure.  Check for fetal heart sounds.  Measure the height of your uterus in your abdomen (fundal height). This may be measured starting around week 20 of pregnancy.  Check the position of your baby inside your uterus.  Ask questions about your diet, sleeping patterns, and whether you can feel the baby move.  Review warning signs to watch for and signs of labor.  Ask about any pregnancy symptoms you are having and how you are dealing with them. Symptoms may include: ? Headaches. ? Nausea and vomiting. ? Vaginal discharge. ? Swelling. ? Fatigue. ? Constipation. ? Any discomfort,  including back or pelvic pain. Make a list of questions to ask your health care provider at your routine visits. What tests might I have during prenatal care visits? You may have blood, urine, and imaging tests throughout your pregnancy, such as:  Urine tests to check for glucose, protein, or signs of infection.  Glucose tests to check for a form of diabetes that can develop during pregnancy (gestational diabetes mellitus). This is usually done around week 24 of pregnancy.  An ultrasound to check your baby's growth and development and to check for birth defects. This is usually done around week 20 of pregnancy.  A test to check for group B strep (GBS) infection. This is usually done around week 36 of pregnancy.  Genetic testing. This may include blood or imaging tests, such as an ultrasound. Some genetic tests are done during the first trimester and some are done during the second trimester. What else can I expect during prenatal care visits? Your health care provider may recommend getting certain vaccines during pregnancy. These may include:  A yearly flu shot (annual influenza vaccine). This is especially important if you will be pregnant during flu season.  Tdap (tetanus, diphtheria, pertussis) vaccine. Getting this vaccine during pregnancy can protect your baby from whooping cough (pertussis) after birth. This vaccine may be recommended between weeks 27 and 36 of pregnancy. Later in your pregnancy, your health care provider may give you information about:  Childbirth and breastfeeding classes.  Choosing a health care provider for your baby.  Umbilical cord banking.  Breastfeeding.  Birth control after your baby is born.  The hospital labor and delivery unit and how to tour it.  Registering at the hospital before you go into labor. Where to find more information  Office on Women's Health: LegalWarrants.gl  American Pregnancy Association: americanpregnancy.org  March of  Dimes: marchofdimes.org Summary  Prenatal care helps you and your baby stay as healthy as possible during pregnancy.  Your first prenatal care visit will most likely be the longest.  You will have visits and tests throughout your pregnancy to monitor your health and your baby's health.  Bring a list of questions to your visits to ask your health care provider.  Make sure to keep all follow-up and prenatal care visits with your health care provider. This information is not intended to replace advice given to you by your health care provider. Make sure you discuss any questions you have with your health care provider. Document Released: 02/19/2003 Document Revised: 06/08/2018 Document Reviewed: 02/15/2017 Elsevier Patient Education  2020 [employer].

## 2018-12-13 LAB — CYTOLOGY - PAP
Diagnosis: NEGATIVE
High risk HPV: NEGATIVE

## 2019-01-02 ENCOUNTER — Telehealth: Payer: Self-pay | Admitting: Certified Nurse Midwife

## 2019-01-02 NOTE — Telephone Encounter (Signed)
Spoke with pt. Pt wants to have office visit to confirm pregnancy. OV appt made 11/4@9am .  Routing to provider for final review. Patient is agreeable to disposition. Will close encounter.

## 2019-01-02 NOTE — Telephone Encounter (Signed)
Patient would like to schedule appointment for pregnancy confirmation.

## 2019-01-04 ENCOUNTER — Telehealth: Payer: Self-pay | Admitting: Certified Nurse Midwife

## 2019-01-04 ENCOUNTER — Ambulatory Visit (INDEPENDENT_AMBULATORY_CARE_PROVIDER_SITE_OTHER): Payer: No Typology Code available for payment source | Admitting: Certified Nurse Midwife

## 2019-01-04 ENCOUNTER — Encounter: Payer: Self-pay | Admitting: Certified Nurse Midwife

## 2019-01-04 ENCOUNTER — Other Ambulatory Visit: Payer: Self-pay

## 2019-01-04 ENCOUNTER — Telehealth: Payer: Self-pay | Admitting: *Deleted

## 2019-01-04 ENCOUNTER — Other Ambulatory Visit: Payer: Self-pay | Admitting: Certified Nurse Midwife

## 2019-01-04 VITALS — BP 104/64 | HR 68 | Temp 97.1°F | Resp 16 | Wt 146.0 lb

## 2019-01-04 DIAGNOSIS — Z3201 Encounter for pregnancy test, result positive: Secondary | ICD-10-CM | POA: Diagnosis not present

## 2019-01-04 DIAGNOSIS — N912 Amenorrhea, unspecified: Secondary | ICD-10-CM

## 2019-01-04 LAB — POCT URINE PREGNANCY: Preg Test, Ur: POSITIVE — AB

## 2019-01-04 LAB — BETA HCG QUANT (REF LAB): hCG Quant: 23 m[IU]/mL

## 2019-01-04 NOTE — Telephone Encounter (Signed)
Patient sent the following message through [place]. Routing to triage to assist patient with request.  Lori Rice, Lori Rice Clinical Pool  Phone Number: [phone]        Lori Alar-   Here is my Blood Donor card I was asked to share with you showing Im A-.   Thank you-   Lori Rice

## 2019-01-04 NOTE — Telephone Encounter (Signed)
Spoke with patient. Advised per Melvia Heaps, CNM.   Lab appt scheduled for STAT beta Hcg on 01/06/19 at 9:45am.   Patient states her blood type is A positive, she will upload copy of blood donor card to [facility] to update chart.  Briefly reviewed negative blood type and rhogam.   Patient denies vaginal bleeding or pain, ER precautions reviewed.   Patient verbalizes understanding and is agreeable.   Routing to provider for final review. Patient is agreeable to disposition. Will close encounter.

## 2019-01-04 NOTE — Telephone Encounter (Signed)
-----   Message from Lori Rice, CNM sent at 01/04/2019 12:31 PM EST ----- Regarding: HCG results Please notify patient of HCG level of 23 which is slightly low for her estimated gestation, but may be earlier than date reflects. Patient needs to come back for repeat in 2 days to see if level is rising. Schedule early appointment.  Is she aware of her blood type, if not would consider drawing at the same time. Order placed

## 2019-01-04 NOTE — Patient Instructions (Signed)

## 2019-01-04 NOTE — Telephone Encounter (Signed)
Patient returned call

## 2019-01-04 NOTE — Telephone Encounter (Signed)
Call to patient, mailbox full, unable to leave message.  

## 2019-01-04 NOTE — Progress Notes (Signed)
33 y.o.marital white female g0p0 presents with amenorrhea with faint + UPT x 4 on 12/31/2018 . LMP 12/01/2018. Planned pregnancy. Complaining of breast tenderness, slight fatigue only. Denies spotting, bleeding or cramping. Medications she is taking are: prenatal vitamins. Patient has not consumed alcohol since + UPT. Spouse supportive.  Review of Systems  Constitutional: Negative.   HENT: Negative.   Eyes: Negative.   Respiratory: Negative.   Cardiovascular: Negative.   Gastrointestinal: Negative.   Genitourinary: Negative.        Amenorrhea with positive UPT  Musculoskeletal: Negative.   Skin: Negative.   Neurological: Negative.   Endo/Heme/Allergies: Negative.     O: HPI pertinent to above. Healthy WDWN female Affect: normal, orientation x 3  Last Aex: 12/06/2018 Pap smear: negative HPV not detected   Rubella screen: not done  A: Amenorrhea with faint positive UPT  5 wk 4 days per [facility] with [facility] 09/08/2019 Planned  pregnancy   P: Reviewed with patient importance of prenatal care during pregnancy. Given OB provider list. Reviewed nutrition importance of pregnancy and selecting from all food groups and making sure to have adequate protein intake daily. Discussed avoiding raw or exotic fish, soft cheeses due to risk of bacteria . Discussed concerns with FAS with alcohol use in pregnancy. Discussed increase of IUGR and SIDS with smoking use or second smoke. Reviewed warning signs of early pregnancy and need to advise if occurs. Discussed comfort measures for early pregnancy changes. Offered viability PUS here prior to initiating prenatal care. Patient  plans to have PUS. She will be called with insurance information and scheduled when needed, depending on lab. Questions addressed at length. Will draw HCG quantitative level today due to faint positive UPT. Discussed may just be earlier gestation than dates reflect. Patient agreeable. Will advise of level once in.  Labs: HCG  quantitative  Rv prn  Time spent with patient in face to face counseling regarding pregnancy and prenatal care was 22 minutes

## 2019-01-06 ENCOUNTER — Other Ambulatory Visit (INDEPENDENT_AMBULATORY_CARE_PROVIDER_SITE_OTHER): Payer: No Typology Code available for payment source

## 2019-01-06 ENCOUNTER — Other Ambulatory Visit: Payer: Self-pay

## 2019-01-06 ENCOUNTER — Other Ambulatory Visit: Payer: Self-pay | Admitting: *Deleted

## 2019-01-06 DIAGNOSIS — Z3201 Encounter for pregnancy test, result positive: Secondary | ICD-10-CM

## 2019-01-06 DIAGNOSIS — N912 Amenorrhea, unspecified: Secondary | ICD-10-CM

## 2019-01-06 LAB — BETA HCG QUANT (REF LAB): hCG Quant: 33 m[IU]/mL

## 2019-01-09 ENCOUNTER — Telehealth: Payer: Self-pay | Admitting: Obstetrics and Gynecology

## 2019-01-09 ENCOUNTER — Ambulatory Visit (INDEPENDENT_AMBULATORY_CARE_PROVIDER_SITE_OTHER): Payer: No Typology Code available for payment source | Admitting: Certified Nurse Midwife

## 2019-01-09 ENCOUNTER — Encounter: Payer: Self-pay | Admitting: Certified Nurse Midwife

## 2019-01-09 ENCOUNTER — Other Ambulatory Visit: Payer: Self-pay

## 2019-01-09 VITALS — BP 118/62 | HR 80 | Temp 97.3°F | Resp 16 | Ht 66.0 in | Wt 144.0 lb

## 2019-01-09 DIAGNOSIS — Z3201 Encounter for pregnancy test, result positive: Secondary | ICD-10-CM | POA: Diagnosis not present

## 2019-01-09 DIAGNOSIS — O280 Abnormal hematological finding on antenatal screening of mother: Secondary | ICD-10-CM | POA: Diagnosis not present

## 2019-01-09 DIAGNOSIS — N912 Amenorrhea, unspecified: Secondary | ICD-10-CM | POA: Diagnosis not present

## 2019-01-09 DIAGNOSIS — O0281 Inappropriate change in quantitative human chorionic gonadotropin (hCG) in early pregnancy: Secondary | ICD-10-CM

## 2019-01-09 LAB — BETA HCG QUANT (REF LAB): hCG Quant: 104 m[IU]/mL

## 2019-01-09 NOTE — Telephone Encounter (Signed)
Pt called per Dr Talbert [name] with beta HCG results. Will cancel 11/10 U/S per Dr Talbert [name] and will have pt come back in on 11/11 for repeat [facility] lab. Pt scheduled for repeat BetaHCG on 01/11/19 at 0905. Pt agrees with plan.  Will route to Dr Talbert [name] and Johny Shock, CNM for review. Will close encounter.

## 2019-01-09 NOTE — Telephone Encounter (Signed)
01/09/19 Beta Hcg pending  Routing to Melvia Heaps, CNM  CC: [name] [name]

## 2019-01-09 NOTE — Progress Notes (Signed)
Review of Systems  Constitutional: Negative.   HENT: Negative.   Eyes: Negative.   Respiratory: Negative.   Cardiovascular: Negative.   Gastrointestinal: Negative.   Genitourinary: Negative.   Musculoskeletal: Negative.   Skin: Negative.   Neurological: Negative.   Endo/Heme/Allergies: Negative.   Psychiatric/Behavioral: Negative.    S: 33 yo g1pooo white married female  here for follow up HCG lab today and pelvic exam. + UPT here 01/04/2019 with consultation. HCG level was 23, repeat 2 days later HCG 33. Denies any vaginal bleeding or pain. Patient concerned about outcome. Has two trips upcoming with family. Eating well, no nausea. Breast tenderness has increased. Emotionally worried, but no other concerns. Physical Exam Exam conducted with a chaperone present.  Constitutional:      Appearance: Normal appearance.  Cardiovascular:     Rate and Rhythm: Normal rate.  Pulmonary:     Effort: Pulmonary effort is normal.  Abdominal:     Palpations: Abdomen is soft.     Tenderness: There is no abdominal tenderness.  Genitourinary:    General: Normal vulva.     Exam position: Lithotomy position.     Labia:        Right: No tenderness or lesion.        Left: No tenderness or lesion.      Vagina: Normal. No vaginal discharge or tenderness.     Cervix: Normal.     Uterus: Normal. Not tender.      Adnexa: Right adnexa normal and left adnexa normal.       Right: No mass, tenderness or fullness.         Left: No mass, tenderness or fullness.       Comments: Uterus palpated ?slight change from normal, soft Lymphadenopathy:     Lower Body: No right inguinal adenopathy. No left inguinal adenopathy.  Skin:    General: Skin is warm and dry.  Neurological:     Mental Status: She is alert and oriented to person, place, and time.  Psychiatric:        Attention and Perception: Attention and perception normal.        Mood and Affect: Mood and affect normal.        Behavior: Behavior normal.         Thought Content: Thought content normal.        Cognition and Memory: Cognition normal.        Judgment: Judgment normal.   A: Amenorrhea with Positive (light) UPT and HCG levels not consistent with dating. No vaginal bleeding. Essentially normal pelvic exam, no fullness noted in adnexa  P: Discussed pelvic exam finding and will repeat HCG level today. Discussed concerns for risk of ectopic pregnancy, her risk is low( no history of surgery or chlamydia). Questions addressed at length. Patient has PUS scheduled on 01/10/2019. Precautions reviewed if pelvic pain or cramping or bleeding needs to call and advise. Return for PUS and she will be called with HCG results.  Rv prn, as above

## 2019-01-09 NOTE — Telephone Encounter (Signed)
Spoke with patient in regards to benefit for scheduled ultrasound appointment. Patient acknowledges understanding of information presented, but is concerned about the possibility of having to do a repeat ultrasound. She sates "she feels like it is too early." Patient is asking if there are any other options available.  Routing to Triage Nurse

## 2019-01-09 NOTE — Progress Notes (Deleted)
GYNECOLOGY  VISIT   HPI: 33 y.o.   Married White or Caucasian Not Hispanic or Latino  female   G1P0000 with Patient's last menstrual period was 12/01/2018 (exact date).   here for     GYNECOLOGIC HISTORY: Patient's last menstrual period was 12/01/2018 (exact date). Contraception:*** Menopausal hormone therapy: ***        OB History    Gravida  1   Para  0   Term  0   Preterm  0   AB  0   Living  0     SAB  0   TAB  0   Ectopic  0   Multiple  0   Live Births  0              Patient Active Problem List   Diagnosis Date Noted  . Encounter for procreative genetic counseling 11/08/2018  . Situational anxiety 11/08/2015  . Palpitations 06/19/2013  . Dyspnea 06/19/2013  . Retrocecal appendicitis 07/21/2012    Past Medical History:  Diagnosis Date  . Anxiety   . Bradycardia   . Depression   . Migraines   . Mononucleosis   . Palpitations     Past Surgical History:  Procedure Laterality Date  . LAPAROSCOPIC APPENDECTOMY N/A 07/21/2012   Procedure: APPENDECTOMY LAPAROSCOPIC;  Surgeon: Currie Paris, MD;  Location: [facility];  Service: General;  Laterality: N/A;  . TONSILLECTOMY    . TONSILLECTOMY      Current Outpatient Medications  Medication Sig Dispense Refill  . Prenatal Vit-Fe Fumarate-FA (PRENATAL VITAMIN PO) Take by mouth.     No current facility-administered medications for this visit.      ALLERGIES: Patient has no known allergies.  Family History  Problem Relation Age of Onset  . Breast cancer Mother   . Lung cancer Paternal Grandfather   . Heart disease Paternal Grandfather   . Hypertension Maternal Grandmother   . Bladder Cancer Maternal Grandfather   . Hypertension Maternal Grandfather   . Stroke Maternal Grandfather   . Heart disease Maternal Grandfather   . Heart failure Maternal Grandfather   . Breast cancer Paternal Grandmother     Social History   Socioeconomic History  . Marital status: Married    Spouse name: Not  on file  . Number of children: 0  . Years of education: Not on file  . Highest education level: Not on file  Occupational History  . Occupation: Environmental health practitioner: [employer]  Social Needs  . Financial resource strain: Not on file  . Food insecurity    Worry: Not on file    Inability: Not on file  . Transportation needs    Medical: Not on file    Non-medical: Not on file  Tobacco Use  . Smoking status: Never Smoker  . Smokeless tobacco: Never Used  Substance and Sexual Activity  . Alcohol use: Not Currently  . Drug use: No  . Sexual activity: Yes    Partners: Male    Birth control/protection: None  Lifestyle  . Physical activity    Days per week: Not on file    Minutes per session: Not on file  . Stress: Not on file  Relationships  . Social Musician on phone: Not on file    Gets together: Not on file    Attends religious service: Not on file    Active member of club or organization: Not on file  Attends meetings of clubs or organizations: Not on file    Relationship status: Not on file  . Intimate partner violence    Fear of current or ex partner: Not on file    Emotionally abused: Not on file    Physically abused: Not on file    Forced sexual activity: Not on file  Other Topics Concern  . Not on file  Social History Narrative  . Not on file    ROS  PHYSICAL EXAMINATION:    LMP 12/01/2018 (Exact Date)     General appearance: alert, cooperative and appears stated age Neck: no adenopathy, supple, symmetrical, trachea midline and thyroid {CHL AMB PHY EX THYROID NORM [phone]::"normal to inspection and palpation"} Breasts: {Exam; breast:13139::"normal appearance, no masses or tenderness"} Abdomen: soft, non-tender; non distended, no masses,  no organomegaly  Pelvic: External genitalia:  no lesions              Urethra:  normal appearing urethra with no masses, tenderness or lesions              Bartholins and Skenes:  normal                 Vagina: normal appearing vagina with normal color and discharge, no lesions              Cervix: {CHL AMB PHY EX CERVIX NORM DEFAULT:[phone]::"no lesions"}              Bimanual Exam:  Uterus:  {CHL AMB PHY EX UTERUS NORM [phone]::"normal size, contour, position, consistency, mobility, non-tender"}              Adnexa: {CHL AMB PHY EX ADNEXA NO MASS DEFAULT:[phone]::"no mass, fullness, tenderness"}              Rectovaginal: {yes no:[id number]}.  Confirms.              Anus:  normal sphincter tone, no lesions  Chaperone was present for exam.  ASSESSMENT     PLAN    An After Visit Summary was printed and given to the patient.  *** minutes face to face time of which over 50% was spent in counseling.

## 2019-01-09 NOTE — Patient Instructions (Signed)
Call if any vaginal bleeding or pain. Return for pelvic ultrasound on 01/10/2019. You will be called with results once in. Continuing to be excited for you... Debbi

## 2019-01-10 ENCOUNTER — Other Ambulatory Visit: Payer: Self-pay | Admitting: Obstetrics and Gynecology

## 2019-01-10 ENCOUNTER — Other Ambulatory Visit: Payer: Self-pay

## 2019-01-11 ENCOUNTER — Other Ambulatory Visit: Payer: Self-pay

## 2019-01-11 ENCOUNTER — Telehealth: Payer: Self-pay

## 2019-01-11 ENCOUNTER — Other Ambulatory Visit (INDEPENDENT_AMBULATORY_CARE_PROVIDER_SITE_OTHER): Payer: No Typology Code available for payment source

## 2019-01-11 DIAGNOSIS — Z3201 Encounter for pregnancy test, result positive: Secondary | ICD-10-CM

## 2019-01-11 DIAGNOSIS — N912 Amenorrhea, unspecified: Secondary | ICD-10-CM

## 2019-01-11 LAB — BETA HCG QUANT (REF LAB): hCG Quant: 180 m[IU]/mL

## 2019-01-11 NOTE — Progress Notes (Signed)
Beta HCG quant order placed.

## 2019-01-11 NOTE — Telephone Encounter (Signed)
Spoke with pt. Pt updated on Beta HCG level of 180. Dr Talbert [name] recommended to be seen again for another blood draw on 01/13/19. Pt scheduled lab on 01/13/19 at 0915. Pt agreeable.  Will route to Dr Talbert [name] and Johny Shock for review. Will close encounter.  Future order placed for STAT Beta HCG quant.

## 2019-01-13 ENCOUNTER — Telehealth: Payer: Self-pay

## 2019-01-13 ENCOUNTER — Other Ambulatory Visit (INDEPENDENT_AMBULATORY_CARE_PROVIDER_SITE_OTHER): Payer: No Typology Code available for payment source

## 2019-01-13 ENCOUNTER — Other Ambulatory Visit: Payer: Self-pay

## 2019-01-13 DIAGNOSIS — Z3201 Encounter for pregnancy test, result positive: Secondary | ICD-10-CM

## 2019-01-13 LAB — BETA HCG QUANT (REF LAB): hCG Quant: 356 m[IU]/mL

## 2019-01-13 NOTE — Telephone Encounter (Signed)
Spoke with pt. Pt given Stat Beta HCG results of 356. Pt thankful for results early. Per D.Leonard, CNM, pt will come back for repeat Beta HCG on Monday 11/16. Pt scheduled lab visit on 11/16 at 0905. Arriving at 0900. Pt agreeable.  Future orders placed for lab on 01/16/19. Routing to provider for final review. Patient is agreeable to disposition. Will close encounter.

## 2019-01-16 ENCOUNTER — Other Ambulatory Visit: Payer: Self-pay

## 2019-01-16 ENCOUNTER — Telehealth: Payer: Self-pay | Admitting: *Deleted

## 2019-01-16 ENCOUNTER — Other Ambulatory Visit (INDEPENDENT_AMBULATORY_CARE_PROVIDER_SITE_OTHER): Payer: No Typology Code available for payment source

## 2019-01-16 DIAGNOSIS — Z3201 Encounter for pregnancy test, result positive: Secondary | ICD-10-CM

## 2019-01-16 DIAGNOSIS — N912 Amenorrhea, unspecified: Secondary | ICD-10-CM

## 2019-01-16 DIAGNOSIS — O0281 Inappropriate change in quantitative human chorionic gonadotropin (hCG) in early pregnancy: Secondary | ICD-10-CM

## 2019-01-16 DIAGNOSIS — O280 Abnormal hematological finding on antenatal screening of mother: Secondary | ICD-10-CM

## 2019-01-16 LAB — BETA HCG QUANT (REF LAB): hCG Quant: 822 m[IU]/mL

## 2019-01-16 NOTE — Telephone Encounter (Signed)
Because her rise was a little off, I would recommend that she get an ultrasound when her BhcG hits 2,000. We could try to schedule her for Thursday, but it might be to soon. She could possibly coordinate care where she is going. If she is visiting a family member and they have a recommendation for her for an OB/GYN. We could talk with that office. Where is she going?

## 2019-01-16 NOTE — Telephone Encounter (Signed)
Spoke with patient.  Notified of STAT beta Hcg results, 822. Per [name] [name], Repeat in 48 hrs.  Lab appt scheduled for 01/18/19 at 8:55am.  Patient denies vaginal bleeding or pain.  Patient will be traveling out of town on 01/22/19 for 2 wks. Patient asking if PUS should be completed before or ok to wait until she returns?   Advised patient PUS will likely be based on repeat Hcg results. Will review with [name] [name] and f/u. Patient agreeable.   Routing to [name] [name] to advise on PUS.

## 2019-01-17 NOTE — Telephone Encounter (Signed)
Spoke with patient, advised per [name] [name]. Patient will be traveling to [place] by plane to see her mother in law. Patient will research OB/GYN options in the area she will be traveling to. Will make additional plans once 01/18/19 Bhcg results return on 01/18/19. Patient is MyChart active and will have access to chart.   Routing to Dr. Rosann Auerbach.

## 2019-01-18 ENCOUNTER — Other Ambulatory Visit (INDEPENDENT_AMBULATORY_CARE_PROVIDER_SITE_OTHER): Payer: No Typology Code available for payment source

## 2019-01-18 ENCOUNTER — Other Ambulatory Visit: Payer: Self-pay

## 2019-01-18 ENCOUNTER — Telehealth: Payer: Self-pay | Admitting: Obstetrics and Gynecology

## 2019-01-18 DIAGNOSIS — N912 Amenorrhea, unspecified: Secondary | ICD-10-CM

## 2019-01-18 DIAGNOSIS — O280 Abnormal hematological finding on antenatal screening of mother: Secondary | ICD-10-CM

## 2019-01-18 DIAGNOSIS — O0281 Inappropriate change in quantitative human chorionic gonadotropin (hCG) in early pregnancy: Secondary | ICD-10-CM

## 2019-01-18 DIAGNOSIS — Z3201 Encounter for pregnancy test, result positive: Secondary | ICD-10-CM

## 2019-01-18 LAB — BETA HCG QUANT (REF LAB): hCG Quant: 1457 m[IU]/mL

## 2019-01-18 NOTE — Telephone Encounter (Signed)
Call placed to patient to follow up with benefits for ultrasound.

## 2019-01-18 NOTE — Telephone Encounter (Signed)
-----   Message from Salvadore Dom, MD sent at 01/18/2019  1:13 PM EST ----- Please let the patient know that her BhcG has risen fine. Since she is leaving town, I would recommend having her come in for an ultrasound tomorrow.

## 2019-01-18 NOTE — Progress Notes (Signed)
GYNECOLOGY  VISIT   HPI: 33 y.o.   Married White or Caucasian Not Hispanic or Latino  female   G1P0000 with Patient's last menstrual period was 12/01/2018 (exact date).   here for consult following viability scan. The patient was being followed with slightly irregular BhcG rise. Leaving town for the holidays. No c/o.  GYNECOLOGIC HISTORY: Patient's last menstrual period was 12/01/2018 (exact date). Contraception: None Menopausal hormone therapy: None        OB History    Gravida  1   Para  0   Term  0   Preterm  0   AB  0   Living  0     SAB  0   TAB  0   Ectopic  0   Multiple  0   Live Births  0              Patient Active Problem List   Diagnosis Date Noted  . Encounter for procreative genetic counseling 11/08/2018  . Situational anxiety 11/08/2015  . Palpitations 06/19/2013  . Dyspnea 06/19/2013  . Retrocecal appendicitis 07/21/2012    Past Medical History:  Diagnosis Date  . Anxiety   . Bradycardia   . Depression   . Migraines   . Mononucleosis   . Palpitations     Past Surgical History:  Procedure Laterality Date  . LAPAROSCOPIC APPENDECTOMY N/A 07/21/2012   Procedure: APPENDECTOMY LAPAROSCOPIC;  Surgeon: Currie Paris, MD;  Location: [facility];  Service: General;  Laterality: N/A;  . TONSILLECTOMY    . TONSILLECTOMY      Current Outpatient Medications  Medication Sig Dispense Refill  . Prenatal Vit-Fe Fumarate-FA (PRENATAL VITAMIN PO) Take by mouth.     No current facility-administered medications for this visit.      ALLERGIES: Patient has no known allergies.  Family History  Problem Relation Age of Onset  . Breast cancer Mother   . Lung cancer Paternal Grandfather   . Heart disease Paternal Grandfather   . Hypertension Maternal Grandmother   . Bladder Cancer Maternal Grandfather   . Hypertension Maternal Grandfather   . Stroke Maternal Grandfather   . Heart disease Maternal Grandfather   . Heart failure Maternal  Grandfather   . Breast cancer Paternal Grandmother     Social History   Socioeconomic History  . Marital status: Married    Spouse name: Not on file  . Number of children: 0  . Years of education: Not on file  . Highest education level: Not on file  Occupational History  . Occupation: Environmental health practitioner: [employer]  Social Needs  . Financial resource strain: Not on file  . Food insecurity    Worry: Not on file    Inability: Not on file  . Transportation needs    Medical: Not on file    Non-medical: Not on file  Tobacco Use  . Smoking status: Never Smoker  . Smokeless tobacco: Never Used  Substance and Sexual Activity  . Alcohol use: Not Currently  . Drug use: No  . Sexual activity: Yes    Partners: Male    Birth control/protection: None  Lifestyle  . Physical activity    Days per week: Not on file    Minutes per session: Not on file  . Stress: Not on file  Relationships  . Social Musician on phone: Not on file    Gets together: Not on file    Attends religious  service: Not on file    Active member of club or organization: Not on file    Attends meetings of clubs or organizations: Not on file    Relationship status: Not on file  . Intimate partner violence    Fear of current or ex partner: Not on file    Emotionally abused: Not on file    Physically abused: Not on file    Forced sexual activity: Not on file  Other Topics Concern  . Not on file  Social History Narrative  . Not on file    Review of Systems  Constitutional: Negative.   HENT: Negative.   Eyes: Negative.   Respiratory: Negative.   Cardiovascular: Negative.   Gastrointestinal: Negative.   Genitourinary: Negative.   Musculoskeletal: Negative.   Skin: Negative.   Neurological: Negative.   Endo/Heme/Allergies: Negative.   Psychiatric/Behavioral: Negative.     PHYSICAL EXAMINATION:    BP 112/78 (BP Location: Right Arm, Patient Position: Sitting, Cuff Size:  Normal)   Pulse 76   Temp (!) 97.5 F (36.4 C) (Skin)   Wt 148 lb 6.4 oz (67.3 kg)   LMP 12/01/2018 (Exact Date)   BMI 23.95 kg/m     General appearance: alert, cooperative and appears stated age  Ultrasound images reviewed with the patient, viable IUD c/w 5+6 weeks, EDD 09/15/19  ASSESSMENT Viable IUP    PLAN Establish care with OB   An After Visit Summary was printed and given to the patient.  CC: Evalee Mutton, CNM

## 2019-01-18 NOTE — Telephone Encounter (Signed)
Spoke with patient, advised of results per [name] [name]. Patient agreeable to proceed with PUS. PUS scheduled for 01/19/19 at 8:30am, consult at 8:45am with [name] [name]. Patient verbalizes understanding and is agreeable.   Order previously placed.   Routing to [employer]  for [employer].   Encounter closed.

## 2019-01-19 ENCOUNTER — Ambulatory Visit (INDEPENDENT_AMBULATORY_CARE_PROVIDER_SITE_OTHER): Payer: No Typology Code available for payment source

## 2019-01-19 ENCOUNTER — Ambulatory Visit (INDEPENDENT_AMBULATORY_CARE_PROVIDER_SITE_OTHER): Payer: No Typology Code available for payment source | Admitting: Obstetrics and Gynecology

## 2019-01-19 ENCOUNTER — Encounter: Payer: Self-pay | Admitting: Obstetrics and Gynecology

## 2019-01-19 VITALS — BP 112/78 | HR 76 | Temp 97.5°F | Wt 148.4 lb

## 2019-01-19 DIAGNOSIS — Z3687 Encounter for antenatal screening for uncertain dates: Secondary | ICD-10-CM

## 2019-01-19 DIAGNOSIS — N912 Amenorrhea, unspecified: Secondary | ICD-10-CM | POA: Diagnosis not present

## 2019-01-19 DIAGNOSIS — Z3201 Encounter for pregnancy test, result positive: Secondary | ICD-10-CM | POA: Diagnosis not present

## 2019-01-19 DIAGNOSIS — Z3491 Encounter for supervision of normal pregnancy, unspecified, first trimester: Secondary | ICD-10-CM

## 2019-01-19 NOTE — Patient Instructions (Signed)
Common Medications Safe in Pregnancy  Acne:      Constipation:  Benzoyl Peroxide     Colace  Clindamycin      Dulcolax Suppository  Topica Erythromycin     Fibercon  Salicylic Acid      Metamucil         Miralax AVOID:        Senakot   Accutane    Cough:  Retin-A       Cough Drops  Tetracycline      Phenergan w/ Codeine if Rx  Minocycline      Robitussin (Plain & DM)  Antibiotics:     Crabs/Lice:  Ceclor       RID  Cephalosporins    AVOID:  E-Mycins      Kwell  Keflex  Macrobid/Macrodantin   Diarrhea:  Penicillin      Kao-Pectate  Zithromax      Imodium AD         PUSH FLUIDS AVOID:       Cipro     Fever:  Tetracycline      Tylenol (Regular or Extra  Minocycline       Strength)  Levaquin      Extra Strength-Do not          Exceed 8 tabs/24 hrs Caffeine:        <200mg/day (equiv. To 1 cup of coffee or  approx. 3 12 oz sodas)         Gas: Cold/Hayfever:       Gas-X  Benadryl      Mylicon  Claritin       Phazyme  **Claritin-D        Chlor-Trimeton    Headaches:  Dimetapp      ASA-Free Excedrin  Drixoral-Non-Drowsy     Cold Compress  Mucinex (Guaifenasin)     Tylenol (Regular or Extra  Sudafed/Sudafed-12 Hour     Strength)  **Sudafed PE Pseudoephedrine   Tylenol Cold & Sinus     Vicks Vapor Rub  Zyrtec  **AVOID if Problems With Blood Pressure         Heartburn: Avoid lying down for at least 1 hour after meals  Aciphex      Maalox     Rash:  Milk of Magnesia     Benadryl    Mylanta       1% Hydrocortisone Cream  Pepcid  Pepcid Complete   Sleep Aids:  Prevacid      Ambien   Prilosec       Benadryl  Rolaids       Chamomile Tea  Tums (Limit 4/day)     Unisom  Zantac       Tylenol PM         Warm milk-add vanilla or  Hemorrhoids:       Sugar for taste  Anusol/Anusol H.C.  (RX: Analapram 2.5%)  Sugar Substitutes:  Hydrocortisone OTC     Ok in moderation  Preparation H      Tucks        Vaseline lotion applied to tissue with  wiping    Herpes:     Throat:  Acyclovir      Oragel  Famvir  Valtrex     Vaccines:         Flu Shot Leg Cramps:       *Gardasil  Benadryl      Hepatitis A         Hepatitis B Nasal Spray:         Pneumovax  Saline Nasal Spray     Polio Booster         Tetanus Nausea:       Tuberculosis test or PPD  Vitamin B6 25 mg TID   AVOID:    Dramamine      *Gardasil  Emetrol       Live Poliovirus  Ginger Root 250 mg QID    MMR (measles, mumps &  High Complex Carbs @ Bedtime    rebella)  Sea Bands-Accupressure    Varicella (Chickenpox)  Unisom 1/2 tab TID     *No known complications           If received before Pain:         Known pregnancy;   Darvocet       Resume series after  Lortab        Delivery  Percocet    Yeast:   Tramadol      Femstat  Tylenol 3      Gyne-lotrimin  Ultram       Monistat  Vicodin           MISC:         All Sunscreens           Hair Coloring/highlights          Insect Repellant's          (Including DEET)         Mystic Tans Commonly Asked Questions During Pregnancy  Cats: A parasite can be excreted in cat feces.  To avoid exposure you need to have another person empty the little box.  If you must empty the litter box you will need to wear gloves.  Wash your hands after handling your cat.  This parasite can also be found in raw or undercooked meat so this should also be avoided.  Colds, Sore Throats, Flu: Please check your medication sheet to see what you can take for symptoms.  If your symptoms are unrelieved by these medications please call the office.  Dental Work: Most any dental work your dentist recommends is permitted.  X-rays should only be taken during the first trimester if absolutely necessary.  Your abdomen should be shielded with a lead apron during all x-rays.  Please notify your provider prior to receiving any x-rays.  Novocaine is fine; gas is not recommended.  If your dentist requires a note from us prior to dental work please call the office  and we will provide one for you.  Exercise: Exercise is an important part of staying healthy during your pregnancy.  You may continue most exercises you were accustomed to prior to pregnancy.  Later in your pregnancy you will most likely notice you have difficulty with activities requiring balance like riding a bicycle.  It is important that you listen to your body and avoid activities that put you at a higher risk of falling.  Adequate rest and staying well hydrated are a must!  If you have questions about the safety of specific activities ask your provider.    Exposure to Children with illness: Try to avoid obvious exposure; report any symptoms to us when noted,  If you have chicken pos, red measles or mumps, you should be immune to these diseases.   Please do not take any vaccines while pregnant unless you have checked with your OB provider.  Fetal Movement: After 28 weeks we recommend you do "kick counts" twice daily.  Lie or sit down in a calm   quiet environment and count your baby movements "kicks".  You should feel your baby at least 10 times per hour.  If you have not felt 10 kicks within the first hour get up, walk around and have something sweet to eat or drink then repeat for an additional hour.  If count remains less than 10 per hour notify your provider.  Fumigating: Follow your pest control agent's advice as to how long to stay out of your home.  Ventilate the area well before re-entering.  Hemorrhoids:   Most over-the-counter preparations can be used during pregnancy.  Check your medication to see what is safe to use.  It is important to use a stool softener or fiber in your diet and to drink lots of liquids.  If hemorrhoids seem to be getting worse please call the office.   Hot Tubs:  Hot tubs Jacuzzis and saunas are not recommended while pregnant.  These increase your internal body temperature and should be avoided.  Intercourse:  Sexual intercourse is safe during pregnancy as long as  you are comfortable, unless otherwise advised by your provider.  Spotting may occur after intercourse; report any bright red bleeding that is heavier than spotting.  Labor:  If you know that you are in labor, please go to the hospital.  If you are unsure, please call the office and let us help you decide what to do.  Lifting, straining, etc:  If your job requires heavy lifting or straining please check with your provider for any limitations.  Generally, you should not lift items heavier than that you can lift simply with your hands and arms (no back muscles)  Painting:  Paint fumes do not harm your pregnancy, but may make you ill and should be avoided if possible.  Latex or water based paints have less odor than oils.  Use adequate ventilation while painting.  Permanents & Hair Color:  Chemicals in hair dyes are not recommended as they cause increase hair dryness which can increase hair loss during pregnancy.  " Highlighting" and permanents are allowed.  Dye may be absorbed differently and permanents may not hold as well during pregnancy.  Sunbathing:  Use a sunscreen, as skin burns easily during pregnancy.  Drink plenty of fluids; avoid over heating.  Tanning Beds:  Because their possible side effects are still unknown, tanning beds are not recommended.  Ultrasound Scans:  Routine ultrasounds are performed at approximately 20 weeks.  You will be able to see your baby's general anatomy an if you would like to know the gender this can usually be determined as well.  If it is questionable when you conceived you may also receive an ultrasound early in your pregnancy for dating purposes.  Otherwise ultrasound exams are not routinely performed unless there is a medical necessity.  Although you can request a scan we ask that you pay for it when conducted because insurance does not cover " patient request" scans.  Work: If your pregnancy proceeds without complications you may work until your due date,  unless your physician or employer advises otherwise.  Round Ligament Pain/Pelvic Discomfort:  Sharp, shooting pains not associated with bleeding are fairly common, usually occurring in the second trimester of pregnancy.  They tend to be worse when standing up or when you remain standing for long periods of time.  These are the result of pressure of certain pelvic ligaments called "round ligaments".  Rest, Tylenol and heat seem to be the most effective relief.  As the  womb and fetus grow, they rise out of the pelvis and the discomfort improves.  Please notify the office if your pain seems different than that described.  It may represent a more serious condition.

## 2019-02-25 ENCOUNTER — Encounter (HOSPITAL_COMMUNITY): Payer: Self-pay | Admitting: Obstetrics & Gynecology

## 2019-02-25 ENCOUNTER — Inpatient Hospital Stay (HOSPITAL_COMMUNITY)
Admission: AD | Admit: 2019-02-25 | Discharge: 2019-02-26 | Disposition: A | Discharge status: Home / Self Care | Payer: No Typology Code available for payment source | Attending: Obstetrics & Gynecology | Admitting: Obstetrics & Gynecology

## 2019-02-25 ENCOUNTER — Inpatient Hospital Stay (HOSPITAL_COMMUNITY): Payer: No Typology Code available for payment source

## 2019-02-25 ENCOUNTER — Other Ambulatory Visit: Payer: Self-pay

## 2019-02-25 DIAGNOSIS — O034 Incomplete spontaneous abortion without complication: Secondary | ICD-10-CM | POA: Diagnosis not present

## 2019-02-25 DIAGNOSIS — O209 Hemorrhage in early pregnancy, unspecified: Secondary | ICD-10-CM

## 2019-02-25 DIAGNOSIS — Z3A11 11 weeks gestation of pregnancy: Secondary | ICD-10-CM

## 2019-02-25 LAB — WET PREP, GENITAL
Clue Cells Wet Prep HPF POC: NONE SEEN
Sperm: NONE SEEN
Trich, Wet Prep: NONE SEEN
Yeast Wet Prep HPF POC: NONE SEEN

## 2019-02-25 LAB — URINALYSIS, ROUTINE W REFLEX MICROSCOPIC
Bilirubin Urine: NEGATIVE
Glucose, UA: NEGATIVE mg/dL
Ketones, ur: NEGATIVE mg/dL
Leukocytes,Ua: NEGATIVE
Nitrite: NEGATIVE
Protein, ur: 100 mg/dL — AB
RBC / HPF: >50 RBC/hpf — ABNORMAL HIGH (ref 0–5)
Specific Gravity, Urine: 1.027 (ref 1.005–1.030)
pH: 5 (ref 5.0–8.0)

## 2019-02-25 NOTE — MAU Provider Note (Addendum)
History     CSN: [id number]  Arrival date and time: 02/25/19 2246   First Provider Initiated Contact with Patient 02/25/19 2310      Chief Complaint  Patient presents with  . Vaginal Bleeding   HPI  Ms.  Lori Rice is a 33 y.o. year old G30P0000 female at [redacted]w[redacted]d weeks gestation who presents to MAU reporting having brownish vaginal discharge for the last few days. She reports while she was out tonight at a singing gig she started having red vaginal bleeding. She does not have on a pad at the time of arriving to MAU. She expresses concern and she didn't feel she could wait until Monday. She declines abdominal pain. Her last SI was 5-6 days ago; per her and her spouse, Lori Rice. She has been followed up to this point by [name] [name], but has a RN appointment scheduled at [facility] on 03/06/2019; OB provider on 03/16/2019. Her pregnancy has been complicated with abnormally rising HCG levels. She had an ultrasound at Dr. Gentry Fitz office on 01/19/2019 showing a viable IUP @ 5.[redacted] wks gestation.  Past Medical History:  Diagnosis Date  . Anxiety   . Bradycardia   . Depression   . Migraines   . Mononucleosis   . Palpitations     Past Surgical History:  Procedure Laterality Date  . LAPAROSCOPIC APPENDECTOMY N/A 07/21/2012   Procedure: APPENDECTOMY LAPAROSCOPIC;  Surgeon: Haywood Lasso, MD;  Location: [facility];  Service: General;  Laterality: N/A;  . TONSILLECTOMY    . TONSILLECTOMY      Family History  Problem Relation Age of Onset  . Breast cancer Mother   . Lung cancer Paternal Grandfather   . Heart disease Paternal Grandfather   . Hypertension Maternal Grandmother   . Bladder Cancer Maternal Grandfather   . Hypertension Maternal Grandfather   . Stroke Maternal Grandfather   . Heart disease Maternal Grandfather   . Heart failure Maternal Grandfather   . Breast cancer Paternal Grandmother     Social History   Tobacco Use  . Smoking status: Never Smoker  . Smokeless  tobacco: Never Used  Substance Use Topics  . Alcohol use: Not Currently  . Drug use: No    Allergies: No Known Allergies  No medications prior to admission.    Review of Systems  Constitutional: Negative.   HENT: Negative.   Eyes: Negative.   Respiratory: Negative.   Cardiovascular: Negative.   Gastrointestinal: Negative.   Endocrine: Negative.   Genitourinary: Positive for vaginal bleeding (Brownish d/c for the past few days, red bleeding tonight, but not wearing pad). Negative for pelvic pain.  Musculoskeletal: Negative.   Skin: Negative.   Allergic/Immunologic: Negative.   Neurological: Negative.   Hematological: Negative.   Psychiatric/Behavioral: Negative.    Physical Exam   Blood pressure 125/77, pulse 94, temperature 97.9 F (36.6 C), temperature source Oral, resp. rate 17, last menstrual period 12/01/2018.  Physical Exam  Nursing note and vitals reviewed. Constitutional: She is oriented to person, place, and time. She appears well-developed and well-nourished.  HENT:  Head: Normocephalic and atraumatic.  Eyes: Pupils are equal, round, and reactive to light.  Cardiovascular: Normal rate.  Respiratory: Effort normal.  GI: Soft.  Genitourinary:    Genitourinary Comments: Uterus: non-tender, S<D, SE: cervix is smooth, pink, no lesions, moderate amt of dark, red blood in vaginal vault -- WP, GC/CT done, closed/long/firm, no CMT or friability, no adnexal tenderness    Musculoskeletal:  General: Normal range of motion.     Cervical back: Normal range of motion.  Neurological: She is alert and oriented to person, place, and time.  Skin: Skin is warm and dry.  Psychiatric: She has a normal mood and affect. Her behavior is normal. Judgment and thought content normal.   Unable to obtain FHTs with doppler MAU Course  Procedures  MDM CCUA UPT CBC ABO/Rh HCG Wet Prep GC/CT -- pending HIV -- pending OB < 14 wks [place] with TV Rhophylac work-up Rhophylac 300  mcg IM injection  Results for orders placed or performed during the hospital encounter of 02/25/19 (from the past 24 hour(s))  Urinalysis, Routine w reflex microscopic     Status: Abnormal   Collection Time: 02/25/19 11:03 PM  Result Value Ref Range   Color, Urine AMBER (A) YELLOW   APPearance CLOUDY (A) CLEAR   Specific Gravity, Urine 1.027 1.005 - 1.030   pH 5.0 5.0 - 8.0   Glucose, UA NEGATIVE NEGATIVE mg/dL   Hgb urine dipstick LARGE (A) NEGATIVE   Bilirubin Urine NEGATIVE NEGATIVE   Ketones, ur NEGATIVE NEGATIVE mg/dL   Protein, ur 102 (A) NEGATIVE mg/dL   Nitrite NEGATIVE NEGATIVE   Leukocytes,Ua NEGATIVE NEGATIVE   RBC / HPF >50 (H) 0 - 5 RBC/hpf   WBC, UA 11-20 0 - 5 WBC/hpf   Bacteria, UA FEW (A) NONE SEEN   Squamous Epithelial / LPF 0-5 0 - 5   Mucus PRESENT   Wet prep, genital     Status: Abnormal   Collection Time: 02/25/19 11:30 PM   Specimen: Vaginal  Result Value Ref Range   Yeast Wet Prep HPF POC NONE SEEN NONE SEEN   Trich, Wet Prep NONE SEEN NONE SEEN   Clue Cells Wet Prep HPF POC NONE SEEN NONE SEEN   WBC, Wet Prep HPF POC MANY (A) NONE SEEN   Sperm NONE SEEN   CBC     Status: None   Collection Time: 02/25/19 11:44 PM  Result Value Ref Range   WBC 7.4 4.0 - 10.5 K/uL   RBC 4.42 3.87 - 5.11 MIL/uL   Hemoglobin 13.1 12.0 - 15.0 g/dL   HCT 58.5 27.7 - 82.4 %   MCV 86.9 80.0 - 100.0 fL   MCH 29.6 26.0 - 34.0 pg   MCHC 34.1 30.0 - 36.0 g/dL   RDW 23.5 36.1 - 44.3 %   Platelets 183 150 - 400 K/uL   nRBC 0.0 0.0 - 0.2 %  ABO/Rh     Status: None   Collection Time: 02/25/19 11:44 PM  Result Value Ref Range   ABO/RH(D)      A NEG Performed at [facility], [address], [place], [place] [address]   hCG, quantitative, pregnancy     Status: Abnormal   Collection Time: 02/25/19 11:44 PM  Result Value Ref Range   hCG, Beta Chain, Quant, S 7,449 (H) <5 mIU/mL  Rho w/u     Status: None (Preliminary result)   Collection Time: 02/26/19 12:22 AM   Result Value Ref Range   Gestational Age(Wks) 11.1    ABO/RH(D) A NEG    Antibody Screen NEG    Unit Number [id number]    Blood Component Type RHIG    Unit division 00    Status of Unit ISSUED    Transfusion Status      OK TO TRANSFUSE Performed at [facility], [address], [place], [place] [address]  US OB Comp Less 14 Wks  Result Date: 02/26/2019 CLINICAL DATA:  Eleven weeks pregnant, vaginal bleeding EXAM: OBSTETRIC <14 WK ULTRASOUND TECHNIQUE: Transabdominal ultrasound was performed for evaluation of the gestation as well as the maternal uterus and adnexal regions. COMPARISON:  CT abdomen pelvis Jul 21, 2012 FINDINGS: Intrauterine gestational sac: Single Yolk sac:  Not Visualized. Embryo:  Visualized. Cardiac Activity: Not Visualized. CRL: 20.7 mm   8 w 4 d                  US EDC: 10/03/2019 Subchorionic hemorrhage: Small amount of subchorionic hemorrhage subjacent to the implantation site. Maternal uterus/adnexae: Normal appearance of the ovaries. No free fluid in the pelvis. IMPRESSION: Findings meet definitive criteria for nonviable pregnancy. This follows SRU consensus guidelines: Diagnostic Criteria for Nonviable Pregnancy Early in the First Trimester. Macy Mis Engl J Med [phone];369:1443-51. Electronically Signed   By: Kreg ShropshirePrice  DeHay M.D.   On: 02/26/2019 00:16     Assessment and Plan  Incomplete miscarriage  - Offered options of expectant management or Cytotec management  patient and spouse chose expectant management - Information provided on incomplete miscarriage and managing pregnancy loss - Comfort measures offered and given   Vaginal bleeding affecting early pregnancy  - Return to MAU:  If you have heavier bleeding that soaks through more that 2 pads per hour for an hour or more  If you bleed so much that you feel like you might pass out or you do pass out  If you have significant abdominal pain that is not improved with Tylenol or Ibuprofen  If you develop  a fever > 100.5   - Discharge patient - Advised to call Dr. Salli QuarryJertson's office to schedule follow-up appointments for MAB  note faxed to Dr. Salli QuarryJertson's office - Patient verbalized an understanding of the plan of care and agrees.    Raelyn Moraolitta Chrystal Zeimet, MSN, CNM 02/25/2019, 11:10 PM

## 2019-02-25 NOTE — MAU Note (Signed)
Patient reports to MAU c/o a brownish discharge for the last few days and tonight she had started red bleeding pt not having to wear pads. Pt is just concerned and couldn't wait till Monday. No abdominal pain. No other complaints at this time.

## 2019-02-26 ENCOUNTER — Encounter (HOSPITAL_COMMUNITY): Payer: Self-pay | Admitting: Obstetrics & Gynecology

## 2019-02-26 DIAGNOSIS — O034 Incomplete spontaneous abortion without complication: Secondary | ICD-10-CM | POA: Diagnosis present

## 2019-02-26 DIAGNOSIS — O209 Hemorrhage in early pregnancy, unspecified: Secondary | ICD-10-CM | POA: Diagnosis present

## 2019-02-26 LAB — CBC
HCT: 38.4 % (ref 36.0–46.0)
Hemoglobin: 13.1 g/dL (ref 12.0–15.0)
MCH: 29.6 pg (ref 26.0–34.0)
MCHC: 34.1 g/dL (ref 30.0–36.0)
MCV: 86.9 fL (ref 80.0–100.0)
Platelets: 183 10*3/uL (ref 150–400)
RBC: 4.42 MIL/uL (ref 3.87–5.11)
RDW: 11.8 % (ref 11.5–15.5)
WBC: 7.4 10*3/uL (ref 4.0–10.5)
nRBC: 0 % (ref 0.0–0.2)

## 2019-02-26 LAB — ABO/RH: ABO/RH(D): A NEG

## 2019-02-26 LAB — HCG, QUANTITATIVE, PREGNANCY: hCG, Beta Chain, Quant, S: 7449 m[IU]/mL — ABNORMAL HIGH (ref <5)

## 2019-02-26 LAB — HIV ANTIBODY (ROUTINE TESTING W REFLEX): HIV Screen 4th Generation wRfx: NONREACTIVE

## 2019-02-26 MED ORDER — RHO D IMMUNE GLOBULIN 1500 UNIT/2ML IJ SOSY
300.0000 ug | PREFILLED_SYRINGE | Freq: Once | INTRAMUSCULAR | Status: AC
  Administered 2019-02-26: 300 ug via INTRAMUSCULAR
  Filled 2019-02-26: qty 2

## 2019-02-26 NOTE — Discharge Instructions (Signed)
Return to MAU: °· If you have heavier bleeding that soaks through more that 2 pads per hour for an hour or more °· If you bleed so much that you feel like you might pass out or you do pass out °· If you have significant abdominal pain that is not improved with Tylenol  °· If you develop a fever > 100.5 °  °

## 2019-02-27 LAB — GC/CHLAMYDIA PROBE AMP (~~LOC~~) NOT AT ARMC
Chlamydia: NEGATIVE
Comment: NEGATIVE
Comment: NORMAL
Neisseria Gonorrhea: NEGATIVE

## 2019-02-27 LAB — RH IG WORKUP (INCLUDES ABO/RH)
ABO/RH(D): A NEG
Antibody Screen: NEGATIVE
Gestational Age(Wks): 11.1
Unit division: 0

## 2019-03-01 ENCOUNTER — Telehealth: Payer: Self-pay | Admitting: Obstetrics and Gynecology

## 2019-03-01 NOTE — Telephone Encounter (Signed)
Called patient to check on her. She is hanging in, sad as expected.

## 2019-03-01 NOTE — Telephone Encounter (Signed)
Spoke to pt. Pt states had miscarriage this past weekend on 02/25/19 after going to ER for bright red bleeding. Pt states had spotting on 02/21/19, spoke to [facility] office at [facility] and was given advice what to look for with bleeding. Pt states had heavy bleeding with large clots that started on 12/27 after coming home from ER  for a couple of days with severe cramps. Pt now having light bleeding with 1 small clot today. Wants to know what to do now for appt with Dr Talbert [name].   Will speak to Dr Talbert [name] and return call to pt. Pt agreeable.

## 2019-03-01 NOTE — Telephone Encounter (Signed)
Patient need follow up with [name] [name] after miscarriage over the weekend.

## 2019-03-02 ENCOUNTER — Other Ambulatory Visit: Payer: Self-pay

## 2019-03-06 ENCOUNTER — Other Ambulatory Visit: Payer: Self-pay

## 2019-03-06 ENCOUNTER — Ambulatory Visit (INDEPENDENT_AMBULATORY_CARE_PROVIDER_SITE_OTHER): Payer: BC Managed Care – PPO | Admitting: Obstetrics and Gynecology

## 2019-03-06 ENCOUNTER — Encounter: Payer: Self-pay | Admitting: Obstetrics and Gynecology

## 2019-03-06 VITALS — BP 118/64 | HR 66 | Temp 98.4°F | Ht 66.0 in | Wt 153.5 lb

## 2019-03-06 DIAGNOSIS — O039 Complete or unspecified spontaneous abortion without complication: Secondary | ICD-10-CM

## 2019-03-06 NOTE — Progress Notes (Signed)
GYNECOLOGY  VISIT   HPI: 34 y.o.   Married White or Caucasian Not Hispanic or Latino  female   G1P0000 with Patient's last menstrual period was 12/01/2018 (exact date).   here for   Follow up from Er Visit on 02/25/19 for Incomplete miscarriage. She was seen with vaginal bleeding, ultrasound showed a non viable 8+4 week IUP, a small subchorionic hemorrhage was noted. She was offered different treatment options and elected for expectant management. She started bleeding heavily on 12/27 with severe cramps. The severe cramps came in waves for 2-3 hours intermittently. She was passing blood and clots in the toilet, she brought some in (didn't get all of it). It was about the heaviness of a normal period except for the clots and pain. She was passing clots/tissue for up to 24 hours. Not having any more pain. No fevers.  She states that her bleeding has pretty much stopped as of today.   GYNECOLOGIC HISTORY: Patient's last menstrual period was 12/01/2018 (exact date). Contraception:none  Menopausal hormone therapy: none         OB History    Gravida  1   Para  0   Term  0   Preterm  0   AB  0   Living  0     SAB  0   TAB  0   Ectopic  0   Multiple  0   Live Births  0              Patient Active Problem List   Diagnosis Date Noted  . Incomplete miscarriage 02/26/2019  . Vaginal bleeding affecting early pregnancy 02/26/2019  . Encounter for procreative genetic counseling 11/08/2018  . Situational anxiety 11/08/2015  . Palpitations 06/19/2013  . Dyspnea 06/19/2013  . Retrocecal appendicitis 07/21/2012    Past Medical History:  Diagnosis Date  . Anxiety   . Bradycardia   . Depression   . Migraines   . Mononucleosis   . Palpitations     Past Surgical History:  Procedure Laterality Date  . LAPAROSCOPIC APPENDECTOMY N/A 07/21/2012   Procedure: APPENDECTOMY LAPAROSCOPIC;  Surgeon: Currie Paris, MD;  Location: [facility];  Service: General;  Laterality: N/A;  .  TONSILLECTOMY    . TONSILLECTOMY      Current Outpatient Medications  Medication Sig Dispense Refill  . Prenatal Vit-Fe Fumarate-FA (PRENATAL VITAMIN PO) Take by mouth.     No current facility-administered medications for this visit.     ALLERGIES: Patient has no known allergies.  Family History  Problem Relation Age of Onset  . Breast cancer Mother   . Lung cancer Paternal Grandfather   . Heart disease Paternal Grandfather   . Hypertension Maternal Grandmother   . Bladder Cancer Maternal Grandfather   . Hypertension Maternal Grandfather   . Stroke Maternal Grandfather   . Heart disease Maternal Grandfather   . Heart failure Maternal Grandfather   . Breast cancer Paternal Grandmother     Social History   Socioeconomic History  . Marital status: Married    Spouse name: Not on file  . Number of children: 0  . Years of education: Not on file  . Highest education level: Not on file  Occupational History  . Occupation: Environmental health practitioner: [employer]  Tobacco Use  . Smoking status: Never Smoker  . Smokeless tobacco: Never Used  Substance and Sexual Activity  . Alcohol use: Not Currently  . Drug use: No  . Sexual activity:  Yes    Partners: Male    Birth control/protection: None  Other Topics Concern  . Not on file  Social History Narrative  . Not on file   Social Determinants of Health   Financial Resource Strain:   . Difficulty of Paying Living Expenses: Not on file  Food Insecurity:   . Worried About Charity fundraiser in the Last Year: Not on file  . Ran Out of Food in the Last Year: Not on file  Transportation Needs:   . Lack of Transportation (Medical): Not on file  . Lack of Transportation (Non-Medical): Not on file  Physical Activity:   . Days of Exercise per Week: Not on file  . Minutes of Exercise per Session: Not on file  Stress:   . Feeling of Stress : Not on file  Social Connections:   . Frequency of Communication with  Friends and Family: Not on file  . Frequency of Social Gatherings with Friends and Family: Not on file  . Attends Religious Services: Not on file  . Active Member of Clubs or Organizations: Not on file  . Attends Archivist Meetings: Not on file  . Marital Status: Not on file  Intimate Partner Violence:   . Fear of Current or Ex-Partner: Not on file  . Emotionally Abused: Not on file  . Physically Abused: Not on file  . Sexually Abused: Not on file    Review of Systems  All other systems reviewed and are negative.   PHYSICAL EXAMINATION:    BP 118/64   Pulse 66   Temp 98.4 F (36.9 C)   Ht 5\' 6"  (1.676 m)   Wt 153 lb 8 oz (69.6 kg)   LMP 12/01/2018 (Exact Date)   SpO2 96%   BMI 24.78 kg/m     General appearance: alert, cooperative and appears stated age Abdomen: soft, non-tender; non distended, no masses,  no organomegaly  Pelvic: External genitalia:  no lesions              Urethra:  normal appearing urethra with no masses, tenderness or lesions              Bartholins and Skenes: normal                 Vagina: normal appearing vagina with normal color and discharge, no lesions              Cervix: no cervical motion tenderness, no lesions and closed              Bimanual Exam:  Uterus:  normal size, contour, position, consistency, mobility, non-tender and anteverted              Adnexa: no mass, fullness, tenderness                Chaperone was present for exam.  Reviewed notes from her hospital visit, reviewed lab work and independently reviewed the ultrasound images. A negative, s/p rhogam. Bhcg 7,449  ASSESSMENT S/P SAB, the material she brought in was c/w clot (not sent to the lab). Her bleeding has essentially stopped, uterus feels normal sized.   RH negative, s/p rhogam  PLAN Discussed SAB, frequency, discussed high chance it occurred secondary to chromosomal abnormalities.  Discussed the option of checking her BhcG, she declines Will call if she  doesn't have a spontaneous cycle in the next month Call with any concerns Recommended she wait until her next cycle to try and get  pregnant PNV   An After Visit Summary was printed and given to the patient.

## 2019-03-20 DIAGNOSIS — G43719 Chronic migraine without aura, intractable, without status migrainosus: Secondary | ICD-10-CM | POA: Diagnosis not present

## 2019-04-18 ENCOUNTER — Telehealth: Payer: Self-pay | Admitting: Certified Nurse Midwife

## 2019-04-18 NOTE — Telephone Encounter (Signed)
Left message for pt to call and speak with Aliece Honold, RN 

## 2019-04-18 NOTE — Telephone Encounter (Signed)
Patient returned a call to Stephanie. 

## 2019-04-18 NOTE — Telephone Encounter (Signed)
Patient would like to come in for pregnancy confirmation. 

## 2019-04-18 NOTE — Telephone Encounter (Signed)
Spoke to pt. Pt states "getting pregnant after miscarriage in Dec". Pt states having pregnancy sx of heightened smell and breast soreness. Pt thinks she is 4.[redacted] weeks along.  Pt wanting to come in for pregnancy confirmation. Pt scheduled to see D. Darcel Bayley, CNM on 04/21/2019 at 2:30 pm. Pt agreeable to date and time.   Routing to D. Darcel Bayley, CNM for review and will close encounter.

## 2019-04-21 ENCOUNTER — Ambulatory Visit (INDEPENDENT_AMBULATORY_CARE_PROVIDER_SITE_OTHER): Payer: BC Managed Care – PPO | Admitting: Certified Nurse Midwife

## 2019-04-21 ENCOUNTER — Other Ambulatory Visit: Payer: Self-pay

## 2019-04-21 ENCOUNTER — Telehealth: Payer: Self-pay | Admitting: *Deleted

## 2019-04-21 ENCOUNTER — Encounter: Payer: Self-pay | Admitting: Certified Nurse Midwife

## 2019-04-21 VITALS — BP 110/64 | HR 68 | Temp 98.4°F | Resp 16 | Wt 156.0 lb

## 2019-04-21 DIAGNOSIS — Z3201 Encounter for pregnancy test, result positive: Secondary | ICD-10-CM

## 2019-04-21 DIAGNOSIS — N912 Amenorrhea, unspecified: Secondary | ICD-10-CM

## 2019-04-21 DIAGNOSIS — O09291 Supervision of pregnancy with other poor reproductive or obstetric history, first trimester: Secondary | ICD-10-CM

## 2019-04-21 LAB — POCT URINE PREGNANCY: Preg Test, Ur: POSITIVE — AB

## 2019-04-21 NOTE — Telephone Encounter (Signed)
Spoke with patient. PUS scheduled for 2/25 at 4pm, consult to follow at 4:30pm with Dr. Oscar La. Order placed for precert. Patient verbalizes understanding and is agreeable.   Routing to provider for final review. Patient is agreeable to disposition. Will close encounter.  Cc: Soundra Pilon, [place] Card[place], Dr. Oscar La

## 2019-04-21 NOTE — Telephone Encounter (Signed)
-----   Message from Verner Chol, CNM sent at 04/21/2019  3:47 PM EST ----- Patient had SAB last year and is early pregnant with strong positive UPT. She needs PUS early for confirmation. Will be 5 +6 on 04/27/2019 She saw Dr Oscar La before and would like to again.Aware she will be called with information.

## 2019-04-21 NOTE — Progress Notes (Signed)
34 y.o.married female [id number] presents with amenorrhea with + UPT on 04/11/2019.No period since last SAB at 11 wk 3 days and went to [facility] with bleeding. Planned pregnancy, happy. Sister also pregnant. Complaining of breast tenderness, slight fatigue,no nausea. Trying to eat well. Denies spotting, bleeding or cramping. Medications she is taking are: Prenatal vitamins only. Patient " of course is anxious". Feels different than last pregnancy. Positive UPT right away. Patient has consumed alcohol since + UPT. Very small amount prior to positive UPT. Spouse very supportive.    Review of Systems  Constitutional: Positive for malaise/fatigue.       Some fatigue  HENT: Negative.   Eyes: Negative.   Respiratory: Negative.   Cardiovascular: Negative.   Gastrointestinal: Negative.   Genitourinary:       Amenorrhea with + UPT  Musculoskeletal: Negative.   Skin: Negative.   Neurological: Negative.   Endo/Heme/Allergies: Negative.   Psychiatric/Behavioral: Negative.     O: HPI pertinent to above. Healthy [facility] female Affect: normal, orientation x 3  Strong + UPT today  Last Aex:12/06/2018 Pap smear: 12/06/2018 negative             A: Amenorrhea with positive UPT  5 wk 0 days ovulation predictor and increase in discharge note which appeared to be ovulation. No period since last SAB. EDC per ovulation 12/21/19 Planned pregnancy History of SAB 03/06/2019   P: Reviewed with patient importance of prenatal care during pregnancy. Plans to use [facility]. Reviewed nutrition importance of pregnancy and selecting from all food groups and making sure to have adequate protein intake daily. Discussed avoiding raw or exotic fish, soft cheeses due to risk of bacteria . Discussed concerns with FAS with alcohol use in pregnancy. Discussed increase of IUGR and SIDS with smoking use or second smoke. Reviewed warning signs of early pregnancy and need to advise if occurs.Recommend early PUS to establish IUP and  viability. Patient agreeable. Discussed comfort measures for early pregnancy changes.   She will be called with insurance information and scheduled. Questions addressed at length.    20 minutes in time spent with patient in face to face counseling regarding pregnancy and prenatal care

## 2019-04-21 NOTE — Patient Instructions (Signed)

## 2019-04-27 ENCOUNTER — Ambulatory Visit (INDEPENDENT_AMBULATORY_CARE_PROVIDER_SITE_OTHER): Payer: BC Managed Care – PPO

## 2019-04-27 ENCOUNTER — Ambulatory Visit (INDEPENDENT_AMBULATORY_CARE_PROVIDER_SITE_OTHER): Payer: BC Managed Care – PPO | Admitting: Obstetrics and Gynecology

## 2019-04-27 ENCOUNTER — Encounter: Payer: Self-pay | Admitting: Obstetrics and Gynecology

## 2019-04-27 ENCOUNTER — Other Ambulatory Visit: Payer: Self-pay

## 2019-04-27 VITALS — BP 110/60 | HR 61 | Temp 98.1°F | Ht 66.0 in | Wt 155.8 lb

## 2019-04-27 DIAGNOSIS — Z3491 Encounter for supervision of normal pregnancy, unspecified, first trimester: Secondary | ICD-10-CM

## 2019-04-27 DIAGNOSIS — O219 Vomiting of pregnancy, unspecified: Secondary | ICD-10-CM | POA: Diagnosis not present

## 2019-04-27 DIAGNOSIS — Z8759 Personal history of other complications of pregnancy, childbirth and the puerperium: Secondary | ICD-10-CM | POA: Diagnosis not present

## 2019-04-27 DIAGNOSIS — O09291 Supervision of pregnancy with other poor reproductive or obstetric history, first trimester: Secondary | ICD-10-CM

## 2019-04-27 DIAGNOSIS — Z3201 Encounter for pregnancy test, result positive: Secondary | ICD-10-CM

## 2019-04-27 DIAGNOSIS — N912 Amenorrhea, unspecified: Secondary | ICD-10-CM

## 2019-04-27 NOTE — Patient Instructions (Signed)

## 2019-04-27 NOTE — Progress Notes (Signed)
GYNECOLOGY  VISIT   HPI: 34 y.o.   Married White or Caucasian Not Hispanic or Latino  female   G2P0010 with No LMP recorded. Patient is pregnant.   here for   Viability scan. She states that she is feeling fatigue and her breast are sore. She says that the last two days she has had some nausea, no emesis. No bleeding.   GYNECOLOGIC HISTORY: No LMP recorded. Patient is pregnant. Contraception:none  Menopausal hormone therapy: none         OB History    Gravida  2   Para  0   Term  0   Preterm  0   AB  1   Living  0     SAB  1   TAB  0   Ectopic  0   Multiple  0   Live Births  0              Patient Active Problem List   Diagnosis Date Noted  . Incomplete miscarriage 02/26/2019  . Vaginal bleeding affecting early pregnancy 02/26/2019  . Encounter for procreative genetic counseling 11/08/2018  . Situational anxiety 11/08/2015  . Palpitations 06/19/2013  . Dyspnea 06/19/2013  . Retrocecal appendicitis 07/21/2012    Past Medical History:  Diagnosis Date  . Anxiety   . Bradycardia   . Depression   . Migraines   . Mononucleosis   . Palpitations     Past Surgical History:  Procedure Laterality Date  . LAPAROSCOPIC APPENDECTOMY N/A 07/21/2012   Procedure: APPENDECTOMY LAPAROSCOPIC;  Surgeon: Currie Paris, MD;  Location: [facility];  Service: General;  Laterality: N/A;  . TONSILLECTOMY    . TONSILLECTOMY      Current Outpatient Medications  Medication Sig Dispense Refill  . Prenatal Vit-Fe Fumarate-FA (PRENATAL VITAMIN PO) Take by mouth.     No current facility-administered medications for this visit.     ALLERGIES: Patient has no known allergies.  Family History  Problem Relation Age of Onset  . Breast cancer Mother   . Lung cancer Paternal Grandfather   . Heart disease Paternal Grandfather   . Hypertension Maternal Grandmother   . Bladder Cancer Maternal Grandfather   . Hypertension Maternal Grandfather   . Stroke Maternal Grandfather     . Heart disease Maternal Grandfather   . Heart failure Maternal Grandfather   . Breast cancer Paternal Grandmother     Social History   Socioeconomic History  . Marital status: Married    Spouse name: Not on file  . Number of children: 0  . Years of education: Not on file  . Highest education level: Not on file  Occupational History  . Occupation: Environmental health practitioner: [employer]  Tobacco Use  . Smoking status: Never Smoker  . Smokeless tobacco: Never Used  Substance and Sexual Activity  . Alcohol use: Not Currently  . Drug use: No  . Sexual activity: Yes    Partners: Male    Birth control/protection: None  Other Topics Concern  . Not on file  Social History Narrative  . Not on file   Social Determinants of Health   Financial Resource Strain:   . Difficulty of Paying Living Expenses: Not on file  Food Insecurity:   . Worried About Programme researcher, broadcasting/film/video in the Last Year: Not on file  . Ran Out of Food in the Last Year: Not on file  Transportation Needs:   . Lack of Transportation (Medical): Not  on file  . Lack of Transportation (Non-Medical): Not on file  Physical Activity:   . Days of Exercise per Week: Not on file  . Minutes of Exercise per Session: Not on file  Stress:   . Feeling of Stress : Not on file  Social Connections:   . Frequency of Communication with Friends and Family: Not on file  . Frequency of Social Gatherings with Friends and Family: Not on file  . Attends Religious Services: Not on file  . Active Member of Clubs or Organizations: Not on file  . Attends Archivist Meetings: Not on file  . Marital Status: Not on file  Intimate Partner Violence:   . Fear of Current or Ex-Partner: Not on file  . Emotionally Abused: Not on file  . Physically Abused: Not on file  . Sexually Abused: Not on file    Review of Systems  All other systems reviewed and are negative.   PHYSICAL EXAMINATION:    BP 110/60   Pulse 61    Temp 98.1 F (36.7 C)   Ht 5\' 6"  (1.676 m)   Wt 155 lb 12.8 oz (70.7 kg)   SpO2 93%   BMI 25.15 kg/m     General appearance: alert, cooperative and appears stated age  Ultrasound images reviewed with the patient. Viable IUP c/w 5+6 weeks.  ASSESSMENT Viable IUP c/w ovulation date Nausea H/O SAB. Discussed risks of SAB. Discussed reassuring ultrasound.    PLAN She will establish care with OB Discussed nausea in pregnancy, can take unisom and vit B6. Discussed small meals, bland snacks   An After Visit Summary was printed and given to the patient.  In addition to reviewing the ultrasound, ~15 minutes was spent in patient care.

## 2019-04-28 ENCOUNTER — Telehealth: Payer: Self-pay | Admitting: Obstetrics and Gynecology

## 2019-04-28 DIAGNOSIS — O209 Hemorrhage in early pregnancy, unspecified: Secondary | ICD-10-CM

## 2019-04-28 NOTE — Telephone Encounter (Signed)
Patient had ultrasound yesterday and is bleeding.

## 2019-04-28 NOTE — Telephone Encounter (Signed)
After speaking to Dr Oscar La. Verbal orders given for pt to monitor bleeding for now since had [place] yesterday and precautions for bleeding over weekend. Pt is A Neg. If pt has heavy bleeding and goes to [facility],then pt to get another Rhogam injection. Pt to have repeat PUS in office if does not go to hospital this weekend.   Pt called and made aware of above recommendations per Dr Oscar La. Pt agreeable and thankful for advice. Repeat PUS scheduled for 05/02/2019. Pt verbalized understanding.   Routing to Dr Oscar La and will close encounter. Orders placed for repeat PUS. Cc: Clotilde Dieter

## 2019-04-28 NOTE — Telephone Encounter (Signed)
Spoke to pt. Pt states having bleeding when stood up that occurred about 45 mins ago. Pt states then placed pad. Pt did describe having sharp pain over ovary for an hour before bleeding. Bleeding described as 1-2 tbs that was red. Nothing on pad as of now. Yvonna Alanis , RN to call Dr Oscar La with recommendations now. Will return call to pt. Pt agreeable.

## 2019-05-02 ENCOUNTER — Encounter: Payer: Self-pay | Admitting: Obstetrics and Gynecology

## 2019-05-02 ENCOUNTER — Other Ambulatory Visit: Payer: Self-pay

## 2019-05-02 ENCOUNTER — Ambulatory Visit (INDEPENDENT_AMBULATORY_CARE_PROVIDER_SITE_OTHER): Payer: BC Managed Care – PPO

## 2019-05-02 ENCOUNTER — Ambulatory Visit (INDEPENDENT_AMBULATORY_CARE_PROVIDER_SITE_OTHER): Payer: BC Managed Care – PPO | Admitting: Obstetrics and Gynecology

## 2019-05-02 VITALS — BP 122/68 | HR 78 | Temp 99.7°F | Ht 66.0 in | Wt 155.0 lb

## 2019-05-02 DIAGNOSIS — Z3687 Encounter for antenatal screening for uncertain dates: Secondary | ICD-10-CM

## 2019-05-02 DIAGNOSIS — O468X1 Other antepartum hemorrhage, first trimester: Secondary | ICD-10-CM

## 2019-05-02 DIAGNOSIS — Z3491 Encounter for supervision of normal pregnancy, unspecified, first trimester: Secondary | ICD-10-CM

## 2019-05-02 DIAGNOSIS — O209 Hemorrhage in early pregnancy, unspecified: Secondary | ICD-10-CM | POA: Diagnosis not present

## 2019-05-02 DIAGNOSIS — O418X10 Other specified disorders of amniotic fluid and membranes, first trimester, not applicable or unspecified: Secondary | ICD-10-CM

## 2019-05-02 NOTE — Patient Instructions (Signed)

## 2019-05-02 NOTE — Progress Notes (Signed)
GYNECOLOGY  VISIT   HPI: 34 y.o.   Married White or Caucasian Not Hispanic or Latino  female   G2P0010 with No LMP recorded. Patient is pregnant.   here for   Viability  scan due to bleeding on Friday 04/28/19. She had a a sharp pelvic pain, then a small amount of bright red bleeding  She hasn't had any bleeding since Friday. No significant cramping. She had an SAB a few months ago and is worried.   GYNECOLOGIC HISTORY: No LMP recorded. Patient is pregnant. Contraception:none  Menopausal hormone therapy: none         OB History    Gravida  2   Para  0   Term  0   Preterm  0   AB  1   Living  0     SAB  1   TAB  0   Ectopic  0   Multiple  0   Live Births  0              Patient Active Problem List   Diagnosis Date Noted  . Incomplete miscarriage 02/26/2019  . Vaginal bleeding affecting early pregnancy 02/26/2019  . Encounter for procreative genetic counseling 11/08/2018  . Situational anxiety 11/08/2015  . Palpitations 06/19/2013  . Dyspnea 06/19/2013  . Retrocecal appendicitis 07/21/2012    Past Medical History:  Diagnosis Date  . Anxiety   . Bradycardia   . Depression   . Migraines   . Mononucleosis   . Palpitations     Past Surgical History:  Procedure Laterality Date  . LAPAROSCOPIC APPENDECTOMY N/A 07/21/2012   Procedure: APPENDECTOMY LAPAROSCOPIC;  Surgeon: Haywood Lasso, MD;  Location: [facility];  Service: General;  Laterality: N/A;  . TONSILLECTOMY    . TONSILLECTOMY      Current Outpatient Medications  Medication Sig Dispense Refill  . Prenatal Vit-Fe Fumarate-FA (PRENATAL VITAMIN PO) Take by mouth.     No current facility-administered medications for this visit.     ALLERGIES: Patient has no known allergies.  Family History  Problem Relation Age of Onset  . Breast cancer Mother   . Lung cancer Paternal Grandfather   . Heart disease Paternal Grandfather   . Hypertension Maternal Grandmother   . Bladder Cancer Maternal  Grandfather   . Hypertension Maternal Grandfather   . Stroke Maternal Grandfather   . Heart disease Maternal Grandfather   . Heart failure Maternal Grandfather   . Breast cancer Paternal Grandmother     Social History   Socioeconomic History  . Marital status: Married    Spouse name: Not on file  . Number of children: 0  . Years of education: Not on file  . Highest education level: Not on file  Occupational History  . Occupation: Hotel manager: [employer]  Tobacco Use  . Smoking status: Never Smoker  . Smokeless tobacco: Never Used  Substance and Sexual Activity  . Alcohol use: Not Currently  . Drug use: No  . Sexual activity: Yes    Partners: Male    Birth control/protection: None  Other Topics Concern  . Not on file  Social History Narrative  . Not on file   Social Determinants of Health   Financial Resource Strain:   . Difficulty of Paying Living Expenses: Not on file  Food Insecurity:   . Worried About Charity fundraiser in the Last Year: Not on file  . Ran Out of Food in the Last  Year: Not on file  Transportation Needs:   . Lack of Transportation (Medical): Not on file  . Lack of Transportation (Non-Medical): Not on file  Physical Activity:   . Days of Exercise per Week: Not on file  . Minutes of Exercise per Session: Not on file  Stress:   . Feeling of Stress : Not on file  Social Connections:   . Frequency of Communication with Friends and Family: Not on file  . Frequency of Social Gatherings with Friends and Family: Not on file  . Attends Religious Services: Not on file  . Active Member of Clubs or Organizations: Not on file  . Attends Banker Meetings: Not on file  . Marital Status: Not on file  Intimate Partner Violence:   . Fear of Current or Ex-Partner: Not on file  . Emotionally Abused: Not on file  . Physically Abused: Not on file  . Sexually Abused: Not on file    Review of Systems  All other systems  reviewed and are negative.   PHYSICAL EXAMINATION:    BP 122/68     General appearance: alert, cooperative and appears stated age. Teary   Ultrasound images reviewed with the patient  ASSESSMENT First trimester bleeding (not heavy), not currently bleeding. Viable IUP on ultrasound Subchorionic clot, discussed that this does increase her risk of SAB She is RH negative and got rhogam at the end of 12/20. Given her early gestational age and only spotting, repeat rhogam wasn't given   PLAN She has an appointment with OB She is asking about progesterone for subchorionic bleeding. Per up to date, it isn't recommended. I did recommend that she discuss this further with her OB.  Call with any concerns prior to getting in to see OB

## 2019-05-10 DIAGNOSIS — Z3685 Encounter for antenatal screening for Streptococcus B: Secondary | ICD-10-CM | POA: Diagnosis not present

## 2019-05-10 DIAGNOSIS — Z3480 Encounter for supervision of other normal pregnancy, unspecified trimester: Secondary | ICD-10-CM | POA: Diagnosis not present

## 2019-05-10 DIAGNOSIS — Z3481 Encounter for supervision of other normal pregnancy, first trimester: Secondary | ICD-10-CM | POA: Diagnosis not present

## 2019-05-10 LAB — OB RESULTS CONSOLE GC/CHLAMYDIA
Chlamydia: NEGATIVE
Gonorrhea: NEGATIVE

## 2019-05-10 LAB — OB RESULTS CONSOLE ANTIBODY SCREEN: Antibody Screen: POSITIVE

## 2019-05-10 LAB — OB RESULTS CONSOLE ABO/RH: RH Type: NEGATIVE

## 2019-05-10 LAB — OB RESULTS CONSOLE RPR: RPR: NONREACTIVE

## 2019-05-10 LAB — OB RESULTS CONSOLE HEPATITIS B SURFACE ANTIGEN: Hepatitis B Surface Ag: NEGATIVE

## 2019-05-10 LAB — OB RESULTS CONSOLE HIV ANTIBODY (ROUTINE TESTING): HIV: NONREACTIVE

## 2019-05-10 LAB — OB RESULTS CONSOLE RUBELLA ANTIBODY, IGM: Rubella: IMMUNE

## 2019-05-15 ENCOUNTER — Encounter: Payer: Self-pay | Admitting: Certified Nurse Midwife

## 2019-05-17 ENCOUNTER — Encounter: Payer: Self-pay | Admitting: Certified Nurse Midwife

## 2019-05-17 DIAGNOSIS — O26851 Spotting complicating pregnancy, first trimester: Secondary | ICD-10-CM | POA: Diagnosis not present

## 2019-05-17 DIAGNOSIS — Z3A08 8 weeks gestation of pregnancy: Secondary | ICD-10-CM | POA: Diagnosis not present

## 2019-05-23 DIAGNOSIS — Z3400 Encounter for supervision of normal first pregnancy, unspecified trimester: Secondary | ICD-10-CM | POA: Diagnosis not present

## 2019-05-23 DIAGNOSIS — Z113 Encounter for screening for infections with a predominantly sexual mode of transmission: Secondary | ICD-10-CM | POA: Diagnosis not present

## 2019-06-12 DIAGNOSIS — Z36 Encounter for antenatal screening for chromosomal anomalies: Secondary | ICD-10-CM | POA: Diagnosis not present

## 2019-06-12 DIAGNOSIS — Z3682 Encounter for antenatal screening for nuchal translucency: Secondary | ICD-10-CM | POA: Diagnosis not present

## 2019-06-12 DIAGNOSIS — Z3A12 12 weeks gestation of pregnancy: Secondary | ICD-10-CM | POA: Diagnosis not present

## 2019-06-15 ENCOUNTER — Encounter: Payer: Self-pay | Admitting: Family Medicine

## 2019-06-15 ENCOUNTER — Ambulatory Visit (INDEPENDENT_AMBULATORY_CARE_PROVIDER_SITE_OTHER): Payer: BC Managed Care – PPO | Admitting: Family Medicine

## 2019-06-15 ENCOUNTER — Other Ambulatory Visit: Payer: Self-pay

## 2019-06-15 VITALS — BP 118/82 | HR 74 | Temp 98.5°F | Ht 66.0 in | Wt 162.0 lb

## 2019-06-15 DIAGNOSIS — F418 Other specified anxiety disorders: Secondary | ICD-10-CM | POA: Diagnosis not present

## 2019-06-15 DIAGNOSIS — H1031 Unspecified acute conjunctivitis, right eye: Secondary | ICD-10-CM

## 2019-06-15 DIAGNOSIS — Z3490 Encounter for supervision of normal pregnancy, unspecified, unspecified trimester: Secondary | ICD-10-CM

## 2019-06-15 MED ORDER — POLYMYXIN B-TRIMETHOPRIM 10000-0.1 UNIT/ML-% OP SOLN: 1.0000 [drp] | Freq: Four times a day (QID) | OPHTHALMIC | 0 refills | Status: DC

## 2019-06-15 NOTE — Patient Instructions (Addendum)
I would recommend meeting with therapist initially: [facility].Lori Rice [phone]  We could consider medicine like hydroxyzine for short term xanax if needed, but I would recommend discussing these options with Dr. Langston Masker.   Redness of eyes may be related to viral conjunctivitis but I did write some antibiotic drops if not improving into tomorrow or the next day.  You can start this sooner if you notice discolored discharge into the corners of the eyes.   Viral Conjunctivitis, Adult  Viral conjunctivitis is an inflammation of the clear membrane that covers the white part of your eye and the inner surface of your eyelid (conjunctiva). The inflammation is caused by a viral infection. The blood vessels in the conjunctiva become inflamed, causing the eye to become red or pink, and often itchy. Viral conjunctivitis can be easily passed from one person to another (is contagious). This condition is often called pink eye. What are the causes? This condition is caused by a virus. A virus is a type of contagious germ. It can be spread by touching objects that have been contaminated with the virus, such as doorknobs or towels. It can also be passed through droplets, such as from coughing or sneezing. What are the signs or symptoms? Symptoms of this condition include:  Eye redness.  Tearing or watery eyes.  Itchy and irritated eyes.  Burning feeling in the eyes.  Clear drainage from the eye.  Swollen eyelids.  A gritty feeling in the eye.  Light sensitivity. This condition often occurs with other symptoms, such as a fever, nausea, or a rash. How is this diagnosed? This condition is diagnosed with a medical history and physical exam. If you have discharge from your eye, the discharge may be tested to rule out other causes of conjunctivitis. How is this treated? Viral conjunctivitis does not respond to medicines that kill bacteria (antibiotics). Treatment for  viral conjunctivitis is directed at stopping a bacterial infection from developing in addition to the viral infection. Treatment also aims to relieve your symptoms, such as itching. This may be done with antihistamine drops or other eye medicines. Rarely, steroid eye drops or antiviral medicines may be prescribed. Follow these instructions at home: Medicines   Take or apply over-the-counter and prescription medicines only as told by your health care provider.  Be very careful to avoid touching the edge of the eyelid with the eye drop bottle or ointment tube when applying medicines to the affected eye. Being careful this way will stop you from spreading the infection to the other eye or to other people. Eye care  Avoid touching or rubbing your eyes.  Apply a warm, wet, clean washcloth to your eye for 10-20 minutes, 3-4 times per day or as told by your health care provider.  If you wear contact lenses, do not wear them until the inflammation is gone and your health care provider says it is safe to wear them again. Ask your health care provider how to sterilize or replace your contact lenses before using them again. Wear glasses until you can resume wearing contacts.  Avoid wearing eye makeup until the inflammation is gone. Throw away any old eye cosmetics that may be contaminated.  Gently wipe away any drainage from your eye with a warm, wet washcloth or a cotton ball. General instructions  Change or wash your pillowcase every day or as told by your health care provider.  Do not share towels, pillowcases, washcloths, eye makeup, makeup brushes, contact lenses, or glasses.  This may spread the infection.  Wash your hands often with soap and water. Use paper towels to dry your hands. If soap and water are not available, use hand sanitizer.  Try to avoid contact with other people for one week or as told by your health care provider. Contact a health care provider if:  Your symptoms do not  improve with treatment or they get worse.  You have increased pain.  Your vision becomes blurry.  You have a fever.  You have facial pain, redness, or swelling.  You have yellow or green drainage coming from your eye.  You have new symptoms. This information is not intended to replace advice given to you by your health care provider. Make sure you discuss any questions you have with your health care provider. Document Revised: 06/07/2018 Document Reviewed: 09/03/2015 Elsevier Patient Education  2020 Elsevier Inc.    Managing Anxiety, Adult After being diagnosed with an anxiety disorder, you may be relieved to know why you have felt or behaved a certain way. You may also feel overwhelmed about the treatment ahead and what it will mean for your life. With care and support, you can manage this condition and recover from it. How to manage lifestyle changes Managing stress and anxiety  Stress is your body's reaction to life changes and events, both good and bad. Most stress will last just a few hours, but stress can be ongoing and can lead to more than just stress. Although stress can play a major role in anxiety, it is not the same as anxiety. Stress is usually caused by something external, such as a deadline, test, or competition. Stress normally passes after the triggering event has ended.  Anxiety is caused by something internal, such as imagining a terrible outcome or worrying that something will go wrong that will devastate you. Anxiety often does not go away even after the triggering event is over, and it can become long-term (chronic) worry. It is important to understand the differences between stress and anxiety and to manage your stress effectively so that it does not lead to an anxious response. Talk with your health care provider or a counselor to learn more about reducing anxiety and stress. He or she may suggest tension reduction techniques, such as:  Music therapy. This can  include creating or listening to music that you enjoy and that inspires you.  Mindfulness-based meditation. This involves being aware of your normal breaths while not trying to control your breathing. It can be done while sitting or walking.  Centering prayer. This involves focusing on a word, phrase, or sacred image that means something to you and brings you peace.  Deep breathing. To do this, expand your stomach and inhale slowly through your nose. Hold your breath for 3-5 seconds. Then exhale slowly, letting your stomach muscles relax.  Self-talk. This involves identifying thought patterns that lead to anxiety reactions and changing those patterns.  Muscle relaxation. This involves tensing muscles and then relaxing them. Choose a tension reduction technique that suits your lifestyle and personality. These techniques take time and practice. Set aside 5-15 minutes a day to do them. Therapists can offer counseling and training in these techniques. The training to help with anxiety may be covered by some insurance plans. Other things you can do to manage stress and anxiety include:  Keeping a stress/anxiety diary. This can help you learn what triggers your reaction and then learn ways to manage your response.  Thinking about how you react  to certain situations. You may not be able to control everything, but you can control your response.  Making time for activities that help you relax and not feeling guilty about spending your time in this way.  Visual imagery and yoga can help you stay calm and relax.  Medicines Medicines can help ease symptoms. Medicines for anxiety include:  Anti-anxiety drugs.  Antidepressants. Medicines are often used as a primary treatment for anxiety disorder. Medicines will be prescribed by a health care provider. When used together, medicines, psychotherapy, and tension reduction techniques may be the most effective treatment. Relationships Relationships can play  a big part in helping you recover. Try to spend more time connecting with trusted friends and family members. Consider going to couples counseling, taking family education classes, or going to family therapy. Therapy can help you and others better understand your condition. How to recognize changes in your anxiety Everyone responds differently to treatment for anxiety. Recovery from anxiety happens when symptoms decrease and stop interfering with your daily activities at home or work. This may mean that you will start to:  Have better concentration and focus. Worry will interfere less in your daily thinking.  Sleep better.  Be less irritable.  Have more energy.  Have improved memory. It is important to recognize when your condition is getting worse. Contact your health care provider if your symptoms interfere with home or work and you feel like your condition is not improving. Follow these instructions at home: Activity  Exercise. Most adults should do the following: ? Exercise for at least 150 minutes each week. The exercise should increase your heart rate and make you sweat (moderate-intensity exercise). ? Strengthening exercises at least twice a week.  Get the right amount and quality of sleep. Most adults need 7-9 hours of sleep each night. Lifestyle   Eat a healthy diet that includes plenty of vegetables, fruits, whole grains, low-fat dairy products, and lean protein. Do not eat a lot of foods that are high in solid fats, added sugars, or salt.  Make choices that simplify your life.  Do not use any products that contain nicotine or tobacco, such as cigarettes, e-cigarettes, and chewing tobacco. If you need help quitting, ask your health care provider.  Avoid caffeine, alcohol, and certain over-the-counter cold medicines. These may make you feel worse. Ask your pharmacist which medicines to avoid. General instructions  Take over-the-counter and prescription medicines only as  told by your health care provider.  Keep all follow-up visits as told by your health care provider. This is important. Where to find support You can get help and support from these sources:  Self-help groups.  Online and [employer].  A trusted spiritual leader.  Couples counseling.  Family education classes.  Family therapy. Where to find more information You may find that joining a support group helps you deal with your anxiety. The following sources can help you locate counselors or support groups near you:  [place]: www.mentalhealthamerica.net  Anxiety and Depression Association of [place] (ADAA): https://www.clark.net/  National Alliance on Mental Illness (NAMI): www.nami.org Contact a health care provider if you:  Have a hard time staying focused or finishing daily tasks.  Spend many hours a day feeling worried about everyday life.  Become exhausted by worry.  Start to have headaches, feel tense, or have nausea.  Urinate more than normal.  Have diarrhea. Get help right away if you have:  A racing heart and shortness of breath.  Thoughts of hurting  yourself or others. If you ever feel like you may hurt yourself or others, or have thoughts about taking your own life, get help right away. You can go to your nearest emergency department or call:  Your local emergency services (911 in the [place]  A suicide crisis helpline, such as the National Suicide Prevention Lifeline at 936-127-4832. This is open 24 hours a day. Summary  Taking steps to learn and use tension reduction techniques can help calm you and help prevent triggering an anxiety reaction.  When used together, medicines, psychotherapy, and tension reduction techniques may be the most effective treatment.  Family, friends, and partners can play a big part in helping you recover from an anxiety disorder. This information is not intended to replace advice given to you by your health care  provider. Make sure you discuss any questions you have with your health care provider. Document Revised: 07/19/2018 Document Reviewed: 07/19/2018 Elsevier Patient Education  [employer].   If you have lab work done today you will be contacted with your lab results within the next 2 weeks.  If you have not heard from [place] then please contact us. The fastest way to get your results is to register for My Chart.   IF you received an x-ray today, you will receive an invoice from [facility]. Please contact [facility] at [phone] with questions or concerns regarding your invoice.   IF you received labwork today, you will receive an invoice from [place]. Please contact [facility] at [phone] with questions or concerns regarding your invoice.   Our billing staff will not be able to assist you with questions regarding bills from these companies.  You will be contacted with the lab results as soon as they are available. The fastest way to get your results is to activate your My Chart account. Instructions are located on the last page of this paperwork. If you have not heard from [place] regarding the results in 2 weeks, please contact this office.

## 2019-06-15 NOTE — Progress Notes (Signed)
Subjective:  Patient ID: Lori Rice, female    DOB: [birth year]  Age: 34 y.o. MRN: [id number]  CC:  Chief Complaint  Patient presents with  . eye irritation    pt thinks she may have pink eye. noticed it yesterday morning.Pt's R eye is red and some discharge in the morning. eye hasn't been crustedover. pt dosen't think she has gotten anything in her eye to cause the irratation.  Lori Rice Anxiety    pt states she has anxiety about her current pregnacy due to a miscarage prior. pt is wondering if their is anything she can take to help due to being pregnant she is aware that most medication wouldn't be good for the baby.    HPI Lori Rice presents for   Right eye irritation: Noticed yesterday morning when woke up. No itch/pain. Slight watering feeling. Min am d/c in am, some collection in inside corner. No sick contacts. Yellow d/c in [place] in am.  No sneezing, no cough/rhinorrhea/congestion.  No known FB.  Normal vision.  No photophobia.  No contact lenses.  Leaving for trip tomorrow.    Hearing Screening   125Hz 250Hz 500Hz 1000Hz 2000Hz 3000Hz 4000Hz 6000Hz 8000Hz  Right ear:           Left ear:             Visual Acuity Screening   Right eye Left eye Both eyes  Without correction: 20/13 20/13 20/13  With correction:       Anxiety: Situational. Currently pregnant. EDD 12/22/19, 12w 6d [place], OBGYN - [name] Lori Rice. Had prior loss with miscarriage in late December 2020. Has subchorionic hemorrhage with current pregnancy. Now followed by Dr. Lynnette Rice at [facility]. appt next Wednesday.  Feels like anxiety builds up at times, but getting by at times. Some other stressors - grandparents health is worse.   Did not like side effects with cymbalta in past - lowered BP. Other side effects. Xanax as needed in past. Not recently used.  No recent counseling. Has met with counselor in past.  Last counselor felt passive, but ok otherwise.   Depression screen Lori Rice 2/9 06/15/2019 05/16/2018  09/01/2016 03/23/2016 11/08/2015  Decreased Interest 0 0 0 0 0  Down, Depressed, Hopeless 0 0 0 0 0  PHQ - 2 Score 0 0 0 0 0   GAD 7 : Generalized Anxiety Score 06/15/2019  Nervous, Anxious, on Edge 1  Control/stop worrying 1  Worry too much - different things 1  Trouble relaxing 1  Restless 0  Easily annoyed or irritable 1  Afraid - awful might happen 1  Total GAD 7 Score 6       History Patient Active Problem List   Diagnosis Date Noted  . Incomplete miscarriage 02/26/2019  . Vaginal bleeding affecting early pregnancy 02/26/2019  . Encounter for procreative genetic counseling 11/08/2018  . Situational anxiety 11/08/2015  . Palpitations 06/19/2013  . Dyspnea 06/19/2013  . Retrocecal appendicitis 07/21/2012   Past Medical History:  Diagnosis Date  . Anxiety   . Bradycardia   . Depression   . Migraines   . Mononucleosis   . Palpitations    Past Surgical History:  Procedure Laterality Date  . LAPAROSCOPIC APPENDECTOMY N/A 07/21/2012   Procedure: APPENDECTOMY LAPAROSCOPIC;  Surgeon: Haywood Lasso, MD;  Location: [facility];  Service: General;  Laterality: N/A;  . TONSILLECTOMY    . TONSILLECTOMY     No Known Allergies Prior to Admission medications   Medication Sig  Start Date End Date Taking? Authorizing Provider  Prenatal Vit-Fe Fumarate-FA (PRENATAL VITAMIN PO) Take by mouth.   Yes [provider]   Social History   Socioeconomic History  . Marital status: Married    Spouse name: Not on file  . Number of children: 0  . Years of education: Not on file  . Highest education level: Not on file  Occupational History  . Occupation: Hotel manager: [employer]  Tobacco Use  . Smoking status: Never Smoker  . Smokeless tobacco: Never Used  Substance and Sexual Activity  . Alcohol use: Not Currently  . Drug use: No  . Sexual activity: Yes    Partners: Male    Birth control/protection: None  Other Topics Concern  . Not on file    Social History Narrative  . Not on file   Social Determinants of Health   Financial Resource Strain:   . Difficulty of Paying Living Expenses:   Food Insecurity:   . Worried About Charity fundraiser in the Last Year:   . Arboriculturist in the Last Year:   Transportation Needs:   . Film/video editor (Medical):   Lori Rice Lack of Transportation (Non-Medical):   Physical Activity:   . Days of Exercise per Week:   . Minutes of Exercise per Session:   Stress:   . Feeling of Stress :   Social Connections:   . Frequency of Communication with Friends and Family:   . Frequency of Social Gatherings with Friends and Family:   . Attends Religious Services:   . Active Member of Clubs or Organizations:   . Attends Archivist Meetings:   Lori Rice Marital Status:   Intimate Partner Violence:   . Fear of Current or Ex-Partner:   . Emotionally Abused:   Lori Rice Physically Abused:   . Sexually Abused:     Review of Systems   Objective:   Vitals:   06/15/19 1118  BP: 118/82  Pulse: 74  Temp: 98.5 F (36.9 C)  TempSrc: Temporal  SpO2: 98%  Weight: 162 lb (73.5 kg)  Height: '5\' 6"'$  (1.676 m)     Physical Exam Vitals reviewed.  Constitutional:      General: She is not in acute distress.    Appearance: She is well-developed.  HENT:     Head: Normocephalic and atraumatic.  Eyes:   Cardiovascular:     Rate and Rhythm: Normal rate.  Pulmonary:     Effort: Pulmonary effort is normal.  Neurological:     Mental Status: She is alert and oriented to person, place, and time.  Psychiatric:        Mood and Affect: Mood normal.        Behavior: Behavior normal.        Assessment & Plan:  Bari Lori Rice is a 34 y.o. female . Acute conjunctivitis of right eye, unspecified acute conjunctivitis type - Plan: trimethoprim-polymyxin b (POLYTRIM) ophthalmic solution  -Based on exam, suspected viral conjunctivitis.  Symptomatic care discussed, if increased discharge at [place] or worsening,  Polytrim drops provided.  RTC precautions  Situational anxiety Intrauterine pregnancy  -Does report some history of mood disorder, but has been overall doing well.  Likely adjustment disorder component versus situational anxiety with previous pregnancy loss, health issues with grandparents.   - Recommended counseling, number provided.  Also discussed anxiety management with coping techniques, option of medications with hydroxyzine, possibly Xanax but would prefer not to start benzodiazepine  if possible.  Did recommend she discuss meds with her OB/GYN at upcoming appointment.  No orders of the defined types were placed in this encounter.  Patient Instructions       If you have lab work done today you will be contacted with your lab results within the next 2 weeks.  If you have not heard from [place] then please contact us. The fastest way to get your results is to register for My Chart.   IF you received an x-ray today, you will receive an invoice from [facility]. Please contact [facility] at [phone] with questions or concerns regarding your invoice.   IF you received labwork today, you will receive an invoice from [place]. Please contact [facility] at [phone] with questions or concerns regarding your invoice.   Our billing staff will not be able to assist you with questions regarding bills from these companies.  You will be contacted with the lab results as soon as they are available. The fastest way to get your results is to activate your My Chart account. Instructions are located on the last page of this paperwork. If you have not heard from [place] regarding the results in 2 weeks, please contact this office.         Signed, Merri Ray, MD [facility]

## 2019-07-21 DIAGNOSIS — Z363 Encounter for antenatal screening for malformations: Secondary | ICD-10-CM | POA: Diagnosis not present

## 2019-07-21 DIAGNOSIS — Z3A18 18 weeks gestation of pregnancy: Secondary | ICD-10-CM | POA: Diagnosis not present

## 2019-07-21 DIAGNOSIS — Z3480 Encounter for supervision of other normal pregnancy, unspecified trimester: Secondary | ICD-10-CM | POA: Diagnosis not present

## 2019-09-19 DIAGNOSIS — Z3480 Encounter for supervision of other normal pregnancy, unspecified trimester: Secondary | ICD-10-CM | POA: Diagnosis not present

## 2019-09-19 DIAGNOSIS — O360910 Maternal care for other rhesus isoimmunization, first trimester, not applicable or unspecified: Secondary | ICD-10-CM | POA: Diagnosis not present

## 2019-09-19 DIAGNOSIS — Z23 Encounter for immunization: Secondary | ICD-10-CM | POA: Diagnosis not present

## 2019-09-19 DIAGNOSIS — Z3A26 26 weeks gestation of pregnancy: Secondary | ICD-10-CM | POA: Diagnosis not present

## 2019-10-03 DIAGNOSIS — O368930 Maternal care for other specified fetal problems, third trimester, not applicable or unspecified: Secondary | ICD-10-CM | POA: Diagnosis not present

## 2019-10-03 DIAGNOSIS — Z3A28 28 weeks gestation of pregnancy: Secondary | ICD-10-CM | POA: Diagnosis not present

## 2019-10-30 DIAGNOSIS — Z3A32 32 weeks gestation of pregnancy: Secondary | ICD-10-CM | POA: Diagnosis not present

## 2019-10-30 DIAGNOSIS — O368930 Maternal care for other specified fetal problems, third trimester, not applicable or unspecified: Secondary | ICD-10-CM | POA: Diagnosis not present

## 2019-11-08 ENCOUNTER — Inpatient Hospital Stay (HOSPITAL_COMMUNITY)
Admission: AD | Admit: 2019-11-08 | Discharge: 2019-11-08 | Disposition: A | Discharge status: Home / Self Care | Payer: BC Managed Care – PPO | Attending: Obstetrics and Gynecology | Admitting: Obstetrics and Gynecology

## 2019-11-08 ENCOUNTER — Encounter (HOSPITAL_COMMUNITY): Payer: Self-pay | Admitting: Obstetrics and Gynecology

## 2019-11-08 DIAGNOSIS — Z3A33 33 weeks gestation of pregnancy: Secondary | ICD-10-CM

## 2019-11-08 DIAGNOSIS — O4703 False labor before 37 completed weeks of gestation, third trimester: Secondary | ICD-10-CM

## 2019-11-08 DIAGNOSIS — Z3689 Encounter for other specified antenatal screening: Secondary | ICD-10-CM

## 2019-11-08 LAB — FETAL FIBRONECTIN: Fetal Fibronectin: NEGATIVE

## 2019-11-08 LAB — URINALYSIS, ROUTINE W REFLEX MICROSCOPIC
Bilirubin Urine: NEGATIVE
Glucose, UA: NEGATIVE mg/dL
Hgb urine dipstick: NEGATIVE
Ketones, ur: NEGATIVE mg/dL
Leukocytes,Ua: NEGATIVE
Nitrite: NEGATIVE
Protein, ur: NEGATIVE mg/dL
Specific Gravity, Urine: 1.003 — ABNORMAL LOW (ref 1.005–1.030)
pH: 6 (ref 5.0–8.0)

## 2019-11-08 LAB — WET PREP, GENITAL
Clue Cells Wet Prep HPF POC: NONE SEEN
Sperm: NONE SEEN
Trich, Wet Prep: NONE SEEN
Yeast Wet Prep HPF POC: NONE SEEN

## 2019-11-08 MED ORDER — NIFEDIPINE 10 MG PO CAPS
10.0000 mg | ORAL_CAPSULE | ORAL | Status: DC | PRN
  Administered 2019-11-08 (×2): 10 mg via ORAL
  Filled 2019-11-08 (×3): qty 1

## 2019-11-08 NOTE — MAU Note (Addendum)
..  Lori Rice is a 34 y.o. at [redacted]w[redacted]d here in MAU reporting: Irregular CTX that "are more frequently than they have ever been." Pt states they began to get consistent "some time this afternoon." Pt reports that she drank plenty of water and it did not relieve them.  +FM. Denies LOF. Pt is also complaining of 7/10 pain on "bottom of right rib" pt describes it as aching and it comes and goes.    Pain score: CTX "not painful only tight".  Vitals:   11/08/19 1953  BP: 121/76  Pulse: 82  Resp: 17  Temp: 98.3 F (36.8 C)  SpO2: 100%     FHT: monitors applied. Assessing. Lab orders placed from triage: [facility]

## 2019-11-08 NOTE — MAU Provider Note (Signed)
History     CSN: [id number]  Arrival date and time: 11/08/19 9833   First Provider Initiated Contact with Patient 11/08/19 2012      Chief Complaint  Patient presents with  . Contractions   Lori Rice is a 34 y.o. G2P0 at [redacted]w[redacted]d who presents to MAU with complaints of contractions. Patient reports that contractions started occurring this afternoon. Patient reports that contractions started being more frequent late this afternoon. Describes contractions as tightening but denies them being painful. Patient denies hx of PTL or PTD. Patient denies vaginal bleeding or LOF. +FM. Patient reports that over the last week has noticed for urinary frequency and vaginal discharge. Patient reports contractions is associated with right lower rib pain, patient reports baby girl has been located under her ribs since late second trimester. Rates pain 7/10- has not taken any medication for pain. Patient reports that she has been hydrating at home prior to coming to MAU.    OB History    Gravida  2   Para  0   Term  0   Preterm  0   AB  1   Living  0     SAB  1   TAB  0   Ectopic  0   Multiple  0   Live Births  0           Past Medical History:  Diagnosis Date  . Anxiety   . Bradycardia   . Depression   . Migraines   . Mononucleosis   . Palpitations     Past Surgical History:  Procedure Laterality Date  . LAPAROSCOPIC APPENDECTOMY N/A 07/21/2012   Procedure: APPENDECTOMY LAPAROSCOPIC;  Surgeon: Currie Paris, MD;  Location: [facility];  Service: General;  Laterality: N/A;  . TONSILLECTOMY    . TONSILLECTOMY      Family History  Problem Relation Age of Onset  . Breast cancer Mother   . Lung cancer Paternal Grandfather   . Heart disease Paternal Grandfather   . Hypertension Maternal Grandmother   . Bladder Cancer Maternal Grandfather   . Hypertension Maternal Grandfather   . Stroke Maternal Grandfather   . Heart disease Maternal Grandfather   . Heart failure  Maternal Grandfather   . Breast cancer Paternal Grandmother     Social History   Tobacco Use  . Smoking status: Never Smoker  . Smokeless tobacco: Never Used  Substance Use Topics  . Alcohol use: Not Currently  . Drug use: No    Allergies: No Known Allergies  Medications Prior to Admission  Medication Sig Dispense Refill Last Dose  . Prenatal Vit-Fe Fumarate-FA (PRENATAL VITAMIN PO) Take by mouth.   11/08/2019 at Unknown time  . trimethoprim-polymyxin b (POLYTRIM) ophthalmic solution Place 1 drop into the right eye every 6 (six) hours. For 1 week. 10 mL 0     Review of Systems  Constitutional: Negative.   Respiratory: Negative.   Cardiovascular: Negative.   Gastrointestinal: Positive for abdominal pain. Negative for constipation, diarrhea, nausea and vomiting.  Genitourinary: Positive for frequency and vaginal discharge. Negative for difficulty urinating, dysuria, pelvic pain, urgency and vaginal bleeding.  Musculoskeletal: Negative.   Neurological: Negative.   Psychiatric/Behavioral: Negative.    Physical Exam   Blood pressure 121/76, pulse 82, temperature 98.3 F (36.8 C), temperature source Oral, resp. rate 17, height 5\' 6"  (1.676 m), weight 90.3 kg, last menstrual period 12/01/2018, SpO2 100 %.  Physical Exam Constitutional:      Appearance: Normal appearance.  HENT:     Head: Normocephalic.  Cardiovascular:     Rate and Rhythm: Normal rate and regular rhythm.  Pulmonary:     Effort: Pulmonary effort is normal. No respiratory distress.     Breath sounds: Normal breath sounds. No wheezing.  Abdominal:     Palpations: Abdomen is soft. There is no mass.     Tenderness: There is no abdominal tenderness. There is no guarding.     Comments: Gravid appropriate for gestational age   Skin:    General: Skin is warm and dry.  Neurological:     Mental Status: She is alert and oriented to person, place, and time.  Psychiatric:        Mood and Affect: Mood normal.         Behavior: Behavior normal.        Thought Content: Thought content normal.    Initial cervical examination:  Dilation: Closed Effacement (%): Thick Exam by:: Lanice Shirts CNM  Fetal monitoring:  140/moderate/+accels/ no decelerations  Contractions 3 minutes, mild by palpation    MAU Course  Procedures  MDM Patient denies recent IC or vaginal bleeding, FFN collected prior to cervical examination   Orders Placed This Encounter  Procedures  . Wet prep, genital  . Urinalysis, Routine w reflex microscopic Urine, Clean Catch  . Fetal fibronectin  . Encourage/Reinforce Importance Po Fluids   Meds ordered this encounter  Medications  . NIFEdipine (PROCARDIA) capsule 10 mg   Procardia x 2 doses given in MAU- patient reports contractions have spaced out significantly and pain has decreased.  Labs reviewed:  Results for orders placed or performed during the hospital encounter of 11/08/19 (from the past 24 hour(s))  Urinalysis, Routine w reflex microscopic Urine, Clean Catch     Status: Abnormal   Collection Time: 11/08/19  8:04 PM  Result Value Ref Range   Color, Urine STRAW (A) YELLOW   APPearance CLEAR CLEAR   Specific Gravity, Urine 1.003 (L) 1.005 - 1.030   pH 6.0 5.0 - 8.0   Glucose, UA NEGATIVE NEGATIVE mg/dL   Hgb urine dipstick NEGATIVE NEGATIVE   Bilirubin Urine NEGATIVE NEGATIVE   Ketones, ur NEGATIVE NEGATIVE mg/dL   Protein, ur NEGATIVE NEGATIVE mg/dL   Nitrite NEGATIVE NEGATIVE   Leukocytes,Ua NEGATIVE NEGATIVE  Fetal fibronectin     Status: None   Collection Time: 11/08/19  8:24 PM  Result Value Ref Range   Fetal Fibronectin NEGATIVE NEGATIVE  Wet prep, genital     Status: Abnormal   Collection Time: 11/08/19  8:25 PM  Result Value Ref Range   Yeast Wet Prep HPF POC NONE SEEN NONE SEEN   Trich, Wet Prep NONE SEEN NONE SEEN   Clue Cells Wet Prep HPF POC NONE SEEN NONE SEEN   WBC, Wet Prep HPF POC MANY (A) NONE SEEN   Sperm NONE SEEN    FFN negative, wet prep  Negative. Cervix rechecked after 1.5 hours - no cervical change  Occasional mild contractions with UI noted on monitor after procardia   PTL precautions discussed. Discussed reasons to return to MAU. Follow up as scheduled in the office. Return to MAU as needed. Pt stable at time of discharge.   Assessment and Plan   1. Preterm uterine contractions in third trimester, antepartum   2. [redacted] weeks gestation of pregnancy   3. NST (non-stress test) reactive    Discharge home Follow up as scheduled in the office for prenatal care Return to MAU as  needed for reasons discussed and/or emergencies  Hydration and FKC  PTL precautions    Follow-up Information    [place] Follow up.   Why: Follow up as scheduled for prenatal care and return to MAU as needed for reasons discussed  Contact information: [address] [place] [address] [phone]              Allergies as of 11/08/2019   No Known Allergies     Medication List    TAKE these medications   PRENATAL VITAMIN PO Take by mouth.   trimethoprim-polymyxin b ophthalmic solution Commonly known as: Polytrim Place 1 drop into the right eye every 6 (six) hours. For 1 week.        Sharyon Cable CNM 11/08/2019, 22:45 PM

## 2019-11-09 DIAGNOSIS — Z3A33 33 weeks gestation of pregnancy: Secondary | ICD-10-CM | POA: Diagnosis not present

## 2019-11-09 DIAGNOSIS — O368930 Maternal care for other specified fetal problems, third trimester, not applicable or unspecified: Secondary | ICD-10-CM | POA: Diagnosis not present

## 2019-11-09 LAB — GC/CHLAMYDIA PROBE AMP (~~LOC~~) NOT AT ARMC
Chlamydia: NEGATIVE
Comment: NEGATIVE
Comment: NORMAL
Neisseria Gonorrhea: NEGATIVE

## 2019-11-22 DIAGNOSIS — Z3685 Encounter for antenatal screening for Streptococcus B: Secondary | ICD-10-CM | POA: Diagnosis not present

## 2019-11-22 LAB — OB RESULTS CONSOLE GBS: GBS: NEGATIVE

## 2019-11-24 ENCOUNTER — Telehealth: Payer: Self-pay

## 2019-11-24 NOTE — Telephone Encounter (Signed)
NOTES ON FILE FROM [facility] [phone], SENT REFERRAL TO SCHEDULING 

## 2019-11-27 LAB — OB RESULTS CONSOLE GBS: GBS: NEGATIVE
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
## 2019-12-04 ENCOUNTER — Ambulatory Visit (INDEPENDENT_AMBULATORY_CARE_PROVIDER_SITE_OTHER): Payer: BC Managed Care – PPO | Admitting: Cardiology

## 2019-12-04 ENCOUNTER — Encounter: Payer: Self-pay | Admitting: Cardiology

## 2019-12-04 ENCOUNTER — Other Ambulatory Visit: Payer: Self-pay

## 2019-12-04 VITALS — BP 110/72 | HR 118 | Ht 66.0 in | Wt 198.0 lb

## 2019-12-04 DIAGNOSIS — R002 Palpitations: Secondary | ICD-10-CM | POA: Diagnosis not present

## 2019-12-04 NOTE — Patient Instructions (Addendum)
Medication Instructions:  Your physician recommends that you continue on your current medications as directed. Please refer to the Current Medication list given to you today.  *If you need a refill on your cardiac medications before your next appointment, please call your pharmacy*  Lab Work: None ordered.  If you have labs (blood work) drawn today and your tests are completely normal, you will receive your results only by:  MyChart Message (if you have MyChart) OR  A paper copy in the mail If you have any lab test that is abnormal or we need to change your treatment, we will call you to review the results.  Testing/Procedures: Your physician has requested that you have an echocardiogram. Echocardiography is a painless test that uses sound waves to create images of your heart. It provides your doctor with information about the size and shape of your heart and how well your hearts chambers and valves are working. This procedure takes approximately one hour. There are no restrictions for this procedure.  Please schedule for ECHO  Follow-Up: At [facility], you and your health needs are our priority.  As part of our continuing mission to provide you with exceptional heart care, we have created designated Provider Care Teams.  These Care Teams include your primary Cardiologist (physician) and Advanced Practice Providers (APPs -  Physician Assistants and Nurse Practitioners) who all work together to provide you with the care you need, when you need it.  We recommend signing up for the patient portal called "MyChart".  Sign up information is provided on this After Visit Summary.  MyChart is used to connect with patients for Virtual Visits (Telemedicine).  Patients are able to view lab/test results, encounter notes, upcoming appointments, etc.  Non-urgent messages can be sent to your provider as well.   To learn more about what you can do with MyChart, go to [email or web address].    Your  next appointment:   Your physician wants you to follow-up in: as needed with Dr. Lalla Brothers.

## 2019-12-05 DIAGNOSIS — G43719 Chronic migraine without aura, intractable, without status migrainosus: Secondary | ICD-10-CM | POA: Diagnosis not present

## 2019-12-06 ENCOUNTER — Telehealth (HOSPITAL_COMMUNITY): Payer: Self-pay | Admitting: *Deleted

## 2019-12-06 NOTE — Telephone Encounter (Signed)
Preadmission screen  

## 2019-12-07 ENCOUNTER — Telehealth (HOSPITAL_COMMUNITY): Payer: Self-pay | Admitting: *Deleted

## 2019-12-07 ENCOUNTER — Encounter (HOSPITAL_COMMUNITY): Payer: Self-pay | Admitting: *Deleted

## 2019-12-07 NOTE — Telephone Encounter (Signed)
Preadmission screen  

## 2019-12-08 ENCOUNTER — Other Ambulatory Visit: Payer: Self-pay

## 2019-12-08 ENCOUNTER — Ambulatory Visit (HOSPITAL_COMMUNITY)
Admission: RE | Admit: 2019-12-08 | Discharge: 2019-12-08 | Disposition: A | Discharge status: Home / Self Care | Payer: BC Managed Care – PPO | Source: Ambulatory Visit | Attending: Cardiology | Admitting: Cardiology

## 2019-12-08 DIAGNOSIS — R0600 Dyspnea, unspecified: Secondary | ICD-10-CM | POA: Diagnosis not present

## 2019-12-08 DIAGNOSIS — R002 Palpitations: Secondary | ICD-10-CM | POA: Diagnosis not present

## 2019-12-08 LAB — ECHOCARDIOGRAM COMPLETE
Area-P 1/2: 2.28 cm2
S' Lateral: 3.2 cm

## 2019-12-08 NOTE — Progress Notes (Signed)
  Echocardiogram 2D Echocardiogram has been performed.  Lori Rice 12/08/2019, 8:57 AM

## 2019-12-11 ENCOUNTER — Ambulatory Visit: Payer: No Typology Code available for payment source | Admitting: Certified Nurse Midwife

## 2019-12-13 ENCOUNTER — Other Ambulatory Visit (HOSPITAL_COMMUNITY)
Admission: RE | Admit: 2019-12-13 | Discharge: 2019-12-13 | Disposition: A | Discharge status: Home / Self Care | Payer: BC Managed Care – PPO | Source: Ambulatory Visit | Attending: Obstetrics and Gynecology | Admitting: Obstetrics and Gynecology

## 2019-12-13 DIAGNOSIS — Z3A39 39 weeks gestation of pregnancy: Secondary | ICD-10-CM | POA: Diagnosis not present

## 2019-12-13 DIAGNOSIS — Z20822 Contact with and (suspected) exposure to covid-19: Secondary | ICD-10-CM | POA: Diagnosis not present

## 2019-12-13 DIAGNOSIS — O26893 Other specified pregnancy related conditions, third trimester: Secondary | ICD-10-CM | POA: Diagnosis not present

## 2019-12-13 DIAGNOSIS — Z01812 Encounter for preprocedural laboratory examination: Secondary | ICD-10-CM | POA: Insufficient documentation

## 2019-12-13 DIAGNOSIS — Z23 Encounter for immunization: Secondary | ICD-10-CM | POA: Diagnosis not present

## 2019-12-13 LAB — SARS CORONAVIRUS 2 (TAT 6-24 HRS): SARS Coronavirus 2: NEGATIVE

## 2019-12-15 ENCOUNTER — Inpatient Hospital Stay (HOSPITAL_COMMUNITY)
Admission: AD | Admit: 2019-12-15 | Discharge: 2019-12-18 | DRG: 788 | Disposition: A | Discharge status: Home / Self Care | Payer: BC Managed Care – PPO | Attending: Obstetrics and Gynecology | Admitting: Obstetrics and Gynecology

## 2019-12-15 ENCOUNTER — Inpatient Hospital Stay (HOSPITAL_COMMUNITY): Payer: BC Managed Care – PPO

## 2019-12-15 ENCOUNTER — Other Ambulatory Visit: Payer: Self-pay

## 2019-12-15 DIAGNOSIS — O26893 Other specified pregnancy related conditions, third trimester: Secondary | ICD-10-CM | POA: Diagnosis present

## 2019-12-15 DIAGNOSIS — Z3490 Encounter for supervision of normal pregnancy, unspecified, unspecified trimester: Secondary | ICD-10-CM | POA: Diagnosis present

## 2019-12-15 DIAGNOSIS — Z20822 Contact with and (suspected) exposure to covid-19: Secondary | ICD-10-CM | POA: Diagnosis present

## 2019-12-15 DIAGNOSIS — Z3A39 39 weeks gestation of pregnancy: Secondary | ICD-10-CM | POA: Diagnosis not present

## 2019-12-15 MED ORDER — ACETAMINOPHEN 325 MG PO TABS: 650.0000 mg | ORAL_TABLET | ORAL | Status: DC | PRN

## 2019-12-15 MED ORDER — LACTATED RINGERS IV SOLN: 500.0000 mL | INTRAVENOUS | Status: DC | PRN

## 2019-12-15 MED ORDER — LIDOCAINE HCL (PF) 1 % IJ SOLN: 30.0000 mL | INTRAMUSCULAR | Status: DC | PRN

## 2019-12-15 MED ORDER — OXYTOCIN BOLUS FROM INFUSION: 333.0000 mL | Freq: Once | INTRAVENOUS | Status: DC

## 2019-12-15 MED ORDER — LACTATED RINGERS IV SOLN: INTRAVENOUS | Status: DC

## 2019-12-15 MED ORDER — OXYCODONE-ACETAMINOPHEN 5-325 MG PO TABS: 1.0000 | ORAL_TABLET | ORAL | Status: DC | PRN

## 2019-12-15 MED ORDER — OXYTOCIN-SODIUM CHLORIDE 30-0.9 UT/500ML-% IV SOLN
2.5000 [IU]/h | INTRAVENOUS | Status: DC
  Filled 2019-12-15: qty 500

## 2019-12-15 MED ORDER — MISOPROSTOL 25 MCG QUARTER TABLET
25.0000 ug | ORAL_TABLET | ORAL | Status: DC | PRN
  Administered 2019-12-16 (×2): 25 ug via VAGINAL
  Filled 2019-12-15 (×2): qty 1

## 2019-12-15 MED ORDER — OXYCODONE-ACETAMINOPHEN 5-325 MG PO TABS: 2.0000 | ORAL_TABLET | ORAL | Status: DC | PRN

## 2019-12-15 MED ORDER — BUTORPHANOL TARTRATE 1 MG/ML IJ SOLN: 1.0000 mg | INTRAMUSCULAR | Status: DC | PRN

## 2019-12-15 MED ORDER — SOD CITRATE-CITRIC ACID 500-334 MG/5ML PO SOLN
30.0000 mL | ORAL | Status: DC | PRN
  Administered 2019-12-16: 30 mL via ORAL
  Filled 2019-12-15: qty 15

## 2019-12-15 MED ORDER — TERBUTALINE SULFATE 1 MG/ML IJ SOLN
0.2500 mg | Freq: Once | INTRAMUSCULAR | Status: AC | PRN
  Administered 2019-12-16: 0.25 mg via SUBCUTANEOUS
  Filled 2019-12-15 (×2): qty 1

## 2019-12-15 MED ORDER — ONDANSETRON HCL 4 MG/2ML IJ SOLN: 4.0000 mg | Freq: Four times a day (QID) | INTRAMUSCULAR | Status: DC | PRN

## 2019-12-16 ENCOUNTER — Encounter (HOSPITAL_COMMUNITY)
Admission: AD | Disposition: A | Discharge status: Home / Self Care | Payer: Self-pay | Source: Home / Self Care | Attending: Obstetrics and Gynecology

## 2019-12-16 ENCOUNTER — Inpatient Hospital Stay (HOSPITAL_COMMUNITY): Payer: BC Managed Care – PPO | Admitting: Anesthesiology

## 2019-12-16 ENCOUNTER — Encounter (HOSPITAL_COMMUNITY): Payer: Self-pay | Admitting: Obstetrics and Gynecology

## 2019-12-16 LAB — CBC
HCT: 36 % (ref 36.0–46.0)
Hemoglobin: 11.9 g/dL — ABNORMAL LOW (ref 12.0–15.0)
MCH: 29.8 pg (ref 26.0–34.0)
MCHC: 33.1 g/dL (ref 30.0–36.0)
MCV: 90 fL (ref 80.0–100.0)
Platelets: 145 10*3/uL — ABNORMAL LOW (ref 150–400)
RBC: 4 MIL/uL (ref 3.87–5.11)
RDW: 12.3 % (ref 11.5–15.5)
WBC: 11.1 10*3/uL — ABNORMAL HIGH (ref 4.0–10.5)
nRBC: 0 % (ref 0.0–0.2)

## 2019-12-16 LAB — TYPE AND SCREEN
ABO/RH(D): A NEG
Antibody Screen: POSITIVE

## 2019-12-16 LAB — RPR: RPR Ser Ql: NONREACTIVE

## 2019-12-16 SURGERY — Surgical Case
Anesthesia: General | Wound class: Clean Contaminated

## 2019-12-16 MED ORDER — DIPHENHYDRAMINE HCL 25 MG PO CAPS
25.0000 mg | ORAL_CAPSULE | Freq: Four times a day (QID) | ORAL | Status: DC | PRN
  Administered 2019-12-17: 25 mg via ORAL
  Filled 2019-12-16: qty 1

## 2019-12-16 MED ORDER — EPINEPHRINE PF 1 MG/ML IJ SOLN
INTRAMUSCULAR | Status: AC
  Filled 2019-12-16: qty 1

## 2019-12-16 MED ORDER — LIDOCAINE-EPINEPHRINE (PF) 2 %-1:200000 IJ SOLN
INTRAMUSCULAR | Status: DC | PRN
  Administered 2019-12-16: 4 mL via EPIDURAL
  Administered 2019-12-16: 6 mL via EPIDURAL

## 2019-12-16 MED ORDER — PHENYLEPHRINE 40 MCG/ML (10ML) SYRINGE FOR IV PUSH (FOR BLOOD PRESSURE SUPPORT): 80.0000 ug | PREFILLED_SYRINGE | INTRAVENOUS | Status: DC | PRN

## 2019-12-16 MED ORDER — OXYCODONE HCL 5 MG PO TABS: 5.0000 mg | ORAL_TABLET | Freq: Once | ORAL | Status: DC | PRN

## 2019-12-16 MED ORDER — MEPERIDINE HCL 25 MG/ML IJ SOLN
INTRAMUSCULAR | Status: DC | PRN
  Administered 2019-12-16 (×2): 12.5 mg via INTRAVENOUS

## 2019-12-16 MED ORDER — OXYCODONE-ACETAMINOPHEN 5-325 MG PO TABS
1.0000 | ORAL_TABLET | ORAL | Status: DC | PRN
  Administered 2019-12-18 (×2): 1 via ORAL
  Filled 2019-12-16 (×2): qty 1

## 2019-12-16 MED ORDER — WITCH HAZEL-GLYCERIN EX PADS: 1.0000 "application " | MEDICATED_PAD | CUTANEOUS | Status: DC | PRN

## 2019-12-16 MED ORDER — FENTANYL CITRATE (PF) 100 MCG/2ML IJ SOLN
INTRAMUSCULAR | Status: AC
  Filled 2019-12-16: qty 2

## 2019-12-16 MED ORDER — OXYTOCIN-SODIUM CHLORIDE 30-0.9 UT/500ML-% IV SOLN
INTRAVENOUS | Status: AC
  Filled 2019-12-16: qty 500

## 2019-12-16 MED ORDER — DIPHENHYDRAMINE HCL 50 MG/ML IJ SOLN: 12.5000 mg | INTRAMUSCULAR | Status: DC | PRN

## 2019-12-16 MED ORDER — SENNOSIDES-DOCUSATE SODIUM 8.6-50 MG PO TABS
2.0000 | ORAL_TABLET | ORAL | Status: DC
  Administered 2019-12-17 (×2): 2 via ORAL
  Filled 2019-12-16 (×2): qty 2

## 2019-12-16 MED ORDER — COCONUT OIL OIL
1.0000 "application " | TOPICAL_OIL | Status: DC | PRN
  Administered 2019-12-18: 1 via TOPICAL

## 2019-12-16 MED ORDER — SODIUM CHLORIDE 0.9 % IV SOLN
2.0000 g | Freq: Once | INTRAVENOUS | Status: AC
  Administered 2019-12-16: 2 g via INTRAVENOUS
  Filled 2019-12-16: qty 2

## 2019-12-16 MED ORDER — KETOROLAC TROMETHAMINE 30 MG/ML IJ SOLN
30.0000 mg | Freq: Once | INTRAMUSCULAR | Status: AC | PRN
  Administered 2019-12-16: 30 mg via INTRAVENOUS

## 2019-12-16 MED ORDER — LACTATED RINGERS AMNIOINFUSION
INTRAVENOUS | Status: DC
  Administered 2019-12-16: 150 mL/h via INTRAUTERINE

## 2019-12-16 MED ORDER — MENTHOL 3 MG MT LOZG: 1.0000 | LOZENGE | OROMUCOSAL | Status: DC | PRN

## 2019-12-16 MED ORDER — EPHEDRINE 5 MG/ML INJ: 10.0000 mg | INTRAVENOUS | Status: DC | PRN

## 2019-12-16 MED ORDER — SIMETHICONE 80 MG PO CHEW: 80.0000 mg | CHEWABLE_TABLET | ORAL | Status: DC | PRN

## 2019-12-16 MED ORDER — MEPERIDINE HCL 25 MG/ML IJ SOLN
INTRAMUSCULAR | Status: AC
  Filled 2019-12-16: qty 1

## 2019-12-16 MED ORDER — DEXAMETHASONE SODIUM PHOSPHATE 10 MG/ML IJ SOLN
INTRAMUSCULAR | Status: AC
  Filled 2019-12-16: qty 1

## 2019-12-16 MED ORDER — TETANUS-DIPHTH-ACELL PERTUSSIS 5-2.5-18.5 LF-MCG/0.5 IM SUSP: 0.5000 mL | Freq: Once | INTRAMUSCULAR | Status: DC

## 2019-12-16 MED ORDER — ONDANSETRON HCL 4 MG/2ML IJ SOLN
INTRAMUSCULAR | Status: DC | PRN
  Administered 2019-12-16: 4 mg via INTRAVENOUS

## 2019-12-16 MED ORDER — ONDANSETRON HCL 4 MG/2ML IJ SOLN: 4.0000 mg | Freq: Once | INTRAMUSCULAR | Status: DC | PRN

## 2019-12-16 MED ORDER — OXYTOCIN-SODIUM CHLORIDE 30-0.9 UT/500ML-% IV SOLN
INTRAVENOUS | Status: DC | PRN
  Administered 2019-12-16: 300 mL via INTRAVENOUS

## 2019-12-16 MED ORDER — IBUPROFEN 600 MG PO TABS
600.0000 mg | ORAL_TABLET | Freq: Four times a day (QID) | ORAL | Status: DC | PRN
  Administered 2019-12-17 – 2019-12-18 (×4): 600 mg via ORAL
  Filled 2019-12-16 (×4): qty 1

## 2019-12-16 MED ORDER — LACTATED RINGERS IV SOLN: 500.0000 mL | Freq: Once | INTRAVENOUS | Status: DC

## 2019-12-16 MED ORDER — SODIUM CHLORIDE (PF) 0.9 % IJ SOLN
INTRAMUSCULAR | Status: DC | PRN
  Administered 2019-12-16: 12 mL/h via EPIDURAL

## 2019-12-16 MED ORDER — MORPHINE SULFATE (PF) 0.5 MG/ML IJ SOLN
INTRAMUSCULAR | Status: AC
  Filled 2019-12-16: qty 10

## 2019-12-16 MED ORDER — FENTANYL CITRATE (PF) 100 MCG/2ML IJ SOLN
INTRAMUSCULAR | Status: DC | PRN
Start: 2019-12-16 — End: 2019-12-16
  Administered 2019-12-16: 100 ug via EPIDURAL

## 2019-12-16 MED ORDER — OXYCODONE HCL 5 MG/5ML PO SOLN: 5.0000 mg | Freq: Once | ORAL | Status: DC | PRN

## 2019-12-16 MED ORDER — FENTANYL-BUPIVACAINE-NACL 0.5-0.125-0.9 MG/250ML-% EP SOLN
12.0000 mL/h | EPIDURAL | Status: DC | PRN
  Filled 2019-12-16: qty 250

## 2019-12-16 MED ORDER — LIDOCAINE HCL (PF) 1 % IJ SOLN
INTRAMUSCULAR | Status: DC | PRN
  Administered 2019-12-16: 8 mL via EPIDURAL

## 2019-12-16 MED ORDER — LIDOCAINE HCL (PF) 2 % IJ SOLN
INTRAMUSCULAR | Status: AC
  Filled 2019-12-16: qty 20

## 2019-12-16 MED ORDER — MEASLES, MUMPS & RUBELLA VAC IJ SOLR: 0.5000 mL | Freq: Once | INTRAMUSCULAR | Status: DC

## 2019-12-16 MED ORDER — LACTATED RINGERS IV SOLN: INTRAVENOUS | Status: DC

## 2019-12-16 MED ORDER — ACETAMINOPHEN 325 MG PO TABS
650.0000 mg | ORAL_TABLET | ORAL | Status: DC | PRN
  Administered 2019-12-17 – 2019-12-18 (×4): 650 mg via ORAL
  Filled 2019-12-16 (×4): qty 2

## 2019-12-16 MED ORDER — DEXAMETHASONE SODIUM PHOSPHATE 10 MG/ML IJ SOLN
INTRAMUSCULAR | Status: DC | PRN
  Administered 2019-12-16: 10 mg via INTRAVENOUS

## 2019-12-16 MED ORDER — DIBUCAINE (PERIANAL) 1 % EX OINT: 1.0000 "application " | TOPICAL_OINTMENT | CUTANEOUS | Status: DC | PRN

## 2019-12-16 MED ORDER — SODIUM CHLORIDE 0.9 % IV SOLN
INTRAVENOUS | Status: AC
  Filled 2019-12-16: qty 2

## 2019-12-16 MED ORDER — HYDROMORPHONE HCL 1 MG/ML IJ SOLN: 0.2500 mg | INTRAMUSCULAR | Status: DC | PRN

## 2019-12-16 MED ORDER — MORPHINE SULFATE (PF) 0.5 MG/ML IJ SOLN
INTRAMUSCULAR | Status: DC | PRN
Start: 2019-12-16 — End: 2019-12-16
  Administered 2019-12-16: 3 mg via EPIDURAL

## 2019-12-16 MED ORDER — SIMETHICONE 80 MG PO CHEW
80.0000 mg | CHEWABLE_TABLET | Freq: Three times a day (TID) | ORAL | Status: DC
  Administered 2019-12-17 – 2019-12-18 (×3): 80 mg via ORAL
  Filled 2019-12-16 (×3): qty 1

## 2019-12-16 MED ORDER — SIMETHICONE 80 MG PO CHEW
80.0000 mg | CHEWABLE_TABLET | ORAL | Status: DC
  Administered 2019-12-17 (×2): 80 mg via ORAL
  Filled 2019-12-16 (×2): qty 1

## 2019-12-16 MED ORDER — OXYTOCIN-SODIUM CHLORIDE 30-0.9 UT/500ML-% IV SOLN
2.5000 [IU]/h | INTRAVENOUS | Status: AC
  Administered 2019-12-16 – 2019-12-17 (×4): 2.5 [IU]/h via INTRAVENOUS
  Filled 2019-12-16: qty 500

## 2019-12-16 MED ORDER — PHENYLEPHRINE 40 MCG/ML (10ML) SYRINGE FOR IV PUSH (FOR BLOOD PRESSURE SUPPORT)
80.0000 ug | PREFILLED_SYRINGE | INTRAVENOUS | Status: DC | PRN
  Filled 2019-12-16: qty 10

## 2019-12-16 MED ORDER — OXYTOCIN-SODIUM CHLORIDE 30-0.9 UT/500ML-% IV SOLN
1.0000 m[IU]/min | INTRAVENOUS | Status: DC
  Administered 2019-12-16 (×2): 2 m[IU]/min via INTRAVENOUS

## 2019-12-16 MED ORDER — ONDANSETRON HCL 4 MG/2ML IJ SOLN
INTRAMUSCULAR | Status: AC
  Filled 2019-12-16: qty 2

## 2019-12-16 MED ORDER — TERBUTALINE SULFATE 1 MG/ML IJ SOLN: 0.2500 mg | Freq: Once | INTRAMUSCULAR | Status: DC | PRN

## 2019-12-16 MED ORDER — KETOROLAC TROMETHAMINE 30 MG/ML IJ SOLN
INTRAMUSCULAR | Status: AC
  Filled 2019-12-16: qty 1

## 2019-12-16 MED ORDER — PRENATAL MULTIVITAMIN CH
1.0000 | ORAL_TABLET | Freq: Every day | ORAL | Status: DC
  Administered 2019-12-18: 1 via ORAL
  Filled 2019-12-16: qty 1

## 2019-12-16 MED ORDER — ZOLPIDEM TARTRATE 5 MG PO TABS: 5.0000 mg | ORAL_TABLET | Freq: Every evening | ORAL | Status: DC | PRN

## 2019-12-16 SURGICAL SUPPLY — 30 items
CHLORAPREP W/TINT 26ML (MISCELLANEOUS) ×3 IMPLANT
CLAMP CORD UMBIL (MISCELLANEOUS) IMPLANT
CLOSURE WOUND 1/2 X4 (GAUZE/BANDAGES/DRESSINGS) ×1
CLOTH BEACON ORANGE TIMEOUT ST (SAFETY) ×3 IMPLANT
DRSG OPSITE POSTOP 4X10 (GAUZE/BANDAGES/DRESSINGS) ×3 IMPLANT
ELECT REM PT RETURN 9FT ADLT (ELECTROSURGICAL) ×3
ELECTRODE REM PT RTRN 9FT ADLT (ELECTROSURGICAL) ×1 IMPLANT
EXTRACTOR VACUUM M CUP 4 TUBE (SUCTIONS) IMPLANT
EXTRACTOR VACUUM M CUP 4' TUBE (SUCTIONS)
GLOVE BIOGEL PI IND STRL 7.0 (GLOVE) ×1 IMPLANT
GLOVE BIOGEL PI INDICATOR 7.0 (GLOVE) ×2
GLOVE SURG ORTHO 8.0 STRL STRW (GLOVE) ×3 IMPLANT
GOWN STRL REUS W/TWL LRG LVL3 (GOWN DISPOSABLE) ×6 IMPLANT
KIT ABG SYR 3ML LUER SLIP (SYRINGE) ×3 IMPLANT
NEEDLE HYPO 25X5/8 SAFETYGLIDE (NEEDLE) ×3 IMPLANT
NS IRRIG 1000ML POUR BTL (IV SOLUTION) ×3 IMPLANT
PACK C SECTION WH (CUSTOM PROCEDURE TRAY) ×3 IMPLANT
PAD OB MATERNITY 4.3X12.25 (PERSONAL CARE ITEMS) ×3 IMPLANT
PENCIL SMOKE EVAC W/HOLSTER (ELECTROSURGICAL) ×3 IMPLANT
STRIP CLOSURE SKIN 1/2X4 (GAUZE/BANDAGES/DRESSINGS) ×2 IMPLANT
SUT MNCRL 0 VIOLET CTX 36 (SUTURE) ×4 IMPLANT
SUT MON AB 4-0 PS1 27 (SUTURE) ×3 IMPLANT
SUT MONOCRYL 0 CTX 36 (SUTURE) ×8
SUT PDS AB 1 CT  36 (SUTURE)
SUT PDS AB 1 CT 36 (SUTURE) IMPLANT
SUT VIC AB 1 CTX 36 (SUTURE)
SUT VIC AB 1 CTX36XBRD ANBCTRL (SUTURE) IMPLANT
TOWEL OR 17X24 6PK STRL BLUE (TOWEL DISPOSABLE) ×3 IMPLANT
TRAY FOLEY W/BAG SLVR 14FR LF (SET/KITS/TRAYS/PACK) ×3 IMPLANT
WATER STERILE IRR 1000ML POUR (IV SOLUTION) ×6 IMPLANT

## 2019-12-16 NOTE — Anesthesia Postprocedure Evaluation (Signed)
Anesthesia Post Note  Patient: Lori Rice  Procedure(s) Performed: CESAREAN SECTION (N/A )     Patient location during evaluation: Mother Baby Anesthesia Type: Epidural Level of consciousness: oriented and awake and alert Pain management: pain level controlled Vital Signs Assessment: post-procedure vital signs reviewed and stable Respiratory status: spontaneous breathing and respiratory function stable Cardiovascular status: blood pressure returned to baseline and stable Postop Assessment: no headache, no backache, no apparent nausea or vomiting and able to ambulate Anesthetic complications: no   No complications documented.  Last Vitals:  Vitals:   12/16/19 1900 12/16/19 2145  BP: 114/66 115/67  Pulse: 69 (!) 114  Resp: 17   Temp:  37.2 C  SpO2:      Last Pain:  Vitals:   12/16/19 1730  TempSrc:   PainSc: 0-No pain   Pain Goal:                   Trevor Iha

## 2019-12-16 NOTE — Op Note (Signed)
Cesarean Section Procedure Note  Pre-operative Diagnosis: IUP at 39 weeks, Fetal intolerance to labor  Post-operative Diagnosis: same  Surgeon: Turner Daniels   Assistants:    Anesthesia: Epidural  Procedure:  Low Segment Transverse cesarean section  Procedure Details  The patient was seen in the Holding Room. The risks, benefits, complications, treatment options, and expected outcomes were discussed with the patient.  The patient concurred with the proposed plan, giving informed consent.  The site of surgery properly noted/marked.. A Time Out was held and the above information confirmed.  After induction of anesthesia, the patient was draped and prepped in the usual sterile manner. A Pfannenstiel incision was made and carried down through the subcutaneous tissue to the fascia. Fascial incision was made and extended transversely. The fascia was separated from the underlying rectus tissue superiorly and inferiorly. The peritoneum was identified and entered. Peritoneal incision was extended longitudinally. The utero-vesical peritoneal reflection was incised transversely and the bladder flap was bluntly freed from the lower uterine segment. A low transverse uterine incision was made. Delivered from vertex presentation was a baby with Apgar scores of 8 at one minute and 9 at five minutes. After the umbilical cord was clamped and cut cord blood was obtained for evaluation. The placenta was removed intact and appeared normal. The uterine outline, tubes and ovaries appeared normal. The uterine incision was closed with running locked sutures of 0 monocryl and imbricated with 0 monocryl. Hemostasis was observed. Lavage was carried out until clear. The peritoneum was then closed with 0 monocryl and rectus muscles plicated in the midline.  After hemostasis was assured, the fascia was then reapproximated with running sutures of 0 Vicryl. Irrigation was applied and after adequate hemostasis was assured, the skin  was reapproximated with subcutaneous sutures using 4-0 monocryl.  Instrument, sponge, and needle counts were correct prior the abdominal closure and at the conclusion of the case. The patient received 2 grams cefotetan preoperatively.  Findings: Viable female  Estimated Blood Loss:  333cc         Specimens: Placenta was sent to labor and delivery         Complications:  None

## 2019-12-16 NOTE — Progress Notes (Signed)
Pt in hall walking.  Intermittently trace fhr

## 2019-12-16 NOTE — Anesthesia Preprocedure Evaluation (Addendum)
Anesthesia Evaluation  Patient identified by MRN, date of birth, ID band Patient awake    Reviewed: Allergy & Precautions, H&P , NPO status , Patient's Chart, lab work & pertinent test results  Airway Mallampati: II  TM Distance: >3 FB Neck ROM: Full    Dental no notable dental hx.    Pulmonary neg pulmonary ROS,    Pulmonary exam normal breath sounds clear to auscultation       Cardiovascular Exercise Tolerance: Good Normal cardiovascular exam Rhythm:Regular Rate:Normal     Neuro/Psych negative neurological ROS  negative psych ROS   GI/Hepatic negative GI ROS, Neg liver ROS,   Endo/Other  negative endocrine ROS  Renal/GU negative Renal ROS  negative genitourinary   Musculoskeletal   Abdominal   Peds negative pediatric ROS (+)  Hematology negative hematology ROS (+)   Anesthesia Other Findings   Reproductive/Obstetrics (+) Pregnancy                             Anesthesia Physical  Anesthesia Plan  ASA: II and emergent  Anesthesia Plan: Epidural   Post-op Pain Management:    Induction: Intravenous  PONV Risk Score and Plan: 3 and Treatment may vary due to age or medical condition  Airway Management Planned: Nasal Cannula and Natural Airway  Additional Equipment:   Intra-op Plan:   Post-operative Plan:   Informed Consent: I have reviewed the patients History and Physical, chart, labs and discussed the procedure including the risks, benefits and alternatives for the proposed anesthesia with the patient or authorized representative who has indicated his/her understanding and acceptance.     Dental advisory given  Plan Discussed with: CRNA and Anesthesiologist  Anesthesia Plan Comments:        Anesthesia Quick Evaluation

## 2019-12-16 NOTE — Progress Notes (Signed)
Patient ID: Lori Rice, female   DOB: [birth year], 34 y.o.   MRN: [id number] CTSP for fetal bradycardia 7-8 minutes which required Pit off, position change, and terbutaline.  FHR has recovered in 140s with moderate reactive. This has happened several times and now she is 4-5cm/95/0 station.  We used an amnioinfusion for deep variables which seemed to help. Discussed the fetal intolerance to labor remote from delivery, and I recommend cesarean section.  Kelsay and her family agree. Risks and benefits of C/S were discussed.  All questions were answered and informed consent was obtained.  Plan to proceed with low segment transverse Cesarean Section.

## 2019-12-16 NOTE — Transfer of Care (Signed)
Immediate Anesthesia Transfer of Care Note  Patient: Lori Rice  Procedure(s) Performed: CESAREAN SECTION (N/A )  Patient Location: PACU  Anesthesia Type:Epidural  Level of Consciousness: awake, alert  and oriented  Airway & Oxygen Therapy: Patient Spontanous Breathing  Post-op Assessment: Report given to RN and Post -op Vital signs reviewed and stable  Post vital signs: Reviewed and stable  Last Vitals:  Vitals Value Taken Time  BP    Temp    Pulse    Resp 13 12/16/19 2147  SpO2    Vitals shown include unvalidated device data.  Last Pain:  Vitals:   12/16/19 1730  TempSrc:   PainSc: 0-No pain         Complications: No complications documented.

## 2019-12-16 NOTE — H&P (Signed)
Lori Rice is a 34 y.o. female presenting for IOL for elective reasons.  Pregnancy complicated by fetal SUA with normal [place] growth and anatomy (85% EFW).  GBS -.  Episodes of maternal tachycardia with normal ECHO by her cardiologist.. OB History    Gravida  2   Para  0   Term  0   Preterm  0   AB  1   Living  0     SAB  1   TAB  0   Ectopic  0   Multiple  0   Live Births  0          Past Medical History:  Diagnosis Date  . Anxiety   . Bradycardia   . Depression   . Migraines   . Mononucleosis   . Palpitations   . Tachycardia    Past Surgical History:  Procedure Laterality Date  . LAPAROSCOPIC APPENDECTOMY N/A 07/21/2012   Procedure: APPENDECTOMY LAPAROSCOPIC;  Surgeon: Currie Paris, MD;  Location: [facility];  Service: General;  Laterality: N/A;  . TONSILLECTOMY    . TONSILLECTOMY     Family History: family history includes Bladder Cancer in her maternal grandfather; Breast cancer in her mother and paternal grandmother; Heart disease in her maternal grandfather and paternal grandfather; Heart failure in her maternal grandfather; Hypertension in her maternal grandfather and maternal grandmother; Lung cancer in her paternal grandfather; Stroke in her maternal grandfather. Social History:  reports that she has never smoked. She has never used smokeless tobacco. She reports previous alcohol use. She reports that she does not use drugs.     Maternal Diabetes: No Genetic Screening: Normal Maternal Ultrasounds/Referrals: Other: Fetal Ultrasounds or other Referrals:  None Maternal Substance Abuse:  No Significant Maternal Medications:  None Significant Maternal Lab Results:  Group B Strep negative Other Comments:  None  Review of Systems History Dilation: 1 Effacement (%): Thick Station: Ballotable Exam by:: Sherol Dade RN Blood pressure 97/64, pulse 65, temperature 98.2 F (36.8 C), resp. rate 16, height 5\' 6"  (1.676 m), weight 92.3 kg, last  menstrual period 12/01/2018. Exam Physical Exam  Prenatal labs: ABO, Rh: --/--/A NEG (10/16 0030) Antibody: POS (10/16 0030) Rubella: Immune (03/10 0000) RPR: Nonreactive (03/10 0000)  HBsAg: Negative (03/10 0000)  HIV: Non-reactive (03/10 0000)  GBS: Negative/-- (09/27 0000)   Assessment/Plan: IUP at 39 weeks  Induction of labor.  Cytotec the AROM/Pitocin Anticipate SVD   08-01-2005 12/16/2019, 8:13 AM

## 2019-12-16 NOTE — Anesthesia Procedure Notes (Signed)
Epidural Patient location during procedure: OB Start time: 12/16/2019 11:45 AM End time: 12/16/2019 11:53 AM  Staffing Anesthesiologist: Mellody Dance, MD Performed: anesthesiologist   Preanesthetic Checklist Completed: patient identified, IV checked, site marked, risks and benefits discussed, monitors and equipment checked, pre-op evaluation and timeout performed  Epidural Patient position: sitting Prep: DuraPrep Patient monitoring: heart rate, cardiac monitor, continuous pulse ox and blood pressure Approach: midline Location: L2-L3 Injection technique: LOR saline  Needle:  Needle type: Tuohy  Needle gauge: 17 G Needle length: 9 cm Needle insertion depth: 4 cm Catheter size: 20 Guage Catheter at skin depth: 9 cm Test dose: negative and Other  Assessment Events: blood not aspirated, injection not painful, no injection resistance and negative IV test  Additional Notes Informed consent obtained prior to proceeding including risk of failure, 1% risk of PDPH, risk of minor discomfort and bruising.  Discussed rare but serious complications including epidural abscess, permanent nerve injury, epidural hematoma.  Discussed alternatives to epidural analgesia and patient desires to proceed.  Timeout performed pre-procedure verifying patient name, procedure, and platelet count.  Patient tolerated procedure well.

## 2019-12-16 NOTE — Plan of Care (Signed)

## 2019-12-17 ENCOUNTER — Encounter (HOSPITAL_COMMUNITY): Payer: Self-pay | Admitting: Obstetrics and Gynecology

## 2019-12-17 LAB — CBC
HCT: 31.7 % — ABNORMAL LOW (ref 36.0–46.0)
Hemoglobin: 10.9 g/dL — ABNORMAL LOW (ref 12.0–15.0)
MCH: 30.5 pg (ref 26.0–34.0)
MCHC: 34.4 g/dL (ref 30.0–36.0)
MCV: 88.8 fL (ref 80.0–100.0)
Platelets: 141 10*3/uL — ABNORMAL LOW (ref 150–400)
RBC: 3.57 MIL/uL — ABNORMAL LOW (ref 3.87–5.11)
RDW: 12 % (ref 11.5–15.5)
WBC: 20.4 10*3/uL — ABNORMAL HIGH (ref 4.0–10.5)
nRBC: 0 % (ref 0.0–0.2)

## 2019-12-17 LAB — RH IG WORKUP (INCLUDES ABO/RH)
ABO/RH(D): A NEG
Gestational Age(Wks): 39.1
Unit division: 0

## 2019-12-17 NOTE — Progress Notes (Signed)
Subjective: Postpartum Day 1: Cesarean Delivery Patient reports tolerating PO and + flatus.    Objective: Vital signs in last 24 hours: Temp:  [97.8 F (36.6 C)-99.2 F (37.3 C)] 98.1 F (36.7 C) (10/17 0218) Pulse Rate:  [58-114] 58 (10/17 0520) Resp:  [15-24] 20 (10/17 0520) BP: (99-133)/(55-90) 120/77 (10/17 0520) SpO2:  [98 %-100 %] 100 % (10/17 0520)  Physical Exam:  General: alert, cooperative and no distress Lochia: appropriate Uterine Fundus: firm Incision: healing well DVT Evaluation: No evidence of DVT seen on physical exam.  Recent Labs    12/16/19 0030 12/17/19 0415  HGB 11.9* 10.9*  HCT 36.0 31.7*    Assessment/Plan: Status post Cesarean section. Doing well postoperatively.  Continue current care.  Turner Daniels 12/17/2019, 10:11 AM

## 2019-12-17 NOTE — Lactation Note (Signed)
This note was copied from a baby's chart. Lactation Consultation Note  Patient Name: Lori Rice [id number] Date: 12/17/2019 Reason for consult: Initial assessment;Primapara;1st time breastfeeding  Initial visit with 14 hours old infant of P1 mother. Mother states baby has been feeding but only for a few minutes at a time. Mother explains infant falls sleep quickly. Noted short shafted nipples.   Talked to mother about hand expression, demonstrated technique and able to collect ~3 mL in a spoon. Showed how to spoon-feed colostrum to infant.   Assisted mother with a football latch to right breast. Provided support with pillows.  Infant unable to sustain latch, pops on and off breast. Talked to mother about using a nipple shield. Mother verbalized agreement. Placed 20 mm nipple shield and assisted with latch. Baby was able to latch successfully after a couple of attempts. Noted suckling and swallowing. Mother massaged and compressed breast. Baby unlatched after 20 minutes, observed colostrum pooled inside NS. Baby sleepy and uninterested. Mother placed baby skin to skin.   Set up DEBP for stimulation and supplementation Reviewed milk storage and proper care of parts. Mother was given a manual pump to help with her short shafted nipples. Demonstrated how to use it, colostrum easily pumped. Baby had a stool and changed her diaper.   Reviewed with mother average size of a NB stomach. Encourage to follow babies' hunger and fullness cues. Reviewed importance to offer the breast 8 to 12 times in a 24-hour period for proper stimulation and to establish good milk supply. Reviewed colostrum benefits for baby. Reviewed breastfeeding basics. Discussed milk coming to volume. Reviewed newborn behavior and expectations with parents during first days of life. Promoted maternal rest, hydration and food intake. Encouraged to contact [facility] for support when ready to breastfeed baby and recommended to request help for  questions or concerns.    All questions answered at this time.   Maternal Data Formula Feeding for Exclusion: No Has patient been taught Hand Expression?: Yes Does the patient have breastfeeding experience prior to this delivery?: No  Feeding Feeding Type: Breast Fed  LATCH Score Latch: Repeated attempts needed to sustain latch, nipple held in mouth throughout feeding, stimulation needed to elicit sucking reflex.  Audible Swallowing: A few with stimulation  Type of Nipple: Everted at rest and after stimulation (short)  Comfort (Breast/Nipple): Soft / non-tender  Hold (Positioning): Assistance needed to correctly position infant at breast and maintain latch.  LATCH Score: 7  Interventions Interventions: Breast feeding basics reviewed;Assisted with latch;Skin to skin;Breast massage;Hand express;Adjust position;DEBP;Support pillows;Position options;Expressed milk  Lactation Tools Discussed/Used Tools: Pump;Nipple Shields Nipple shield size: 20 Breast pump type: Double-Electric Breast Pump WIC Program: No Pump Review: Setup, frequency, and cleaning;Milk Storage Initiated by:: Lori Rice IBCLC Date initiated:: 12/18/19   Consult Status Consult Status: Follow-up Date: 12/18/19 Follow-up type: In-patient    Ninah Moccio A Higuera Ancidey 12/17/2019, 11:57 AM

## 2019-12-17 NOTE — Progress Notes (Signed)
Call Dr. Rana Snare to inform him that the patients honey comb dressing was 75% covered. Dr.Lowe gave me a verbal order to add a pressure dressing over the honey comb dressing.

## 2019-12-18 MED ORDER — IBUPROFEN 600 MG PO TABS: 600.0000 mg | ORAL_TABLET | Freq: Four times a day (QID) | ORAL | 0 refills | Status: DC | PRN

## 2019-12-18 MED ORDER — OXYCODONE HCL 5 MG PO CAPS: 5.0000 mg | ORAL_CAPSULE | Freq: Four times a day (QID) | ORAL | 0 refills | Status: DC | PRN

## 2019-12-18 MED ORDER — ACETAMINOPHEN 325 MG PO TABS: 650.0000 mg | ORAL_TABLET | Freq: Four times a day (QID) | ORAL | 0 refills | Status: DC | PRN

## 2019-12-18 NOTE — Discharge Summary (Signed)
° °  Postpartum Discharge Summary ° °Date of Service updated 12/18/19 ° °   °Patient Name: Lori Rice °DOB: 05/12/1985 °MRN: [id number] ° °Date of admission: 12/15/2019 °Delivery date:12/16/2019  °Delivering provider: LOWE, DAVID  °Date of discharge: 12/18/2019 ° °Admitting diagnosis: Encounter for induction of labor [Z34.90] °Intrauterine pregnancy: [redacted]w[redacted]d     °Secondary diagnosis:  Active Problems: °  Encounter for induction of labor ° °Additional problems:     °Discharge diagnosis: Term Pregnancy Delivered                                              °Post partum procedures: °Augmentation: AROM and Pitocin °Complications: None ° °Hospital course: Induction of Labor With Cesarean Section   °33 y.o. yo G2P1011 at [redacted]w[redacted]d was admitted to the hospital 12/15/2019 for induction of labor. Patient had a labor course significant for term pregnancy. The patient went for cesarean section due to Non-Reassuring FHR. Delivery details are as follows: °Membrane Rupture Time/Date: 9:20 AM ,12/16/2019   °Delivery Method:C-Section, Vacuum Assisted  °Details of operation can be found in separate operative Note.  Patient had an uncomplicated postpartum course. She is ambulating, tolerating a regular diet, passing flatus, and urinating well.  Patient is discharged home in stable condition on 12/18/19.     ° °Newborn Data: °Birth date:12/16/2019  °Birth time:9:09 PM  °Gender:Female  °Living status:Living  °Apgars:8 ,8  °Weight:3484 g                               ° ° °Magnesium Sulfate received: No °BMZ received: No °Rhophylac:No °MMR:No °T-DaP:Given prenatally °Flu: No °Transfusion:No ° °Physical exam  °Vitals:  ° 12/17/19 1400 12/17/19 1624 12/17/19 2200 12/18/19 0510  °BP: 92/61 103/63 107/60 113/66  °Pulse: 71 88 78 60  °Resp: 18 18 18 18  °Temp: 98.1 °F (36.7 °C) 97.6 °F (36.4 °C) 98 °F (36.7 °C) 98 °F (36.7 °C)  °TempSrc:    Oral  °SpO2: 98% 99% 100% 100%  °Weight:      °Height:      ° °General: alert, cooperative and no  distress °Lochia: appropriate °Uterine Fundus: firm °Incision: Healing well with no significant drainage °DVT Evaluation: No evidence of DVT seen on physical exam. °Labs: °Lab Results  °Component Value Date  ° WBC 20.4 (H) 12/17/2019  ° HGB 10.9 (L) 12/17/2019  ° HCT 31.7 (L) 12/17/2019  ° MCV 88.8 12/17/2019  ° PLT 141 (L) 12/17/2019  ° °CMP Latest Ref Rng & Units 03/23/2016  °Glucose 65 - 99 mg/dL 86  °BUN 6 - 20 mg/dL 13  °Creatinine 0.57 - 1.00 mg/dL 0.82  °Sodium 134 - 144 mmol/L 141  °Potassium 3.5 - 5.2 mmol/L 4.4  °Chloride 96 - 106 mmol/L 104  °CO2 18 - 29 mmol/L 23  °Calcium 8.7 - 10.2 mg/dL 8.9  °Total Protein 6.0 - 8.3 g/dL -  °Total Bilirubin 0.3 - 1.2 mg/dL -  °Alkaline Phos 39 - 117 U/L -  °AST 0 - 37 U/L -  °ALT 0 - 35 U/L -  ° °Edinburgh Score: °No flowsheet data found. ° ° ° °After visit meds:  °Allergies as of 12/18/2019   °No Known Allergies °  °  °Medication List  °  °STOP taking these medications   °calcium carbonate 500 MG chewable tablet °Commonly known   known as: TUMS - dosed in mg elemental calcium   doxylamine (Sleep) 25 MG tablet Commonly known as: UNISOM     TAKE these medications   acetaminophen 325 MG tablet Commonly known as: TYLENOL Take 2 tablets (650 mg total) by mouth every 6 (six) hours as needed for mild pain (temperature > 101.5.).   ibuprofen 600 MG tablet Commonly known as: ADVIL Take 1 tablet (600 mg total) by mouth every 6 (six) hours as needed for headache, mild pain, moderate pain or cramping.   oxycodone 5 MG capsule Commonly known as: OXY-IR Take 1 capsule (5 mg total) by mouth every 6 (six) hours as needed.   PRENATAL VITAMIN PO Take 1 tablet by mouth daily.        Discharge home in stable condition Infant Feeding: Breast Infant Disposition:home with mother Discharge instruction: per After Visit Summary and Postpartum booklet. Activity: Advance as tolerated. Pelvic rest for 6 weeks.  Diet: routine diet Anticipated Birth Control:  Unsure Postpartum Appointment:6 weeks Additional Postpartum F/U: Postpartum Depression checkup Future Appointments:No future appointments. Follow up Visit:      12/18/2019 Allena Katz, MD

## 2019-12-18 NOTE — Progress Notes (Signed)
MOB was referred for history of depression/anxiety. * Referral screened out by Clinical Social Worker because none of the following criteria appear to apply: ~ History of anxiety/depression during this pregnancy, or of post-partum depression following prior delivery. ~ Diagnosis of anxiety and/or depression within last 3 years OR * MOB's symptoms currently being treated with medication and/or therapy.    Please contact the Clinical Social Worker if needs arise, by MOB request, or if MOB scores greater than 9/yes to question 10 on Edinburgh Postpartum Depression Screen.   Bethannie Iglehart S. Sherrilynn Gudgel, MSW, LCSW [facility] [phone]    

## 2019-12-18 NOTE — Progress Notes (Signed)
Honeycomb dressing changed per provider order.  Incision approximated with no signs or symptoms of infection.  Reviewed incision care with the patient.  Patient acknowledged instructions.

## 2019-12-18 NOTE — Lactation Note (Signed)
This note was copied from a baby's chart. Lactation Consultation Note  Patient Name: Lori Rice [id number] Date: 12/18/2019 Reason for consult: Follow-up assessment  P1 mother whose infant is now 32 hours old.  This is a term baby at 39+1 weeks.  Family is packed and ready for discharge, however, baby is fussy.  Offered to assist with latching and mother agreeable.  Assisted to latch in the cross cradle position on the right breast.  Mother has tenderness to her nipples and we reviewed how to obtain a deep latch.  Baby was initially hard to console prior to latching and discussed tips to calm her and latch easier.  Mother will observe for feeding cues and continue to feed 8-12 times/24 hours or sooner if baby shows cues.  She will hand express before/after feedings and feed back any EBM she obtains to baby.    Mother has a manual pump and a DEBP for home use.  Discussed using EBM/coconut oil/comfort gels to ease her sore nipples.  Mother appreciative.  RN updated and in room at the end of my visit for final discharge instructions.  Father present.   Maternal Data    Feeding Feeding Type: Breast Fed  LATCH Score Latch: Grasps breast easily, tongue down, lips flanged, rhythmical sucking.  Audible Swallowing: A few with stimulation  Type of Nipple: Everted at rest and after stimulation  Comfort (Breast/Nipple): Filling, red/small blisters or bruises, mild/mod discomfort  Hold (Positioning): Assistance needed to correctly position infant at breast and maintain latch.  LATCH Score: 7  Interventions Interventions: Breast feeding basics reviewed;Assisted with latch;Skin to skin;Breast massage;Breast compression;Adjust position;Comfort gels;Support pillows  Lactation Tools Discussed/Used Tools: Nipple Shields Nipple shield size: 20   Consult Status Consult Status: Complete Date: 12/18/19 Follow-up type: Call as needed    Irene Pap Rayla Pember 12/18/2019, 12:24 PM

## 2019-12-19 LAB — SURGICAL PATHOLOGY

## 2020-01-30 DIAGNOSIS — Z1389 Encounter for screening for other disorder: Secondary | ICD-10-CM | POA: Diagnosis not present

## 2020-02-19 DIAGNOSIS — M542 Cervicalgia: Secondary | ICD-10-CM | POA: Diagnosis not present

## 2020-02-19 DIAGNOSIS — G43719 Chronic migraine without aura, intractable, without status migrainosus: Secondary | ICD-10-CM | POA: Diagnosis not present

## 2020-03-28 DIAGNOSIS — G43719 Chronic migraine without aura, intractable, without status migrainosus: Secondary | ICD-10-CM | POA: Diagnosis not present

## 2020-06-26 DIAGNOSIS — G43719 Chronic migraine without aura, intractable, without status migrainosus: Secondary | ICD-10-CM | POA: Diagnosis not present

## 2020-09-25 ENCOUNTER — Ambulatory Visit: Payer: BC Managed Care – PPO | Admitting: Family Medicine

## 2020-09-25 ENCOUNTER — Other Ambulatory Visit: Payer: Self-pay

## 2020-09-25 ENCOUNTER — Ambulatory Visit (INDEPENDENT_AMBULATORY_CARE_PROVIDER_SITE_OTHER): Payer: BC Managed Care – PPO | Admitting: Family Medicine

## 2020-09-25 ENCOUNTER — Encounter: Payer: Self-pay | Admitting: Family Medicine

## 2020-09-25 VITALS — BP 114/66 | HR 55 | Temp 98.1°F | Resp 15 | Ht 66.0 in | Wt 147.2 lb

## 2020-09-25 DIAGNOSIS — F418 Other specified anxiety disorders: Secondary | ICD-10-CM | POA: Diagnosis not present

## 2020-09-25 MED ORDER — ESCITALOPRAM OXALATE 10 MG PO TABS: 10.0000 mg | ORAL_TABLET | Freq: Every day | ORAL | 2 refills | Status: DC

## 2020-09-25 NOTE — Progress Notes (Signed)
Subjective:  Patient ID: Lori Rice, female    DOB: 04-09-1985  Age: 35 y.o. MRN: [id number]  CC:  Chief Complaint  Patient presents with   Depression    Pt was being seen [facility] therapy who has prescribed lexapro 10 mg and is asking that you take over prescribing.     HPI Lori Rice presents for   Anxiety: Last discussed in April 2021.  Did not tolerate side effects of Cymbalta.   Had baby girl in October 2021. Did ok until weaned from breastfeeding, more depression/anxiety in March. Had been meeting with therapist. Subsequently has been seen by online psychiatric provider, [facility].  Intake testing, indicated more anxiety. Treated with Lexapro 10mg  qd. Hydroxyzine if needed - has not used.  Tolerating Lexapro well. No new side effects. Would like to stay on same regimen.  Looking to find new therapist.  Topiramate 150mg  qd, with rizatriptan as needed for migraine - rx by [facility] specialist - working well.   GAD 7 : Generalized Anxiety Score 09/25/2020 06/15/2019  Nervous, Anxious, on Edge 1 1  Control/stop worrying 1 1  Worry too much - different things 1 1  Trouble relaxing 1 1  Restless 0 0  Easily annoyed or irritable 0 1  Afraid - awful might happen 0 1  Total GAD 7 Score 4 6     Depression screen [facility] 2/9 09/25/2020 06/15/2019 05/16/2018 09/01/2016 03/23/2016  Decreased Interest 0 0 0 0 0  Down, Depressed, Hopeless 0 0 0 0 0  PHQ - 2 Score 0 0 0 0 0  Altered sleeping 0 - - - -  Tired, decreased energy 0 - - - -  Change in appetite 0 - - - -  Feeling bad or failure about yourself  0 - - - -  Trouble concentrating 0 - - - -  Moving slowly or fidgety/restless 0 - - - -  Suicidal thoughts 0 - - - -  PHQ-9 Score 0 - - - -    History Patient Active Problem List   Diagnosis Date Noted   Encounter for induction of labor 12/15/2019   Incomplete miscarriage 02/26/2019   Vaginal bleeding affecting early pregnancy 02/26/2019   Encounter for procreative  genetic counseling 11/08/2018   Situational anxiety 11/08/2015   Palpitations 06/19/2013   Dyspnea 06/19/2013   Retrocecal appendicitis 07/21/2012   Past Medical History:  Diagnosis Date   Anxiety    Bradycardia    Depression    Migraines    Mononucleosis    Palpitations    Tachycardia    Past Surgical History:  Procedure Laterality Date   CESAREAN SECTION N/A 12/16/2019   Procedure: CESAREAN SECTION;  Surgeon: 07/23/2012, MD;  Location: [facility];  Service: Obstetrics;  Laterality: N/A;   LAPAROSCOPIC APPENDECTOMY N/A 07/21/2012   Procedure: APPENDECTOMY LAPAROSCOPIC;  Surgeon: Candice Camp, MD;  Location: [facility];  Service: General;  Laterality: N/A;   TONSILLECTOMY     TONSILLECTOMY     No Known Allergies Prior to Admission medications   Medication Sig Start Date End Date Taking? Authorizing Provider  acetaminophen (TYLENOL) 325 MG tablet Take 2 tablets (650 mg total) by mouth every 6 (six) hours as needed for mild pain (temperature > 101.5.). 12/18/19   Currie Paris, MD  ibuprofen (ADVIL) 600 MG tablet Take 1 tablet (600 mg total) by mouth every 6 (six) hours as needed for headache, mild pain, moderate pain or cramping. 12/18/19  Harold Hedge, MD  oxycodone (OXY-IR) 5 MG capsule Take 1 capsule (5 mg total) by mouth every 6 (six) hours as needed. 12/18/19   Harold Hedge, MD  Prenatal Vit-Fe Fumarate-FA (PRENATAL VITAMIN PO) Take 1 tablet by mouth daily.     [provider]   Social History   Socioeconomic History   Marital status: Married    Spouse name: Not on file   Number of children: 0   Years of education: Not on file   Highest education level: Not on file  Occupational History   Occupation: Singer/songwriter    Employer: [employer]  Tobacco Use   Smoking status: Never   Smokeless tobacco: Never  Substance and Sexual Activity   Alcohol use: Not Currently   Drug use: No   Sexual activity: Yes    Partners: Male    Birth  control/protection: None  Other Topics Concern   Not on file  Social History Narrative   Not on file   Social Determinants of Health   Financial Resource Strain: Not on file  Food Insecurity: Not on file  Transportation Needs: Not on file  Physical Activity: Not on file  Stress: Not on file  Social Connections: Not on file  Intimate Partner Violence: Not on file    Review of Systems Per HPI.   Objective:   Vitals:   09/25/20 0915  BP: 114/66  Pulse: (!) 55  Resp: 15  Temp: 98.1 F (36.7 C)  TempSrc: Temporal  SpO2: 99%  Weight: 147 lb 3.2 oz (66.8 kg)  Height: 5\' 6"  (1.676 m)     Physical Exam Constitutional:      General: She is not in acute distress.    Appearance: Normal appearance. She is well-developed.  HENT:     Head: Normocephalic and atraumatic.  Neck:     Comments: No thyromegaly.  Cardiovascular:     Rate and Rhythm: Normal rate and regular rhythm.  Pulmonary:     Effort: Pulmonary effort is normal.     Breath sounds: Normal breath sounds.  Skin:    General: Skin is warm and dry.  Neurological:     Mental Status: She is alert and oriented to person, place, and time.  Psychiatric:        Mood and Affect: Mood normal.        Behavior: Behavior normal.        Thought Content: Thought content normal.       Assessment & Plan:  Lori Rice is a 35 y.o. female . Depression with anxiety - Plan: escitalopram (LEXAPRO) 10 MG tablet  -Well-controlled on Lexapro, continue same.  We will be searching for new therapist, additional phone numbers provided.  Recheck 6 months, sooner if needed.  Meds ordered this encounter  Medications   escitalopram (LEXAPRO) 10 MG tablet    Sig: Take 1 tablet (10 mg total) by mouth daily.    Dispense:  90 tablet    Refill:  2    Patient Instructions  Thanks for coming in today.  Continue Lexapro same dose for now.  Here are a few other therapist offices if you need them.  Recheck in 6 months but please let me  know if there are questions sooner.  Here are a few options for counseling:  [facility]:  [phone]  [facility] [phone]    Signed,   [phone], MD [facility], [facility] 09/25/20 10:02 AM

## 2020-09-25 NOTE — Patient Instructions (Signed)
Thanks for coming in today.  Continue Lexapro same dose for now.  Here are a few other therapist offices if you need them.  Recheck in 6 months but please let me know if there are questions sooner.  Here are a few options for counseling:  [facility]:  [phone]  [facility] [phone]

## 2020-12-26 DIAGNOSIS — G43719 Chronic migraine without aura, intractable, without status migrainosus: Secondary | ICD-10-CM | POA: Diagnosis not present

## 2021-01-20 DIAGNOSIS — G43719 Chronic migraine without aura, intractable, without status migrainosus: Secondary | ICD-10-CM | POA: Diagnosis not present

## 2021-02-13 DIAGNOSIS — D224 Melanocytic nevi of scalp and neck: Secondary | ICD-10-CM | POA: Diagnosis not present

## 2021-02-13 DIAGNOSIS — D485 Neoplasm of uncertain behavior of skin: Secondary | ICD-10-CM | POA: Diagnosis not present

## 2021-02-21 DIAGNOSIS — Z01419 Encounter for gynecological examination (general) (routine) without abnormal findings: Secondary | ICD-10-CM | POA: Diagnosis not present

## 2021-02-21 DIAGNOSIS — Z6823 Body mass index (BMI) 23.0-23.9, adult: Secondary | ICD-10-CM | POA: Diagnosis not present

## 2021-03-20 DIAGNOSIS — Z1231 Encounter for screening mammogram for malignant neoplasm of breast: Secondary | ICD-10-CM | POA: Diagnosis not present

## 2021-03-20 DIAGNOSIS — Z801 Family history of malignant neoplasm of trachea, bronchus and lung: Secondary | ICD-10-CM | POA: Diagnosis not present

## 2021-03-20 DIAGNOSIS — Z8052 Family history of malignant neoplasm of bladder: Secondary | ICD-10-CM | POA: Diagnosis not present

## 2021-03-20 DIAGNOSIS — Z803 Family history of malignant neoplasm of breast: Secondary | ICD-10-CM | POA: Diagnosis not present

## 2021-03-20 LAB — HM MAMMOGRAPHY

## 2021-03-28 ENCOUNTER — Encounter: Payer: Self-pay | Admitting: Family Medicine

## 2021-03-28 ENCOUNTER — Ambulatory Visit (INDEPENDENT_AMBULATORY_CARE_PROVIDER_SITE_OTHER): Payer: BC Managed Care – PPO | Admitting: Family Medicine

## 2021-03-28 VITALS — BP 110/70 | HR 57 | Temp 98.2°F | Resp 16 | Ht 66.0 in | Wt 150.8 lb

## 2021-03-28 DIAGNOSIS — F418 Other specified anxiety disorders: Secondary | ICD-10-CM | POA: Diagnosis not present

## 2021-03-28 DIAGNOSIS — Z23 Encounter for immunization: Secondary | ICD-10-CM

## 2021-03-28 MED ORDER — ESCITALOPRAM OXALATE 10 MG PO TABS: 10.0000 mg | ORAL_TABLET | Freq: Every day | ORAL | 2 refills | Status: DC

## 2021-03-28 NOTE — Progress Notes (Signed)
Subjective:  Patient ID: Lori Rice, female    DOB: May 31, 1985  Age: 36 y.o. MRN: [id number]  CC:  Chief Complaint  Patient presents with   Depression    Pt reports the low dose lexapro has been very helpful in managing her sxs, reports works well without negative impact     HPI Lori Rice presents for   Depression: With anxiety. See prior notes.  Did not tolerate side effects of Cymbalta.  Previous online psychiatric provider after birth of her daughter.  Lexapro 10 mg daily with hydroxyzine if needed but was not requiring meds.  Lexapro working well at her July 2022 visit.  Was planning on finding a therapist.   Still working well at current dose lexapro.  On Emgality for migraine- working well. Dr.Freeman at [facility].  Dtr. [name] is doing well - 15 months. Enjoying her and things going well at home.  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 Folk rock/americana.  Had mammogram and planned genetic screening with FH of breast CA in mom. Considering trying for 2nd child later in year.   GAD 7 : Generalized Anxiety Score 03/28/2021 09/25/2020 06/15/2019  Nervous, Anxious, on Edge 1 1 1   Control/stop worrying 1 1 1   Worry too much - different things 1 1 1   Trouble relaxing 1 1 1   Restless 1 0 0  Easily annoyed or irritable 1 0 1  Afraid - awful might happen 0 0 1  Total GAD 7 Score 6 4 6       Depression screen [facility] 2/9 03/28/2021 09/25/2020 06/15/2019 05/16/2018 09/01/2016  Decreased Interest 0 0 0 0 0  Down, Depressed, Hopeless 0 0 0 0 0  PHQ - 2 Score 0 0 0 0 0  Altered sleeping 0 0 - - -  Tired, decreased energy 0 0 - - -  Change in appetite 0 0 - - -  Feeling bad or failure about yourself  0 0 - - -  Trouble concentrating 0 0 - - -  Moving slowly or fidgety/restless 0 0 - - -  Suicidal thoughts 0 0 - - -  PHQ-9 Score 0 0 - - -    History Patient Active Problem List   Diagnosis Date Noted   Encounter for induction of labor  12/15/2019   Incomplete miscarriage 02/26/2019   Vaginal bleeding affecting early pregnancy 02/26/2019   Encounter for procreative genetic counseling 11/08/2018   Situational anxiety 11/08/2015   Palpitations 06/19/2013   Dyspnea 06/19/2013   Retrocecal appendicitis 07/21/2012   Past Medical History:  Diagnosis Date   Anxiety    Bradycardia    Depression    Migraines    Mononucleosis    Palpitations    Tachycardia    Past Surgical History:  Procedure Laterality Date   CESAREAN SECTION N/A 12/16/2019   Procedure: CESAREAN SECTION;  Surgeon: 01/08/2016, MD;  Location: [facility];  Service: Obstetrics;  Laterality: N/A;   LAPAROSCOPIC APPENDECTOMY N/A 07/21/2012   Procedure: APPENDECTOMY LAPAROSCOPIC;  Surgeon: 06/21/2013, MD;  Location: [facility];  Service: General;  Laterality: N/A;   TONSILLECTOMY     TONSILLECTOMY     No Known Allergies Prior to Admission medications   Medication Sig Start Date End Date Taking? Authorizing Provider  escitalopram (LEXAPRO) 10 MG tablet Take 1 tablet (10 mg total) by mouth daily. 09/25/20  Yes 12/18/2019, MD  Galcanezumab-gnlm [facility]) 120 MG/ML SOAJ Inject 1  mL as directed every 30 (thirty) days.   Yes [provider]   Social History   Socioeconomic History   Marital status: Married    Spouse name: Not on file   Number of children: 0   Years of education: Not on file   Highest education level: Not on file  Occupational History   Occupation: Singer/songwriter    Employer: [employer]  Tobacco Use   Smoking status: Never   Smokeless tobacco: Never  Substance and Sexual Activity   Alcohol use: Not Currently   Drug use: No   Sexual activity: Yes    Partners: Male    Birth control/protection: None  Other Topics Concern   Not on file  Social History Narrative   Not on file   Social Determinants of Health   Financial Resource Strain: Not on file  Food Insecurity: Not on file  Transportation Needs:  Not on file  Physical Activity: Not on file  Stress: Not on file  Social Connections: Not on file  Intimate Partner Violence: Not on file    Review of Systems  Per HPI.  Objective:   Vitals:   03/28/21 1011  BP: 110/70  Pulse: (!) 57  Resp: 16  Temp: 98.2 F (36.8 C)  TempSrc: Temporal  SpO2: 97%  Weight: 150 lb 12.8 oz (68.4 kg)  Height: 5\' 6"  (1.676 m)    Physical Exam Constitutional:      General: She is not in acute distress.    Appearance: Normal appearance. She is well-developed.  HENT:     Head: Normocephalic and atraumatic.  Cardiovascular:     Rate and Rhythm: Normal rate.  Pulmonary:     Effort: Pulmonary effort is normal.  Neurological:     Mental Status: She is alert and oriented to person, place, and time.  Psychiatric:        Mood and Affect: Mood normal.       Assessment & Plan:  Lori Rice is a 36 y.o. female . Depression with anxiety - Plan: escitalopram (LEXAPRO) 10 MG tablet  -Well-controlled on Lexapro, decided to remain on same dose.  We will follow-up in 6 months.  May be trying to conceive later in the year, but has been discussed with her GYN and likely would continue SSRI.  Need for influenza vaccination - Plan: Flu Vaccine QUAD 6+ mos PF IM (Fluarix Quad PF)   Meds ordered this encounter  Medications   escitalopram (LEXAPRO) 10 MG tablet    Sig: Take 1 tablet (10 mg total) by mouth daily.    Dispense:  90 tablet    Refill:  2   Patient Instructions  No med changes at this time. Let me know if there are med questions.  Thanks for coming in today.     Signed,   34, MD [facility], [facility] 03/28/21 12:24 PM

## 2021-03-28 NOTE — Patient Instructions (Addendum)
No med changes at this time. Let me know if there are med questions.  Thanks for coming in today.

## 2021-04-22 DIAGNOSIS — G43719 Chronic migraine without aura, intractable, without status migrainosus: Secondary | ICD-10-CM | POA: Diagnosis not present

## 2021-05-01 DIAGNOSIS — Z809 Family history of malignant neoplasm, unspecified: Secondary | ICD-10-CM | POA: Diagnosis not present

## 2021-05-01 DIAGNOSIS — Z9189 Other specified personal risk factors, not elsewhere classified: Secondary | ICD-10-CM | POA: Diagnosis not present

## 2021-05-07 ENCOUNTER — Ambulatory Visit (INDEPENDENT_AMBULATORY_CARE_PROVIDER_SITE_OTHER): Payer: BC Managed Care – PPO | Admitting: Registered Nurse

## 2021-05-07 ENCOUNTER — Encounter: Payer: Self-pay | Admitting: Registered Nurse

## 2021-05-07 VITALS — BP 101/72 | HR 74 | Temp 98.0°F | Resp 18 | Ht 66.0 in | Wt 142.6 lb

## 2021-05-07 DIAGNOSIS — A09 Infectious gastroenteritis and colitis, unspecified: Secondary | ICD-10-CM | POA: Diagnosis not present

## 2021-05-07 MED ORDER — AZITHROMYCIN 500 MG PO TABS: 1000.0000 mg | ORAL_TABLET | Freq: Once | ORAL | 0 refills | Status: AC

## 2021-05-07 MED ORDER — DIPHENOXYLATE-ATROPINE 2.5-0.025 MG PO TABS: 1.0000 | ORAL_TABLET | Freq: Four times a day (QID) | ORAL | 0 refills | Status: DC | PRN

## 2021-05-07 NOTE — Patient Instructions (Addendum)
Lori Rice -  ? ?Great to meet you ? ?Take azithromycin single dose. Use lomotil as instructed as needed. ? ?Stool sample will take a few days to come back. Cbc and cmp should be back this afternoon ? ?Call with any concerns, worsening, or failure to improve ? ?Thanks, ? ?Rich  ? ? ? ?If you have lab work done today you will be contacted with your lab results within the next 2 weeks.  If you have not heard from [place] then please contact us. The fastest way to get your results is to register for My Chart. ? ? ?IF you received an x-ray today, you will receive an invoice from [facility]. Please contact [facility] at [phone] with questions or concerns regarding your invoice.  ? ?IF you received labwork today, you will receive an invoice from [place]. Please contact [facility] at [phone] with questions or concerns regarding your invoice.  ? ?Our billing staff will not be able to assist you with questions regarding bills from these companies. ? ?You will be contacted with the lab results as soon as they are available. The fastest way to get your results is to activate your My Chart account. Instructions are located on the last page of this paperwork. If you have not heard from [place] regarding the results in 2 weeks, please contact this office. ?  ? ? ?

## 2021-05-07 NOTE — Progress Notes (Signed)
? ?Established Patient Office Visit ? ?Subjective:  ?Patient ID: Lori Rice, female    DOB: [birth year]  Age: 36 y.o. MRN: [id number] ? ?CC:  ?Chief Complaint  ?Patient presents with  ? Diarrhea  ?  Patient states she has been having some diarrhea and been very fatigue since Saturday. Patient states she went to the bathroom 5 times last night and has not been able to eat as much because of no appetite   ? ? ?HPI ?Lori Rice presents for diarrhea. ? ?Onset Sunday. No instigating event. ?No sick contacts. ?Notes yellow/green light color stool.  ?Notes one instance of scant amount of brbpr. No melena.  ?No vomiting. No abdominal pain.  ?Appetite has been poor. Only at a bagel with a small bit of cream cheese ?Drinking gatorade zero.  ? ?Notes similar episode in 2018. [place] [facility] [facility] [place].  ?Given Hyoscyamine and zofran with good effect. Did not have microbiology testing at that time. ? ?She is mother to a toddler. Toddler, husband, nanny not ill. ? ? ?Past Medical History:  ?Diagnosis Date  ? Anxiety   ? Bradycardia   ? Depression   ? Migraines   ? Mononucleosis   ? Palpitations   ? Tachycardia   ? ? ?Past Surgical History:  ?Procedure Laterality Date  ? CESAREAN SECTION N/A 12/16/2019  ? Procedure: CESAREAN SECTION;  Surgeon: Candice CampLowe, David, MD;  [facility];  Service: Obstetrics;  Laterality: N/A;  ? LAPAROSCOPIC APPENDECTOMY N/A 07/21/2012  ? Procedure: APPENDECTOMY LAPAROSCOPIC;  Surgeon: Currie Parishristian J Streck, MD;  Location: [facility];  Service: General;  Laterality: N/A;  ? TONSILLECTOMY    ? TONSILLECTOMY    ? ? ?Family History  ?Problem Relation Age of Onset  ? Breast cancer Mother   ? Lung cancer Paternal Grandfather   ? Heart disease Paternal Grandfather   ? Hypertension Maternal Grandmother   ? Bladder Cancer Maternal Grandfather   ? Hypertension Maternal Grandfather   ? Stroke Maternal Grandfather   ? Heart disease Maternal Grandfather   ? Heart failure Maternal Grandfather   ? Breast  cancer Paternal Grandmother   ? ? ?Social History  ? ?Socioeconomic History  ? Marital status: Married  ?  Spouse name: Not on file  ? Number of children: 0  ? Years of education: Not on file  ? Highest education level: Not on file  ?Occupational History  ? Occupation: Singer/songwriter  ?  Employer: [employer]  ?Tobacco Use  ? Smoking status: Never  ? Smokeless tobacco: Never  ?Substance and Sexual Activity  ? Alcohol use: Not Currently  ? Drug use: No  ? Sexual activity: Yes  ?  Partners: Male  ?  Birth control/protection: None  ?Other Topics Concern  ? Not on file  ?Social History Narrative  ? Not on file  ? ?Social Determinants of Health  ? ?Financial Resource Strain: Not on file  ?Food Insecurity: Not on file  ?Transportation Needs: Not on file  ?Physical Activity: Not on file  ?Stress: Not on file  ?Social Connections: Not on file  ?Intimate Partner Violence: Not on file  ? ? ?Outpatient Medications Prior to Visit  ?Medication Sig Dispense Refill  ? escitalopram (LEXAPRO) 10 MG tablet Take 1 tablet (10 mg total) by mouth daily. 90 tablet 2  ? Galcanezumab-gnlm (EMGALITY) 120 MG/ML SOAJ Inject 1 mL as directed every 30 (thirty) days.    ? ?No facility-administered medications prior to visit.  ? ? ?No  Known Allergies ? ?ROS ?Review of Systems  ?Constitutional: Negative.   ?HENT: Negative.    ?Eyes: Negative.   ?Respiratory: Negative.    ?Cardiovascular: Negative.   ?Gastrointestinal:  Positive for diarrhea. Negative for abdominal distention, abdominal pain, anal bleeding, blood in stool, constipation, nausea, rectal pain and vomiting.  ?Endocrine: Negative.   ?Genitourinary: Negative.   ?Musculoskeletal: Negative.   ?Skin: Negative.   ?Neurological: Negative.   ?Psychiatric/Behavioral: Negative.    ?All other systems reviewed and are negative. ? ?  ?Objective:  ?  ?Physical Exam ?Vitals and nursing note reviewed.  ?Constitutional:   ?   General: She is not in acute distress. ?   Appearance: Normal  appearance. She is normal weight. She is not ill-appearing, toxic-appearing or diaphoretic.  ?Cardiovascular:  ?   Rate and Rhythm: Normal rate and regular rhythm.  ?   Heart sounds: Normal heart sounds. No murmur heard. ?  No friction rub. No gallop.  ?Pulmonary:  ?   Effort: Pulmonary effort is normal. No respiratory distress.  ?   Breath sounds: Normal breath sounds. No stridor. No wheezing, rhonchi or rales.  ?Chest:  ?   Chest wall: No tenderness.  ?Abdominal:  ?   General: Abdomen is flat. Bowel sounds are normal. There is no distension.  ?   Palpations: There is no mass.  ?   Tenderness: There is no abdominal tenderness.  ?Skin: ?   General: Skin is warm and dry.  ?Neurological:  ?   General: No focal deficit present.  ?   Mental Status: She is alert and oriented to person, place, and time. Mental status is at baseline.  ?Psychiatric:     ?   Mood and Affect: Mood normal.     ?   Behavior: Behavior normal.     ?   Thought Content: Thought content normal.     ?   Judgment: Judgment normal.  ? ? ?BP 101/72   Pulse 74   Temp 98 ?F (36.7 ?C) (Temporal)   Resp 18   Ht 5\' 6"  (1.676 m)   Wt 142 lb 9.6 oz (64.7 kg)   LMP 04/14/2021   SpO2 99%   BMI 23.02 kg/m?  ?Wt Readings from Last 3 Encounters:  ?05/07/21 142 lb 9.6 oz (64.7 kg)  ?03/28/21 150 lb 12.8 oz (68.4 kg)  ?09/25/20 147 lb 3.2 oz (66.8 kg)  ? ? ? ?There are no preventive care reminders to display for this patient. ? ?There are no preventive care reminders to display for this patient. ? ?Lab Results  ?Component Value Date  ? TSH 0.88 06/19/2013  ? ?Lab Results  ?Component Value Date  ? WBC 20.4 (H) 12/17/2019  ? HGB 10.9 (L) 12/17/2019  ? HCT 31.7 (L) 12/17/2019  ? MCV 88.8 12/17/2019  ? PLT 141 (L) 12/17/2019  ? ?Lab Results  ?Component Value Date  ? NA 141 03/23/2016  ? K 4.4 03/23/2016  ? CO2 23 03/23/2016  ? GLUCOSE 86 03/23/2016  ? BUN 13 03/23/2016  ? CREATININE 0.82 03/23/2016  ? BILITOT 0.4 07/21/2012  ? ALKPHOS 80 07/21/2012  ? AST 25  07/21/2012  ? ALT 23 07/21/2012  ? PROT 7.0 07/21/2012  ? ALBUMIN 4.0 07/21/2012  ? CALCIUM 8.9 03/23/2016  ? GFR 87.57 06/19/2013  ? ?No results found for: CHOL ?No results found for: HDL ?No results found for: LDLCALC ?No results found for: TRIG ?No results found for: CHOLHDL ?No results found for: HGBA1C ? ?  ?  Assessment & Plan:  ? ?Problem List Items Addressed This Visit   ?None ?Visit Diagnoses   ? ? Diarrhea, infectious, adult    -  Primary  ? Relevant Medications  ? azithromycin (ZITHROMAX) 500 MG tablet  ? diphenoxylate-atropine (LOMOTIL) 2.5-0.025 MG tablet  ? Other Relevant Orders  ? Clostridium Difficile by PCR  ? Stool culture  ? CBC with Differential/Platelet  ? Comprehensive metabolic panel  ? ?  ? ? ?Meds ordered this encounter  ?Medications  ? azithromycin (ZITHROMAX) 500 MG tablet  ?  Sig: Take 2 tablets (1,000 mg total) by mouth once for 1 dose.  ?  Dispense:  2 tablet  ?  Refill:  0  ?  Order Specific Question:   Supervising Provider  ?  Answer:   Neva Seat, JEFFREY R [[id number]]  ? diphenoxylate-atropine (LOMOTIL) 2.5-0.025 MG tablet  ?  Sig: Take 1 tablet by mouth 4 (four) times daily as needed for diarrhea or loose stools.  ?  Dispense:  30 tablet  ?  Refill:  0  ?  Order Specific Question:   Supervising Provider  ?  Answer:   Neva Seat, JEFFREY R [[id number]]  ? ? ?Follow-up: Return if symptoms worsen or fail to improve.  ? ?PLAN ?Diarrhea of presumed infectious origin. Will give lomotil, azithromycin single dose.  ?Stool culture ordered. Cbc and cmp obtained. Follow up as indicated ?Patient encouraged to call clinic with any questions, comments, or concerns. ? ? ?Janeece Agee, NP ?

## 2021-05-07 NOTE — Addendum Note (Signed)
Addended by: Janeece Agee on: 05/07/2021 10:53 AM ? ? Modules accepted: Orders ? ?

## 2021-05-08 DIAGNOSIS — A09 Infectious gastroenteritis and colitis, unspecified: Secondary | ICD-10-CM | POA: Diagnosis not present

## 2021-05-08 LAB — COMPREHENSIVE METABOLIC PANEL
ALT: 14 U/L (ref 0–35)
AST: 17 U/L (ref 0–37)
Albumin: 4.7 g/dL (ref 3.5–5.2)
Alkaline Phosphatase: 55 U/L (ref 39–117)
BUN: 10 mg/dL (ref 6–23)
CO2: 22 mEq/L (ref 19–32)
Calcium: 9 mg/dL (ref 8.4–10.5)
Chloride: 107 mEq/L (ref 96–112)
Creatinine, Ser: 0.93 mg/dL (ref 0.40–1.20)
GFR: 80.93 mL/min (ref >60.00)
Glucose, Bld: 61 mg/dL — ABNORMAL LOW (ref 70–99)
Potassium: 3.5 mEq/L (ref 3.5–5.1)
Sodium: 138 mEq/L (ref 135–145)
Total Bilirubin: 0.6 mg/dL (ref 0.2–1.2)
Total Protein: 7.2 g/dL (ref 6.0–8.3)

## 2021-05-08 LAB — CBC WITH DIFFERENTIAL/PLATELET
Basophils Absolute: 0 10*3/uL (ref 0.0–0.1)
Basophils Relative: 0.9 % (ref 0.0–3.0)
Eosinophils Absolute: 0.1 10*3/uL (ref 0.0–0.7)
Eosinophils Relative: 2 % (ref 0.0–5.0)
HCT: 44.2 % (ref 36.0–46.0)
Hemoglobin: 14.8 g/dL (ref 12.0–15.0)
Lymphocytes Relative: 47.7 % — ABNORMAL HIGH (ref 12.0–46.0)
Lymphs Abs: 2.3 10*3/uL (ref 0.7–4.0)
MCHC: 33.6 g/dL (ref 30.0–36.0)
MCV: 86.8 fl (ref 78.0–100.0)
Monocytes Absolute: 0.6 10*3/uL (ref 0.1–1.0)
Monocytes Relative: 12.5 % — ABNORMAL HIGH (ref 3.0–12.0)
Neutro Abs: 1.8 10*3/uL (ref 1.4–7.7)
Neutrophils Relative %: 36.9 % — ABNORMAL LOW (ref 43.0–77.0)
Platelets: 214 10*3/uL (ref 150.0–400.0)
RBC: 5.1 Mil/uL (ref 3.87–5.11)
RDW: 12.9 % (ref 11.5–15.5)
WBC: 4.8 10*3/uL (ref 4.0–10.5)

## 2021-05-11 LAB — CLOSTRIDIUM DIFFICILE BY PCR: Toxigenic C. Difficile by PCR: NEGATIVE

## 2021-05-13 LAB — STOOL CULTURE: E coli, Shiga toxin Assay: NEGATIVE

## 2021-07-10 ENCOUNTER — Telehealth: Payer: Self-pay | Admitting: Family Medicine

## 2021-07-10 NOTE — Telephone Encounter (Signed)
Chief Complaint WHEEZING ?Reason for Call Request to Schedule Office Appointment ?Initial Comment Caller states they've had a cough for over a week. ?Tightness and sicomfort in chest, slight wheezing. ?Translation No ?Nurse Assessment ?Nurse: Konrad Dolores, RN, [place] Bl[place] Date/Time (Eastern Time): 07/09/2021 5:01:13 PM ?Confirm and document reason for call. If ?symptomatic, describe symptoms. ?---Caller states cough x 1 week, hoarseness, chest sore ?with cough, ?Does the patient have any new or worsening ?symptoms? ---Yes ?Will a triage be completed? ---Yes ?Related visit to physician within the last 2 weeks? ---No ?Does the PT have any chronic conditions? (i.e. ?diabetes, asthma, this includes High risk factors for ?pregnancy, etc.) ?---No ?Is the patient pregnant or possibly pregnant? (Ask ?all females between the ages of 83-55) ---No ?Is this a behavioral health or substance abuse call? ---No ?Guidelines ?Guideline Title Affirmed Question Affirmed Notes Nurse Date/Time (Eastern ?Time) ?Cough - Acute ?Productive Cough Konrad Dolores, RN, [place] 07/09/2021 5:03:01 ?PM ?Disp. Time (Eastern ?Time) Disposition Final User ?07/09/2021 4:57:35 PM Send to Urgent Olevia Bowens ?07/09/2021 5:10:31 PM Home Care Yes Konrad Dolores, RN, [place] Blas ?PLEASE NOTE: All timestamps contained within this report are represented as [place] Standard Time. ?CONFIDENTIALTY NOTICE: This fax transmission is intended only for the addressee. It contains information that is legally privileged, confidential or ?otherwise protected from use or disclosure. If you are not the intended recipient, you are strictly prohibited from reviewing, disclosing, copying using ?or disseminating any of this information or taking any action in reliance on or regarding this information. If you have received this fax in error, please ?notify us immediately by telephone so that we can arrange for its return to [place]. Phone: [phone], Toll-Free: [phone], Fax: [phone] ?Page: 2 of  2 ?Call Id: [id number] ?Caller Disagree/Comply Comply ?Caller Understands Yes ?PreDisposition Call Doctor ?Care Advice Given Per Guideline ?HOME CARE: * You should be able to treat this at home. * A viral upper respiratory infection (such as a cold or flu) causes a cough ?for 1 to 3 weeks. EXPECTED COURSE: * COUGH SYRUP WITH DEXTROMETHORPHAN: An over-the-counter cough syrup ?can help your cough. The most common cough suppressant in over-the-counter cough medicines is dextromethorphan. * WARNING: ?Do not take dextromethorphan if you are taking venlafaxine (Effexor). DRINK PLENTY OF LIQUIDS: * Drink plenty of liquids. ?CALL BACK IF: * Cough lasts over 3 weeks * Continuous coughing persists over 2 hours after cough treatment * Fever over 103 F ?(39.4 C) * Fever lasts over 3 days * You become worse CARE ADVICE given per Cough - Acute Productive (Adult) guideline ?

## 2021-07-29 ENCOUNTER — Telehealth: Payer: BC Managed Care – PPO | Admitting: Registered Nurse

## 2021-07-29 ENCOUNTER — Telehealth: Payer: Self-pay | Admitting: Family Medicine

## 2021-07-29 DIAGNOSIS — H109 Unspecified conjunctivitis: Secondary | ICD-10-CM | POA: Diagnosis not present

## 2021-07-29 MED ORDER — ERYTHROMYCIN 5 MG/GM OP OINT: 1.0000 "application " | TOPICAL_OINTMENT | Freq: Three times a day (TID) | OPHTHALMIC | 0 refills | Status: DC

## 2021-07-29 NOTE — Progress Notes (Signed)
Telemedicine Encounter- SOAP NOTE Established Patient  This video encounter was conducted with the patient's (or proxy's) verbal consent via audio telecommunications: yes/no: Yes Patient was instructed to have this encounter in a suitably private space; and to only have persons present to whom they give permission to participate. In addition, patient identity was confirmed by use of name plus two identifiers (DOB and address).  I discussed the limitations, risks, security and privacy concerns of performing an evaluation and management service by telephone and the availability of in person appointments. I also discussed with the patient that there may be a patient responsible charge related to this service. The patient expressed understanding and agreed to proceed.  I spent a total of 16 minutes talking with the patient or their proxy.  Patient at home Provider in office  Participants: Jari Sportsman, NP and Rozann Lesches  Chief Complaint  Patient presents with   Eye Problem    Patient states she woke up this morning her eye was closed shut and crusty she thinks that she has pink eye.    Subjective   Lori Rice is a 36 y.o. established patient. Video visit today for eye problem  HPI Itching last night Woke up with crusting shut, lots of watery eye.  Was able to wash with warm water and soap, but recurred.   No pain in orbit or with EOMs. No URI symptoms No trauma to eye.   Patient Active Problem List   Diagnosis Date Noted   Encounter for induction of labor 12/15/2019   Incomplete miscarriage 02/26/2019   Vaginal bleeding affecting early pregnancy 02/26/2019   Encounter for procreative genetic counseling 11/08/2018   Situational anxiety 11/08/2015   Palpitations 06/19/2013   Dyspnea 06/19/2013   Retrocecal appendicitis 07/21/2012    Past Medical History:  Diagnosis Date   Anxiety    Bradycardia    Depression    Migraines    Mononucleosis    Palpitations     Tachycardia     Current Outpatient Medications  Medication Sig Dispense Refill   erythromycin ophthalmic ointment Place 1 application. into both eyes 3 (three) times daily. 17.5 g 0   escitalopram (LEXAPRO) 10 MG tablet Take 1 tablet (10 mg total) by mouth daily. 90 tablet 2   Galcanezumab-gnlm (EMGALITY) 120 MG/ML SOAJ Inject 1 mL as directed every 30 (thirty) days.     topiramate (TOPAMAX) 100 MG tablet      diphenoxylate-atropine (LOMOTIL) 2.5-0.025 MG tablet Take 1 tablet by mouth 4 (four) times daily as needed for diarrhea or loose stools. 30 tablet 0   rizatriptan (MAXALT) 10 MG tablet rizatriptan 10 mg tablet  1 TAB AS NEEDED MIGRAINE MAY REPEAT ONCE AFTER 2 HOURS. LIMIT 1-2 TREATMENTS PER WEEK     topiramate (TOPAMAX) 50 MG tablet topiramate 50 mg tablet  TAKE 3 TABLETS BY MOUTH EVERY DAY     No current facility-administered medications for this visit.    No Known Allergies  Social History   Socioeconomic History   Marital status: Married    Spouse name: Not on file   Number of children: 0   Years of education: Not on file   Highest education level: Not on file  Occupational History   Occupation: Singer/songwriter    Employer: [employer]  Tobacco Use   Smoking status: Never   Smokeless tobacco: Never  Substance and Sexual Activity   Alcohol use: Not Currently   Drug use: No   Sexual activity: Yes  Partners: Male    Birth control/protection: None  Other Topics Concern   Not on file  Social History Narrative   Not on file   Social Determinants of Health   Financial Resource Strain: Not on file  Food Insecurity: Not on file  Transportation Needs: Not on file  Physical Activity: Not on file  Stress: Not on file  Social Connections: Not on file  Intimate Partner Violence: Not on file    ROS Per hpi   Objective  On video eye appears red, minimally swollen. EOMs intact.  Vitals as reported by the patient: There were no vitals filed for this  visit.  Lori Rice was seen today for eye problem.  Diagnoses and all orders for this visit:  Bacterial conjunctivitis of left eye -     erythromycin ophthalmic ointment; Place 1 application. into both eyes 3 (three) times daily.    PLAN Appears as bacterial conjunctivitis based on history and limited visual exam Tx with erythromycin ointment tid  Return if worsening or failing to improve Patient encouraged to call clinic with any questions, comments, or concerns.   I discussed the assessment and treatment plan with the patient. The patient was provided an opportunity to ask questions and all were answered. The patient agreed with the plan and demonstrated an understanding of the instructions.   The patient was advised to call back or seek an in-person evaluation if the symptoms worsen or if the condition fails to improve as anticipated.  I provided 13 minutes of face-to-face time during this encounter.  Lori Agee, NP

## 2021-07-29 NOTE — Patient Instructions (Signed)
° ° ° °  If you have lab work done today you will be contacted with your lab results within the next 2 weeks.  If you have not heard from us then please contact us. The fastest way to get your results is to register for My Chart. ° ° °IF you received an x-ray today, you will receive an invoice from [facility]. Please contact [facility] at [phone] with questions or concerns regarding your invoice.  ° °IF you received labwork today, you will receive an invoice from [facility]. Please contact [facility] at [phone] with questions or concerns regarding your invoice.  ° °Our billing staff will not be able to assist you with questions regarding bills from these companies. ° °You will be contacted with the lab results as soon as they are available. The fastest way to get your results is to activate your My Chart account. Instructions are located on the last page of this paperwork. If you have not heard from us regarding the results in 2 weeks, please contact this office. °  ° ° ° °

## 2021-09-03 DIAGNOSIS — G43719 Chronic migraine without aura, intractable, without status migrainosus: Secondary | ICD-10-CM | POA: Diagnosis not present

## 2021-09-09 DIAGNOSIS — Z79899 Other long term (current) drug therapy: Secondary | ICD-10-CM | POA: Diagnosis not present

## 2021-09-09 DIAGNOSIS — F902 Attention-deficit hyperactivity disorder, combined type: Secondary | ICD-10-CM | POA: Diagnosis not present

## 2021-09-09 DIAGNOSIS — R41840 Attention and concentration deficit: Secondary | ICD-10-CM | POA: Diagnosis not present

## 2021-09-09 DIAGNOSIS — F419 Anxiety disorder, unspecified: Secondary | ICD-10-CM | POA: Diagnosis not present

## 2021-09-25 ENCOUNTER — Encounter: Payer: Self-pay | Admitting: Family Medicine

## 2021-09-25 ENCOUNTER — Ambulatory Visit (INDEPENDENT_AMBULATORY_CARE_PROVIDER_SITE_OTHER): Payer: BC Managed Care – PPO | Admitting: Family Medicine

## 2021-09-25 VITALS — BP 128/74 | HR 54 | Temp 98.8°F | Resp 16 | Ht 66.0 in | Wt 144.4 lb

## 2021-09-25 DIAGNOSIS — Z131 Encounter for screening for diabetes mellitus: Secondary | ICD-10-CM

## 2021-09-25 DIAGNOSIS — Z3200 Encounter for pregnancy test, result unknown: Secondary | ICD-10-CM

## 2021-09-25 DIAGNOSIS — Z0000 Encounter for general adult medical examination without abnormal findings: Secondary | ICD-10-CM

## 2021-09-25 DIAGNOSIS — Z13220 Encounter for screening for lipoid disorders: Secondary | ICD-10-CM | POA: Diagnosis not present

## 2021-09-25 LAB — COMPREHENSIVE METABOLIC PANEL
ALT: 12 U/L (ref 0–35)
AST: 18 U/L (ref 0–37)
Albumin: 4.7 g/dL (ref 3.5–5.2)
Alkaline Phosphatase: 48 U/L (ref 39–117)
BUN: 11 mg/dL (ref 6–23)
CO2: 26 mEq/L (ref 19–32)
Calcium: 9.5 mg/dL (ref 8.4–10.5)
Chloride: 104 mEq/L (ref 96–112)
Creatinine, Ser: 0.8 mg/dL (ref 0.40–1.20)
GFR: 96.69 mL/min (ref >60.00)
Glucose, Bld: 84 mg/dL (ref 70–99)
Potassium: 4.3 mEq/L (ref 3.5–5.1)
Sodium: 137 mEq/L (ref 135–145)
Total Bilirubin: 0.5 mg/dL (ref 0.2–1.2)
Total Protein: 7.4 g/dL (ref 6.0–8.3)

## 2021-09-25 LAB — LIPID PANEL
Cholesterol: 150 mg/dL (ref 0–200)
HDL: 54.6 mg/dL (ref >39.00)
LDL Cholesterol: 86 mg/dL (ref 0–99)
NonHDL: 95.02
Total CHOL/HDL Ratio: 3
Triglycerides: 46 mg/dL (ref 0.0–149.0)
VLDL: 9.2 mg/dL (ref 0.0–40.0)

## 2021-09-25 LAB — HCG, QUANTITATIVE, PREGNANCY: Quantitative HCG: <0.6 m[IU]/mL

## 2021-09-25 LAB — HEMOGLOBIN A1C: Hgb A1c MFr Bld: 5.3 % (ref 4.6–6.5)

## 2021-09-25 NOTE — Patient Instructions (Addendum)
I will let you know if there are any concerns in your labs.  Continue follow-up with attention specialist and GYN.  It would be early but I did check your urine test for pregnancy today and we will let you know results.  Certainly can check that with urine testing at home as well. Thanks for coming in and take care!  Preventive Care 25-34 Years Old, Female Preventive care refers to lifestyle choices and visits with your health care provider that can promote health and wellness. Preventive care visits are also called wellness exams. What can I expect for my preventive care visit? Counseling During your preventive care visit, your health care provider may ask about your: Medical history, including: Past medical problems. Family medical history. Pregnancy history. Current health, including: Menstrual cycle. Method of birth control. Emotional well-being. Home life and relationship well-being. Sexual activity and sexual health. Lifestyle, including: Alcohol, nicotine or tobacco, and drug use. Access to firearms. Diet, exercise, and sleep habits. Work and work Statistician. Sunscreen use. Safety issues such as seatbelt and bike helmet use. Physical exam Your health care provider may check your: Height and weight. These may be used to calculate your BMI (body mass index). BMI is a measurement that tells if you are at a healthy weight. Waist circumference. This measures the distance around your waistline. This measurement also tells if you are at a healthy weight and may help predict your risk of certain diseases, such as type 2 diabetes and high blood pressure. Heart rate and blood pressure. Body temperature. Skin for abnormal spots. What immunizations do I need?  Vaccines are usually given at various ages, according to a schedule. Your health care provider will recommend vaccines for you based on your age, medical history, and lifestyle or other factors, such as travel or where you  work. What tests do I need? Screening Your health care provider may recommend screening tests for certain conditions. This may include: Pelvic exam and Pap test. Lipid and cholesterol levels. Diabetes screening. This is done by checking your blood sugar (glucose) after you have not eaten for a while (fasting). Hepatitis B test. Hepatitis C test. HIV (human immunodeficiency virus) test. STI (sexually transmitted infection) testing, if you are at risk. BRCA-related cancer screening. This may be done if you have a family history of breast, ovarian, tubal, or peritoneal cancers. Talk with your health care provider about your test results, treatment options, and if necessary, the need for more tests. Follow these instructions at home: Eating and drinking  Eat a healthy diet that includes fresh fruits and vegetables, whole grains, lean protein, and low-fat dairy products. Take vitamin and mineral supplements as recommended by your health care provider. Do not drink alcohol if: Your health care provider tells you not to drink. You are pregnant, may be pregnant, or are planning to become pregnant. If you drink alcohol: Limit how much you have to 0-1 drink a day. Know how much alcohol is in your drink. In the [place] one drink equals one 12 oz bottle of beer (355 mL), one 5 oz glass of wine (148 mL), or one 1 oz glass of hard liquor (44 mL). Lifestyle Brush your teeth every morning and night with fluoride toothpaste. Floss one time each day. Exercise for at least 30 minutes 5 or more days each week. Do not use any products that contain nicotine or tobacco. These products include cigarettes, chewing tobacco, and vaping devices, such as e-cigarettes. If you need help quitting, ask your health  care provider. Do not use drugs. If you are sexually active, practice safe sex. Use a condom or other form of protection to prevent STIs. If you do not wish to become pregnant, use a form of birth control. If  you plan to become pregnant, see your health care provider for a prepregnancy visit. Find healthy ways to manage stress, such as: Meditation, yoga, or listening to music. Journaling. Talking to a trusted person. Spending time with friends and family. Minimize exposure to UV radiation to reduce your risk of skin cancer. Safety Always wear your seat belt while driving or riding in a vehicle. Do not drive: If you have been drinking alcohol. Do not ride with someone who has been drinking. If you have been using any mind-altering substances or drugs. While texting. When you are tired or distracted. Wear a helmet and other protective equipment during sports activities. If you have firearms in your house, make sure you follow all gun safety procedures. Seek help if you have been physically or sexually abused. What's next? Go to your health care provider once a year for an annual wellness visit. Ask your health care provider how often you should have your eyes and teeth checked. Stay up to date on all vaccines. This information is not intended to replace advice given to you by your health care provider. Make sure you discuss any questions you have with your health care provider. Document Revised: 08/14/2020 Document Reviewed: 08/14/2020 Elsevier Patient Education  [place].

## 2021-09-25 NOTE — Progress Notes (Signed)
Subjective:  Patient ID: Lori Rice, female    DOB: 18-Apr-1985  Age: 36 y.o. MRN: [id number]  CC:  Chief Complaint  Patient presents with   Annual Exam    Was diagnosed with ADHD as discussed previously but specialist is handling medications, is trying to get pregnant with baby #2 no concerns just wanted to update PCP     HPI Lori Rice presents for Annual Exam  Care team: PCP: me GYN: Noel Gerold Attention Specialist eval few weeks ago, diagnosed with ADD, started on Adzenys. Follow up 8/2. Feels better on meds. Able to get more done. Minimal anxiety increase, overall still well controlled - would like to stay on Lexapro same dose for now.  Doing well, trying to conceive 2nd child.  LMP 7/9-13. Has not checked urine HCG.  Other dtr Lori Rice is almost 36 yo. She is doing well. She will need glasses.  Neuro: Dr. Domingo Cocking, for migarine HA, tapered off topamax for now d/t plan for conceiving. Avoiding eletriptan.      09/25/2021    2:24 PM 07/29/2021   12:57 PM 03/28/2021   10:17 AM 09/25/2020    9:18 AM 06/15/2019   11:19 AM  Depression screen PHQ 2/9  Decreased Interest 0 0 0 0 0  Down, Depressed, Hopeless 0 0 0 0 0  PHQ - 2 Score 0 0 0 0 0  Altered sleeping 1 0 0 0   Tired, decreased energy 0 0 0 0   Change in appetite 0 0 0 0   Feeling bad or failure about yourself  0 0 0 0   Trouble concentrating 0 0 0 0   Moving slowly or fidgety/restless 0 0 0 0   Suicidal thoughts 0 0 0 0   PHQ-9 Score 1 0 0 0   Difficult doing work/chores  Not difficult at all       Health Maintenance  Topic Date Due   Hepatitis C Screening  09/25/2021 (Originally 02/20/2006)   INFLUENZA VACCINE  09/30/2021   PAP SMEAR-Modifier  12/05/2021   TETANUS/TDAP  09/18/2029   HIV Screening  Completed   HPV VACCINES  Aged Out  Pap test with GYN. Up to date.  Mammogram ok this year - at GYN.  Cancer screening ok with GYN - slight increased risk with maternal FH of breast CA.   Immunization History   Administered Date(s) Administered   Influenza,inj,Quad PF,6+ Mos 11/08/2015, 03/28/2021   Influenza-Unspecified 12/15/2018   Tdap 11/08/2015  Covid vaccine: had initial vaccine, covid possible a week ago with sick contacts with covid and symptomatic. Covid booster  - discussed bivalent vaccine - can d/w GYN.  No results found. No recent optho - has mild Rx if needed - not needed.   Dental: every 6 months.  Alcohol: none currently, rare.   Tobacco: none  Exercise: 3-4 days per week. 30-48min.   History Patient Active Problem List   Diagnosis Date Noted   Encounter for induction of labor 12/15/2019   Incomplete miscarriage 02/26/2019   Vaginal bleeding affecting early pregnancy 02/26/2019   Encounter for procreative genetic counseling 11/08/2018   Situational anxiety 11/08/2015   Palpitations 06/19/2013   Dyspnea 06/19/2013   Retrocecal appendicitis 07/21/2012   Past Medical History:  Diagnosis Date   Anxiety    Bradycardia    Depression    Migraines    Mononucleosis    Palpitations    Tachycardia    Past Surgical History:  Procedure Laterality Date  CESAREAN SECTION N/A 12/16/2019   Procedure: CESAREAN SECTION;  Surgeon: Louretta Shorten, MD;  Location: [facility] ORS;  Service: Obstetrics;  Laterality: N/A;   LAPAROSCOPIC APPENDECTOMY N/A 07/21/2012   Procedure: APPENDECTOMY LAPAROSCOPIC;  Surgeon: Haywood Lasso, MD;  Location: [facility] ORS;  Service: General;  Laterality: N/A;   TONSILLECTOMY     TONSILLECTOMY     No Known Allergies Prior to Admission medications   Medication Sig Start Date End Date Taking? Authorizing Provider  Amphetamine ER (ADZENYS XR-ODT) 9.4 MG TBED Take 1 tablet by mouth daily. 09/11/21  Yes [provider]  escitalopram (LEXAPRO) 10 MG tablet Take 1 tablet (10 mg total) by mouth daily. 03/28/21  Yes Wendie Agreste, MD  diphenoxylate-atropine (LOMOTIL) 2.5-0.025 MG tablet Take 1 tablet by mouth 4 (four) times daily as needed for diarrhea  or loose stools. 05/07/21   Maximiano Coss, NP  eletriptan (RELPAX) 40 MG tablet Take 1 tablet by mouth as needed. Patient not taking: Reported on 09/25/2021 08/18/21   [provider]  erythromycin ophthalmic ointment Place 1 application. into both eyes 3 (three) times daily. 07/29/21   Maximiano Coss, NP  Galcanezumab-gnlm (EMGALITY) 120 MG/ML SOAJ Inject 1 mL as directed every 30 (thirty) days.    [provider]  rizatriptan (MAXALT) 10 MG tablet rizatriptan 10 mg tablet  1 TAB AS NEEDED MIGRAINE MAY REPEAT ONCE AFTER 2 HOURS. LIMIT 1-2 TREATMENTS PER WEEK Patient not taking: Reported on 09/25/2021    [provider]  topiramate (TOPAMAX) 100 MG tablet  04/16/20   [provider]  topiramate (TOPAMAX) 50 MG tablet topiramate 50 mg tablet  TAKE 3 TABLETS BY MOUTH EVERY DAY Patient not taking: Reported on 09/25/2021    [provider]   Social History   Socioeconomic History   Marital status: Married    Spouse name: Not on file   Number of children: 0   Years of education: Not on file   Highest education level: Not on file  Occupational History   Occupation: Singer/songwriter    Employer: [employer]  Tobacco Use   Smoking status: Never   Smokeless tobacco: Never  Substance and Sexual Activity   Alcohol use: Not Currently   Drug use: No   Sexual activity: Yes    Partners: Male    Birth control/protection: None  Other Topics Concern   Not on file  Social History Narrative   Not on file   Social Determinants of Health   Financial Resource Strain: Not on file  Food Insecurity: Not on file  Transportation Needs: Not on file  Physical Activity: Not on file  Stress: Not on file  Social Connections: Not on file  Intimate Partner Violence: Not on file    Review of Systems 13 point review of systems per patient health survey noted.  Negative other than as indicated above or in HPI.    Objective:   Vitals:   09/25/21 1419   BP: 128/74  Pulse: (!) 54  Resp: 16  Temp: 98.8 F (37.1 C)  TempSrc: Temporal  SpO2: 98%  Weight: 144 lb 6.4 oz (65.5 kg)  Height: $Remove'5\' 6"'JBvSQUR$  (1.676 m)     Physical Exam Constitutional:      Appearance: She is well-developed.  HENT:     Head: Normocephalic and atraumatic.     Right Ear: External ear normal.     Left Ear: External ear normal.  Eyes:     Conjunctiva/sclera: Conjunctivae normal.  Pupils: Pupils are equal, round, and reactive to light.  Neck:     Thyroid: No thyromegaly.  Cardiovascular:     Rate and Rhythm: Normal rate and regular rhythm.     Heart sounds: Normal heart sounds. No murmur heard. Pulmonary:     Effort: Pulmonary effort is normal. No respiratory distress.     Breath sounds: Normal breath sounds. No wheezing.  Abdominal:     General: Bowel sounds are normal.     Palpations: Abdomen is soft.     Tenderness: There is no abdominal tenderness.  Musculoskeletal:        General: No tenderness. Normal range of motion.     Cervical back: Normal range of motion and neck supple.  Lymphadenopathy:     Cervical: No cervical adenopathy.  Skin:    General: Skin is warm and dry.     Findings: No rash.  Neurological:     Mental Status: She is alert and oriented to person, place, and time.  Psychiatric:        Behavior: Behavior normal.        Thought Content: Thought content normal.        Assessment & Plan:  Antanasia Kaczynski is a 36 y.o. female . Annual physical exam - Plan: hCG, quantitative, pregnancy, Comprehensive metabolic panel, Lipid panel, Hemoglobin A1c  - -anticipatory guidance as below in AVS, screening labs above. Health maintenance items as above in HPI discussed/recommended as applicable.   -Continue Lexapro for anxiety, has come off other medications for headache, can discuss treatments with headache specialist if needed.  Now on meds as above for ADD with close follow-up with attention specialist planned.  Screening for hyperlipidemia  - Plan: Comprehensive metabolic panel, Lipid panel  Screening for diabetes mellitus - Plan: Comprehensive metabolic panel, Hemoglobin A1c  Visit for confirmation of pregnancy test result with physical exam - Plan: hCG, quantitative, pregnancy  -Recent ovulation, question of early pregnancy.  Quantitative hCG obtained which was in the normal/nonpregnant range.  Results sent to patient.  Continue follow-up gynecology.  No orders of the defined types were placed in this encounter.  Patient Instructions   I will let you know if there are any concerns in your labs.  Continue follow-up with attention specialist and GYN.  It would be early but I did check your urine test for pregnancy today and we will let you know results.  Certainly can check that with urine testing at home as well. Thanks for coming in and take care!  Preventive Care 34-40 Years Old, Female Preventive care refers to lifestyle choices and visits with your health care provider that can promote health and wellness. Preventive care visits are also called wellness exams. What can I expect for my preventive care visit? Counseling During your preventive care visit, your health care provider may ask about your: Medical history, including: Past medical problems. Family medical history. Pregnancy history. Current health, including: Menstrual cycle. Method of birth control. Emotional well-being. Home life and relationship well-being. Sexual activity and sexual health. Lifestyle, including: Alcohol, nicotine or tobacco, and drug use. Access to firearms. Diet, exercise, and sleep habits. Work and work Statistician. Sunscreen use. Safety issues such as seatbelt and bike helmet use. Physical exam Your health care provider may check your: Height and weight. These may be used to calculate your BMI (body mass index). BMI is a measurement that tells if you are at a healthy weight. Waist circumference. This measures the distance around  your waistline. This  measurement also tells if you are at a healthy weight and may help predict your risk of certain diseases, such as type 2 diabetes and high blood pressure. Heart rate and blood pressure. Body temperature. Skin for abnormal spots. What immunizations do I need?  Vaccines are usually given at various ages, according to a schedule. Your health care provider will recommend vaccines for you based on your age, medical history, and lifestyle or other factors, such as travel or where you work. What tests do I need? Screening Your health care provider may recommend screening tests for certain conditions. This may include: Pelvic exam and Pap test. Lipid and cholesterol levels. Diabetes screening. This is done by checking your blood sugar (glucose) after you have not eaten for a while (fasting). Hepatitis B test. Hepatitis C test. HIV (human immunodeficiency virus) test. STI (sexually transmitted infection) testing, if you are at risk. BRCA-related cancer screening. This may be done if you have a family history of breast, ovarian, tubal, or peritoneal cancers. Talk with your health care provider about your test results, treatment options, and if necessary, the need for more tests. Follow these instructions at home: Eating and drinking  Eat a healthy diet that includes fresh fruits and vegetables, whole grains, lean protein, and low-fat dairy products. Take vitamin and mineral supplements as recommended by your health care provider. Do not drink alcohol if: Your health care provider tells you not to drink. You are pregnant, may be pregnant, or are planning to become pregnant. If you drink alcohol: Limit how much you have to 0-1 drink a day. Know how much alcohol is in your drink. In the [place] one drink equals one 12 oz bottle of beer (355 mL), one 5 oz glass of wine (148 mL), or one 1 oz glass of hard liquor (44 mL). Lifestyle Brush your teeth every morning and night with  fluoride toothpaste. Floss one time each day. Exercise for at least 30 minutes 5 or more days each week. Do not use any products that contain nicotine or tobacco. These products include cigarettes, chewing tobacco, and vaping devices, such as e-cigarettes. If you need help quitting, ask your health care provider. Do not use drugs. If you are sexually active, practice safe sex. Use a condom or other form of protection to prevent STIs. If you do not wish to become pregnant, use a form of birth control. If you plan to become pregnant, see your health care provider for a prepregnancy visit. Find healthy ways to manage stress, such as: Meditation, yoga, or listening to music. Journaling. Talking to a trusted person. Spending time with friends and family. Minimize exposure to UV radiation to reduce your risk of skin cancer. Safety Always wear your seat belt while driving or riding in a vehicle. Do not drive: If you have been drinking alcohol. Do not ride with someone who has been drinking. If you have been using any mind-altering substances or drugs. While texting. When you are tired or distracted. Wear a helmet and other protective equipment during sports activities. If you have firearms in your house, make sure you follow all gun safety procedures. Seek help if you have been physically or sexually abused. What's next? Go to your health care provider once a year for an annual wellness visit. Ask your health care provider how often you should have your eyes and teeth checked. Stay up to date on all vaccines. This information is not intended to replace advice given to you by your health care  provider. Make sure you discuss any questions you have with your health care provider. Document Revised: 08/14/2020 Document Reviewed: 08/14/2020 Elsevier Patient Education  [place],   Merri Ray, MD [place], [place]  [facility] 09/25/21 2:41 PM

## 2021-09-26 ENCOUNTER — Encounter: Payer: Self-pay | Admitting: Family Medicine

## 2021-10-01 DIAGNOSIS — Z79899 Other long term (current) drug therapy: Secondary | ICD-10-CM | POA: Diagnosis not present

## 2021-10-01 DIAGNOSIS — F902 Attention-deficit hyperactivity disorder, combined type: Secondary | ICD-10-CM | POA: Diagnosis not present

## 2021-10-02 DIAGNOSIS — F902 Attention-deficit hyperactivity disorder, combined type: Secondary | ICD-10-CM | POA: Diagnosis not present

## 2021-10-18 ENCOUNTER — Other Ambulatory Visit: Payer: Self-pay

## 2021-10-18 ENCOUNTER — Encounter (HOSPITAL_BASED_OUTPATIENT_CLINIC_OR_DEPARTMENT_OTHER): Payer: Self-pay | Admitting: Emergency Medicine

## 2021-10-18 ENCOUNTER — Emergency Department (HOSPITAL_BASED_OUTPATIENT_CLINIC_OR_DEPARTMENT_OTHER)
Admission: EM | Admit: 2021-10-18 | Discharge: 2021-10-18 | Disposition: A | Discharge status: Home / Self Care | Payer: BC Managed Care – PPO | Attending: Emergency Medicine | Admitting: Emergency Medicine

## 2021-10-18 DIAGNOSIS — G43009 Migraine without aura, not intractable, without status migrainosus: Secondary | ICD-10-CM | POA: Diagnosis not present

## 2021-10-18 DIAGNOSIS — Z3A01 Less than 8 weeks gestation of pregnancy: Secondary | ICD-10-CM | POA: Diagnosis not present

## 2021-10-18 DIAGNOSIS — O99351 Diseases of the nervous system complicating pregnancy, first trimester: Secondary | ICD-10-CM | POA: Insufficient documentation

## 2021-10-18 DIAGNOSIS — O26891 Other specified pregnancy related conditions, first trimester: Secondary | ICD-10-CM | POA: Diagnosis not present

## 2021-10-18 MED ORDER — BUTALBITAL-APAP-CAFFEINE 50-325-40 MG PO TABS: 1.0000 | ORAL_TABLET | Freq: Once | ORAL | Status: DC

## 2021-10-18 MED ORDER — BUTALBITAL-APAP-CAFFEINE 50-325-40 MG PO TABS: 1.0000 | ORAL_TABLET | Freq: Four times a day (QID) | ORAL | 0 refills | Status: DC | PRN

## 2021-10-18 MED ORDER — PROCHLORPERAZINE EDISYLATE 10 MG/2ML IJ SOLN
10.0000 mg | Freq: Once | INTRAMUSCULAR | Status: AC
  Administered 2021-10-18: 10 mg via INTRAVENOUS
  Filled 2021-10-18: qty 2

## 2021-10-18 MED ORDER — DIPHENHYDRAMINE HCL 50 MG/ML IJ SOLN
12.5000 mg | Freq: Once | INTRAMUSCULAR | Status: AC
  Administered 2021-10-18: 12.5 mg via INTRAVENOUS
  Filled 2021-10-18: qty 1

## 2021-10-18 MED ORDER — SODIUM CHLORIDE 0.9 % IV BOLUS
500.0000 mL | Freq: Once | INTRAVENOUS | Status: AC
  Administered 2021-10-18: 500 mL via INTRAVENOUS

## 2021-10-18 NOTE — ED Provider Notes (Signed)
[facility] EMERGENCY DEPARTMENT Provider Note   CSN: [id number] Arrival date & time: 10/18/21  2151     History  Chief Complaint  Patient presents with   Migraine    Lori Rice is a 36 y.o. female.  Patient here with migraine headache.  She has history of migraines on Topamax and sumatriptan and Flexeril.  She is now [redacted] weeks pregnant and unable to take Topamax and sumatriptan.  Headaches been slightly worse today than normal.  She took Flexeril this morning with not much relief.  She feels like maybe she is little bit dehydrated.  She denies any vaginal bleeding or abdominal pain.  She has no pregnancy complications.  She has OB follow-up set up.  She has been taking prenatals.  She denies any trauma.  Nothing has made it better or worse.  Feels like her typical bad migraines.  The history is provided by the patient.       Home Medications Prior to Admission medications   Medication Sig Start Date End Date Taking? Authorizing Provider  butalbital-acetaminophen-caffeine (FIORICET) 50-325-40 MG tablet Take 1-2 tablets by mouth every 6 (six) hours as needed for headache. 10/18/21 10/18/22 Yes Rajesh Wyss, DO  Amphetamine ER (ADZENYS XR-ODT) 9.4 MG TBED Take 1 tablet by mouth daily. 09/11/21   [provider]  escitalopram (LEXAPRO) 10 MG tablet Take 1 tablet (10 mg total) by mouth daily. 03/28/21   Shade Flood, MD      Allergies    Patient has no known allergies.    Review of Systems   Review of Systems  Physical Exam Updated Vital Signs BP 115/85 (BP Location: Left Arm)   Pulse 77   Temp 97.9 F (36.6 C) (Oral)   Resp 16   Ht 5\' 6"  (1.676 m)   Wt 65.3 kg   LMP 04/14/2021   SpO2 100%   BMI 23.24 kg/m  Physical Exam Vitals and nursing note reviewed.  Constitutional:      General: She is not in acute distress.    Appearance: She is well-developed. She is not ill-appearing.  HENT:     Head: Normocephalic and atraumatic.     Nose: Nose  normal.     Mouth/Throat:     Mouth: Mucous membranes are moist.  Eyes:     Extraocular Movements: Extraocular movements intact.     Conjunctiva/sclera: Conjunctivae normal.     Pupils: Pupils are equal, round, and reactive to light.  Cardiovascular:     Rate and Rhythm: Normal rate and regular rhythm.     Pulses: Normal pulses.     Heart sounds: Normal heart sounds. No murmur heard. Pulmonary:     Effort: Pulmonary effort is normal. No respiratory distress.     Breath sounds: Normal breath sounds.  Abdominal:     Palpations: Abdomen is soft.     Tenderness: There is no abdominal tenderness.  Musculoskeletal:        General: No swelling.     Cervical back: Normal range of motion and neck supple.  Skin:    General: Skin is warm and dry.     Capillary Refill: Capillary refill takes less than 2 seconds.  Neurological:     General: No focal deficit present.     Mental Status: She is alert and oriented to person, place, and time.     Cranial Nerves: No cranial nerve deficit.     Sensory: No sensory deficit.     Motor: No weakness.  Coordination: Coordination normal.     Comments: 5+ out of 5 strength throughout, normal sensation, no drift, normal finger-nose-finger, normal speech  Psychiatric:        Mood and Affect: Mood normal.     ED Results / Procedures / Treatments   Labs (all labs ordered are listed, but only abnormal results are displayed) Labs Reviewed - No data to display  EKG None  Radiology No results found.  Procedures Procedures    Medications Ordered in ED Medications  sodium chloride 0.9 % bolus 500 mL (has no administration in time range)  diphenhydrAMINE (BENADRYL) injection 12.5 mg (12.5 mg Intravenous Given 10/18/21 2239)  prochlorperazine (COMPAZINE) injection 10 mg (10 mg Intravenous Given 10/18/21 2239)    ED Course/ Medical Decision Making/ A&P                           Medical Decision Making Risk Prescription drug  management.   [name] is here with migraine.  History of migraines.  She is about [redacted] weeks pregnant now unable to take her Topamax and sumatriptan.  Bad headache today.  She took Flexeril without much help.  Nothing makes it worse or better.  She has normal vitals.  No trauma.  She has no abdominal pain or vaginal bleeding.  I have no concern for any pregnancy issues at this time.  She has no abdominal tenderness.  Neurologically she is intact.  Overall this seems to be a migraine headache.  Will give IV Compazine, Benadryl and fluid bolus.  I will prescribe Fioricet.  We do not have any Fioricet at this time here in the ED.  Otherwise patient appears well.  Feeling better after headache cocktail.  Discharged in good condition.  Recommend follow-up with neurology about further headache plan/migraine plan during pregnancy.  Discharged in good condition.  This chart was dictated using voice recognition software.  Despite best efforts to proofread,  errors can occur which can change the documentation meaning.          Final Clinical Impression(s) / ED Diagnoses Final diagnoses:  Migraine without aura and without status migrainosus, not intractable    Rx / DC Orders ED Discharge Orders          Ordered    butalbital-acetaminophen-caffeine (FIORICET) 50-325-40 MG tablet  Every 6 hours PRN        10/18/21 2228              Virgina Norfolk, DO 10/18/21 2240

## 2021-10-18 NOTE — ED Triage Notes (Signed)
Migraine and left sided neck pain since this morning. States she is [redacted] wks pregnant and unable to take prescribed meds from neurologist.

## 2021-10-18 NOTE — Discharge Instructions (Signed)
Take Fioricet as prescribed for bad headaches, this does have tylenol in it and please be aware to not take additonal tylenol with this medicine.  Please consult your neurologist about further migraine plan during pregnancy.

## 2021-10-22 ENCOUNTER — Telehealth: Payer: Self-pay | Admitting: Family Medicine

## 2021-10-22 NOTE — Telephone Encounter (Signed)
FYI about pt migraine

## 2021-10-22 NOTE — Telephone Encounter (Signed)
Initial Comment Caller states she is [redacted] weeks pregnant. She has migraine and wants to know what she can take. She has taken a muscle relaxer that is not helping. Translation No Nurse Assessment Nurse: Chilton Si, RN, Tanika Date/Time (Eastern Time): 10/18/2021 9:24:54 PM Confirm and document reason for call. If symptomatic, describe symptoms. ---Caller states she is [redacted] weeks pregnant. She has migraine ongoing all day and wants to know what she can take. She has taken a muscle relaxer that is not helping. states tylenol doesnt help Does the patient have any new or worsening symptoms? ---Yes Will a triage be completed? ---Yes Related visit to physician within the last 2 weeks? ---Yes Does the PT have any chronic conditions? (i.e. diabetes, asthma, this includes High risk factors for pregnancy, etc.) ---Yes List chronic conditions. ---migraines, Is the patient pregnant or possibly pregnant? (Ask all females between the ages of 91-55) ---Yes How many weeks gestation? ---6 What is the estimated delivery date? ---2022-06-14 Total number of pregnancies including current? ---3 Number of live births? ---1 Have you felt decreased fetal movement? ---Early Pregnancy - No Fetal Movement Felt Yet PLEASE NOTE: All timestamps contained within this report are represented as [place] Standard Time. CONFIDENTIALTY NOTICE: This fax transmission is intended only for the addressee. It contains information that is legally privileged, confidential or otherwise protected from use or disclosure. If you are not the intended recipient, you are strictly prohibited from reviewing, disclosing, copying using or disseminating any of this information or taking any action in reliance on or regarding this information. If you have received this fax in error, please notify us immediately by telephone so that we can arrange for its return to [place]. Phone: [phone], Toll-Free: [phone], Fax: [phone] Page: 2 of 2 Call Id:  [id number] Nurse Assessment Is this a behavioral health or substance abuse call? ---No Guidelines Guideline Title Affirmed Question Affirmed Notes Nurse Date/Time Lamount Cohen Time) Pregnancy - Headache [1] SEVERE headache (e.g., excruciating) AND [2] "worst headache" of life Adine Madura 10/18/2021 9:25:36 PM Disp. Time Lamount Cohen Time) Disposition Final User 10/18/2021 9:28:47 PM Go to ED Now (or PCP triage) Yes Chilton Si, RN, Alcario Drought Final Disposition 10/18/2021 9:28:47 PM Go to ED Now (or PCP triage) Yes Chilton Si, RN, Leonie Man Disagree/Comply Comply Caller Understands Yes PreDisposition Did not know what to do Care Advice Given Per Guideline GO TO ED NOW (OR PCP TRIAGE): CARE ADVICE given per Pregnancy - Headache (Adult) guideline. * It is better and safer if another adult drives instead of you. ANOTHER ADULT SHOULD DRIVE:

## 2021-10-30 DIAGNOSIS — N911 Secondary amenorrhea: Secondary | ICD-10-CM | POA: Diagnosis not present

## 2021-11-07 NOTE — Telephone Encounter (Signed)
error 

## 2021-11-18 DIAGNOSIS — Z3481 Encounter for supervision of other normal pregnancy, first trimester: Secondary | ICD-10-CM | POA: Diagnosis not present

## 2021-11-18 DIAGNOSIS — Z3685 Encounter for antenatal screening for Streptococcus B: Secondary | ICD-10-CM | POA: Diagnosis not present

## 2021-11-18 LAB — OB RESULTS CONSOLE RUBELLA ANTIBODY, IGM: Rubella: IMMUNE

## 2021-11-18 LAB — OB RESULTS CONSOLE ABO/RH: RH Type: NEGATIVE

## 2021-11-18 LAB — OB RESULTS CONSOLE ANTIBODY SCREEN: Antibody Screen: NEGATIVE

## 2021-11-18 LAB — OB RESULTS CONSOLE RPR: RPR: NONREACTIVE

## 2021-11-18 LAB — OB RESULTS CONSOLE GC/CHLAMYDIA
Chlamydia: NEGATIVE
Neisseria Gonorrhea: NEGATIVE

## 2021-11-18 LAB — OB RESULTS CONSOLE HEPATITIS B SURFACE ANTIGEN: Hepatitis B Surface Ag: NEGATIVE

## 2021-11-18 LAB — OB RESULTS CONSOLE HIV ANTIBODY (ROUTINE TESTING): HIV: NONREACTIVE

## 2021-11-20 DIAGNOSIS — Z3481 Encounter for supervision of other normal pregnancy, first trimester: Secondary | ICD-10-CM | POA: Diagnosis not present

## 2021-11-20 DIAGNOSIS — Z3A10 10 weeks gestation of pregnancy: Secondary | ICD-10-CM | POA: Diagnosis not present

## 2021-11-20 DIAGNOSIS — Z113 Encounter for screening for infections with a predominantly sexual mode of transmission: Secondary | ICD-10-CM | POA: Diagnosis not present

## 2021-11-20 DIAGNOSIS — Z1151 Encounter for screening for human papillomavirus (HPV): Secondary | ICD-10-CM | POA: Diagnosis not present

## 2021-11-20 DIAGNOSIS — Z124 Encounter for screening for malignant neoplasm of cervix: Secondary | ICD-10-CM | POA: Diagnosis not present

## 2021-11-20 LAB — RESULTS CONSOLE HPV: CHL HPV: NEGATIVE

## 2021-11-21 LAB — HM PAP SMEAR: HM Pap smear: NORMAL

## 2021-12-24 ENCOUNTER — Other Ambulatory Visit: Payer: Self-pay | Admitting: Family Medicine

## 2021-12-24 DIAGNOSIS — F418 Other specified anxiety disorders: Secondary | ICD-10-CM

## 2021-12-25 DIAGNOSIS — Z23 Encounter for immunization: Secondary | ICD-10-CM | POA: Diagnosis not present

## 2022-01-26 DIAGNOSIS — Z361 Encounter for antenatal screening for raised alphafetoprotein level: Secondary | ICD-10-CM | POA: Diagnosis not present

## 2022-01-26 DIAGNOSIS — Z3A20 20 weeks gestation of pregnancy: Secondary | ICD-10-CM | POA: Diagnosis not present

## 2022-01-26 DIAGNOSIS — Z363 Encounter for antenatal screening for malformations: Secondary | ICD-10-CM | POA: Diagnosis not present

## 2022-02-11 ENCOUNTER — Encounter (HOSPITAL_COMMUNITY): Payer: Self-pay | Admitting: Obstetrics & Gynecology

## 2022-02-11 ENCOUNTER — Inpatient Hospital Stay (HOSPITAL_COMMUNITY)
Admission: AD | Admit: 2022-02-11 | Discharge: 2022-02-11 | Disposition: A | Discharge status: Home / Self Care | Payer: BC Managed Care – PPO | Attending: Obstetrics & Gynecology | Admitting: Obstetrics & Gynecology

## 2022-02-11 ENCOUNTER — Other Ambulatory Visit: Payer: Self-pay

## 2022-02-11 ENCOUNTER — Inpatient Hospital Stay (HOSPITAL_COMMUNITY): Payer: BC Managed Care – PPO

## 2022-02-11 DIAGNOSIS — R1011 Right upper quadrant pain: Secondary | ICD-10-CM | POA: Diagnosis not present

## 2022-02-11 DIAGNOSIS — Z3A22 22 weeks gestation of pregnancy: Secondary | ICD-10-CM | POA: Insufficient documentation

## 2022-02-11 DIAGNOSIS — O26892 Other specified pregnancy related conditions, second trimester: Secondary | ICD-10-CM | POA: Diagnosis not present

## 2022-02-11 DIAGNOSIS — R109 Unspecified abdominal pain: Secondary | ICD-10-CM | POA: Diagnosis not present

## 2022-02-11 LAB — CBC WITH DIFFERENTIAL/PLATELET
Abs Immature Granulocytes: 0.06 10*3/uL (ref 0.00–0.07)
Basophils Absolute: 0.1 10*3/uL (ref 0.0–0.1)
Basophils Relative: 1 %
Eosinophils Absolute: 0.2 10*3/uL (ref 0.0–0.5)
Eosinophils Relative: 3 %
HCT: 35.8 % — ABNORMAL LOW (ref 36.0–46.0)
Hemoglobin: 12.2 g/dL (ref 12.0–15.0)
Immature Granulocytes: 1 %
Lymphocytes Relative: 17 %
Lymphs Abs: 1.4 10*3/uL (ref 0.7–4.0)
MCH: 30.6 pg (ref 26.0–34.0)
MCHC: 34.1 g/dL (ref 30.0–36.0)
MCV: 89.7 fL (ref 80.0–100.0)
Monocytes Absolute: 0.8 10*3/uL (ref 0.1–1.0)
Monocytes Relative: 10 %
Neutro Abs: 5.6 10*3/uL (ref 1.7–7.7)
Neutrophils Relative %: 68 %
Platelets: 159 10*3/uL (ref 150–400)
RBC: 3.99 MIL/uL (ref 3.87–5.11)
RDW: 12.8 % (ref 11.5–15.5)
WBC: 8.2 10*3/uL (ref 4.0–10.5)
nRBC: 0 % (ref 0.0–0.2)

## 2022-02-11 LAB — URINALYSIS, ROUTINE W REFLEX MICROSCOPIC
Bilirubin Urine: NEGATIVE
Glucose, UA: NEGATIVE mg/dL
Hgb urine dipstick: NEGATIVE
Ketones, ur: NEGATIVE mg/dL
Leukocytes,Ua: NEGATIVE
Nitrite: NEGATIVE
Protein, ur: NEGATIVE mg/dL
Specific Gravity, Urine: 1.009 (ref 1.005–1.030)
pH: 6 (ref 5.0–8.0)

## 2022-02-11 LAB — COMPREHENSIVE METABOLIC PANEL
ALT: 13 U/L (ref 0–44)
AST: 19 U/L (ref 15–41)
Albumin: 3.2 g/dL — ABNORMAL LOW (ref 3.5–5.0)
Alkaline Phosphatase: 52 U/L (ref 38–126)
Anion gap: 7 (ref 5–15)
BUN: 8 mg/dL (ref 6–20)
CO2: 23 mmol/L (ref 22–32)
Calcium: 8.8 mg/dL — ABNORMAL LOW (ref 8.9–10.3)
Chloride: 108 mmol/L (ref 98–111)
Creatinine, Ser: 0.64 mg/dL (ref 0.44–1.00)
GFR, Estimated: >60 mL/min (ref >60)
Glucose, Bld: 78 mg/dL (ref 70–99)
Potassium: 4.2 mmol/L (ref 3.5–5.1)
Sodium: 138 mmol/L (ref 135–145)
Total Bilirubin: 0.6 mg/dL (ref 0.3–1.2)
Total Protein: 6.2 g/dL — ABNORMAL LOW (ref 6.5–8.1)

## 2022-02-11 LAB — LIPASE, BLOOD: Lipase: 36 U/L (ref 11–51)

## 2022-02-11 NOTE — MAU Note (Signed)
Lori Rice is a 37 y.o. at [redacted]w[redacted]d here in MAU reporting: for about 5 hours today had stabbing upper abdominal pain. Called the office and they thought it was gas. States pain didn't go away and was advised to come in to rule out gall bladder. Pain has subsided. No bleeding or LOF. +FM  Onset of complaint: today  Pain score: 0/10  Vitals:   02/11/22 1844  BP: (!) 109/59  Pulse: 71  Resp: 16  Temp: 97.9 F (36.6 C)  SpO2: 99%     FHT:142  Lab orders placed from triage: UA

## 2022-02-11 NOTE — MAU Provider Note (Addendum)
History     CSN: [id number]  Arrival date and time: 02/11/22 1802   Event Date/Time   First Provider Initiated Contact with Patient 02/11/22 1848      No chief complaint on file.  HPI  Lori Rice is a 36 y.o. G3P1011 at [redacted]w[redacted]d who presents to MAU with chief complaint of upper mid-abdominal pain.  Patient endorses acute onset pain which lasted about 5 hours before resolving completely. Also accompanied by general TTP at locus of pain. She called the RN line and was told her pain could be gas. Patient called the RN line again when her pain persisted and was told it could be gallstones. She denies RUQ pain. She denies right flank pain. Pain did not occur 2/2 heavy meal. She denies lower abdominal pain, dysuria, fever, vomiting or recent illness.  Patient receives care with [facility].  OB History     Gravida  3   Para  1   Term  1   Preterm  0   AB  1   Living  1      SAB  1   IAB  0   Ectopic  0   Multiple  0   Live Births  1           Past Medical History:  Diagnosis Date   Anxiety    Bradycardia    Depression    Migraines    Mononucleosis    Palpitations    Tachycardia     Past Surgical History:  Procedure Laterality Date   CESAREAN SECTION N/A 12/16/2019   Procedure: CESAREAN SECTION;  Surgeon: Candice Camp, MD;  Location: [facility] LD [facility];  Service: Obstetrics;  Laterality: N/A;   LAPAROSCOPIC APPENDECTOMY N/A 07/21/2012   Procedure: APPENDECTOMY LAPAROSCOPIC;  Surgeon: Currie Paris, MD;  Location: [facility] ORS;  Service: General;  Laterality: N/A;   TONSILLECTOMY     TONSILLECTOMY      Family History  Problem Relation Age of Onset   Breast cancer Mother    Lung cancer Paternal Grandfather    Heart disease Paternal Grandfather    Hypertension Maternal Grandmother    Bladder Cancer Maternal Grandfather    Hypertension Maternal Grandfather    Stroke Maternal Grandfather    Heart disease Maternal Grandfather    Heart failure Maternal  Grandfather    Breast cancer Paternal Grandmother     Social History   Tobacco Use   Smoking status: Never   Smokeless tobacco: Never  Substance Use Topics   Alcohol use: Not Currently   Drug use: No    Allergies: No Known Allergies  Medications Prior to Admission  Medication Sig Dispense Refill Last Dose   Amphetamine ER (ADZENYS XR-ODT) 9.4 MG TBED Take 1 tablet by mouth daily.      butalbital-acetaminophen-caffeine (FIORICET) 50-325-40 MG tablet Take 1-2 tablets by mouth every 6 (six) hours as needed for headache. 20 tablet 0    escitalopram (LEXAPRO) 10 MG tablet TAKE ONE TABLET BY MOUTH ONE TIME DAILY 90 tablet 0     Review of Systems  Gastrointestinal:  Positive for abdominal pain.  All other systems reviewed and are negative.  Physical Exam   Blood pressure (!) 109/59, pulse 71, temperature 97.9 F (36.6 C), temperature source Oral, resp. rate 16, height 5' 6.5" (1.689 m), weight 77.6 kg, last menstrual period 04/14/2021, SpO2 99 %, unknown if currently breastfeeding.  Physical Exam Vitals and nursing note reviewed. Exam conducted with a chaperone present.  Constitutional:      General: She is not in acute distress.    Appearance: She is not ill-appearing.  Cardiovascular:     Rate and Rhythm: Normal rate and regular rhythm.     Pulses: Normal pulses.     Heart sounds: Normal heart sounds.  Pulmonary:     Effort: Pulmonary effort is normal.     Breath sounds: Normal breath sounds.  Skin:    General: Skin is warm and dry.     Capillary Refill: Capillary refill takes less than 2 seconds.  Neurological:     Mental Status: She is alert and oriented to person, place, and time.  Psychiatric:        Mood and Affect: Mood normal.        Behavior: Behavior normal.        Thought Content: Thought content normal.        Judgment: Judgment normal.     MAU Course  Procedures  MDM  Orders Placed This Encounter  Procedures   US ABDOMEN LIMITED RUQ (LIVER/GB)    Urinalysis, Routine w reflex microscopic Urine, Clean Catch   CBC with Differential/Platelet   Comprehensive metabolic panel   Lipase, blood   Patient Vitals for the past 24 hrs:  BP Temp Temp src Pulse Resp SpO2 Height Weight  02/11/22 1844 (!) 109/59 97.9 F (36.6 C) Oral 71 16 99 % -- --  02/11/22 1835 -- -- -- -- -- -- 5' 6.5" (1.689 m) 77.6 kg   Report given to Sabas Sous, CNM who assumes care at this time  Clayton Bibles, MSA, MSN, CNM Certified Nurse Midwife, [facility] 02/11/22 8:03 PM  Assessment and Plan   Results for orders placed or performed during the hospital encounter of 02/11/22 (from the past 24 hour(s))  Urinalysis, Routine w reflex microscopic Urine, Clean Catch     Status: None   Collection Time: 02/11/22  6:57 PM  Result Value Ref Range   Color, Urine YELLOW YELLOW   APPearance CLEAR CLEAR   Specific Gravity, Urine 1.009 1.005 - 1.030   pH 6.0 5.0 - 8.0   Glucose, UA NEGATIVE NEGATIVE mg/dL   Hgb urine dipstick NEGATIVE NEGATIVE   Bilirubin Urine NEGATIVE NEGATIVE   Ketones, ur NEGATIVE NEGATIVE mg/dL   Protein, ur NEGATIVE NEGATIVE mg/dL   Nitrite NEGATIVE NEGATIVE   Leukocytes,Ua NEGATIVE NEGATIVE  CBC with Differential/Platelet     Status: Abnormal   Collection Time: 02/11/22  7:16 PM  Result Value Ref Range   WBC 8.2 4.0 - 10.5 K/uL   RBC 3.99 3.87 - 5.11 MIL/uL   Hemoglobin 12.2 12.0 - 15.0 g/dL   HCT 70.0 (L) 17.4 - 94.4 %   MCV 89.7 80.0 - 100.0 fL   MCH 30.6 26.0 - 34.0 pg   MCHC 34.1 30.0 - 36.0 g/dL   RDW 96.7 59.1 - 63.8 %   Platelets 159 150 - 400 K/uL   nRBC 0.0 0.0 - 0.2 %   Neutrophils Relative % 68 %   Neutro Abs 5.6 1.7 - 7.7 K/uL   Lymphocytes Relative 17 %   Lymphs Abs 1.4 0.7 - 4.0 K/uL   Monocytes Relative 10 %   Monocytes Absolute 0.8 0.1 - 1.0 K/uL   Eosinophils Relative 3 %   Eosinophils Absolute 0.2 0.0 - 0.5 K/uL   Basophils Relative 1 %   Basophils Absolute 0.1 0.0 - 0.1 K/uL   Immature Granulocytes 1 %    Abs Immature Granulocytes  0.06 0.00 - 0.07 K/uL  Comprehensive metabolic panel     Status: Abnormal   Collection Time: 02/11/22  7:16 PM  Result Value Ref Range   Sodium 138 135 - 145 mmol/L   Potassium 4.2 3.5 - 5.1 mmol/L   Chloride 108 98 - 111 mmol/L   CO2 23 22 - 32 mmol/L   Glucose, Bld 78 70 - 99 mg/dL   BUN 8 6 - 20 mg/dL   Creatinine, Ser 6.43 0.44 - 1.00 mg/dL   Calcium 8.8 (L) 8.9 - 10.3 mg/dL   Total Protein 6.2 (L) 6.5 - 8.1 g/dL   Albumin 3.2 (L) 3.5 - 5.0 g/dL   AST 19 15 - 41 U/L   ALT 13 0 - 44 U/L   Alkaline Phosphatase 52 38 - 126 U/L   Total Bilirubin 0.6 0.3 - 1.2 mg/dL   GFR, Estimated >32 >95 mL/min   Anion gap 7 5 - 15  Lipase, blood     Status: None   Collection Time: 02/11/22  7:16 PM  Result Value Ref Range   Lipase 36 11 - 51 U/L   US ABDOMEN LIMITED RUQ (LIVER/GB)  Result Date: 02/11/2022 CLINICAL DATA:  Pain upper abdomen EXAM: ULTRASOUND ABDOMEN LIMITED RIGHT UPPER QUADRANT COMPARISON:  None Available. FINDINGS: Gallbladder: No gallstones or wall thickening visualized. No sonographic Murphy sign noted by sonographer. Common bile duct: Diameter: 4 mm Liver: No focal lesion identified. Within normal limits in parenchymal echogenicity. Portal vein is patent on color Doppler imaging with normal direction of blood flow towards the liver. Other: None. IMPRESSION: No sonographic abnormalities are seen in right upper quadrant of abdomen. Electronically Signed   By: Ernie Avena M.D.   On: 02/11/2022 20:14    Reassessment 9:19 PM) -Results as above -Provider to bedside to discuss. -Patient expresses relief with normal findings. -Instructed to monitor symptoms and report occurrences and/or worsening to primary ob. -Precautions given. -Discharged to home in stable condition.  Cherre Robins MSN, CNM Advanced Practice Provider, [facility]

## 2022-02-20 DIAGNOSIS — Z362 Encounter for other antenatal screening follow-up: Secondary | ICD-10-CM | POA: Diagnosis not present

## 2022-02-20 DIAGNOSIS — Z3A23 23 weeks gestation of pregnancy: Secondary | ICD-10-CM | POA: Diagnosis not present

## 2022-03-02 NOTE — L&D Delivery Note (Signed)
Delivery Note At 10:31 PM a viable female was delivered via VBAC, Spontaneous (Presentation:   Occiput Anterior).  APGAR: 9, 9; weight pending.   Placenta status: Spontaneous, Intact.  Cord: 3 vessels with the following complications: None.  Cord pH: n/a  Anesthesia: Epidural Episiotomy: None Lacerations: 2nd degree;Periurethral Suture Repair: 3.0 vicryl rapide Est. Blood Loss (mL): 250  Mom to postpartum.  Baby to Couplet care / Skin to Skin.  Lori Rice 06/11/2022, 11:06 PM

## 2022-03-20 DIAGNOSIS — Z23 Encounter for immunization: Secondary | ICD-10-CM | POA: Diagnosis not present

## 2022-03-20 DIAGNOSIS — Z3480 Encounter for supervision of other normal pregnancy, unspecified trimester: Secondary | ICD-10-CM | POA: Diagnosis not present

## 2022-05-18 DIAGNOSIS — Z3A36 36 weeks gestation of pregnancy: Secondary | ICD-10-CM | POA: Diagnosis not present

## 2022-05-18 DIAGNOSIS — Z361 Encounter for antenatal screening for raised alphafetoprotein level: Secondary | ICD-10-CM | POA: Diagnosis not present

## 2022-05-18 DIAGNOSIS — O99891 Other specified diseases and conditions complicating pregnancy: Secondary | ICD-10-CM | POA: Diagnosis not present

## 2022-05-18 LAB — OB RESULTS CONSOLE GBS: GBS: NEGATIVE

## 2022-05-22 ENCOUNTER — Inpatient Hospital Stay (HOSPITAL_BASED_OUTPATIENT_CLINIC_OR_DEPARTMENT_OTHER): Payer: BC Managed Care – PPO

## 2022-05-22 ENCOUNTER — Other Ambulatory Visit: Payer: Self-pay

## 2022-05-22 ENCOUNTER — Observation Stay (HOSPITAL_COMMUNITY)
Admission: AD | Admit: 2022-05-22 | Discharge: 2022-05-23 | Disposition: A | Discharge status: Home / Self Care | Payer: BC Managed Care – PPO | Attending: Obstetrics and Gynecology | Admitting: Obstetrics and Gynecology

## 2022-05-22 ENCOUNTER — Encounter (HOSPITAL_COMMUNITY): Payer: Self-pay | Admitting: Obstetrics and Gynecology

## 2022-05-22 DIAGNOSIS — W19XXXA Unspecified fall, initial encounter: Secondary | ICD-10-CM | POA: Diagnosis present

## 2022-05-22 DIAGNOSIS — O26893 Other specified pregnancy related conditions, third trimester: Secondary | ICD-10-CM | POA: Diagnosis not present

## 2022-05-22 DIAGNOSIS — S3991XA Unspecified injury of abdomen, initial encounter: Secondary | ICD-10-CM | POA: Insufficient documentation

## 2022-05-22 DIAGNOSIS — O9A213 Injury, poisoning and certain other consequences of external causes complicating pregnancy, third trimester: Principal | ICD-10-CM | POA: Insufficient documentation

## 2022-05-22 DIAGNOSIS — O403XX0 Polyhydramnios, third trimester, not applicable or unspecified: Secondary | ICD-10-CM

## 2022-05-22 DIAGNOSIS — Z3A36 36 weeks gestation of pregnancy: Secondary | ICD-10-CM

## 2022-05-22 DIAGNOSIS — W109XXA Fall (on) (from) unspecified stairs and steps, initial encounter: Secondary | ICD-10-CM | POA: Diagnosis not present

## 2022-05-22 DIAGNOSIS — W108XXA Fall (on) (from) other stairs and steps, initial encounter: Secondary | ICD-10-CM

## 2022-05-22 DIAGNOSIS — O4703 False labor before 37 completed weeks of gestation, third trimester: Secondary | ICD-10-CM | POA: Insufficient documentation

## 2022-05-22 DIAGNOSIS — O34219 Maternal care for unspecified type scar from previous cesarean delivery: Secondary | ICD-10-CM

## 2022-05-22 DIAGNOSIS — Z98891 History of uterine scar from previous surgery: Secondary | ICD-10-CM | POA: Insufficient documentation

## 2022-05-22 LAB — TYPE AND SCREEN
ABO/RH(D): A NEG
Antibody Screen: NEGATIVE

## 2022-05-22 MED ORDER — DOCUSATE SODIUM 100 MG PO CAPS: 100.0000 mg | ORAL_CAPSULE | Freq: Every day | ORAL | Status: DC

## 2022-05-22 MED ORDER — LACTATED RINGERS IV SOLN: 125.0000 mL/h | INTRAVENOUS | Status: DC

## 2022-05-22 MED ORDER — ZOLPIDEM TARTRATE 5 MG PO TABS: 5.0000 mg | ORAL_TABLET | Freq: Every evening | ORAL | Status: DC | PRN

## 2022-05-22 MED ORDER — ACETAMINOPHEN 325 MG PO TABS: 650.0000 mg | ORAL_TABLET | ORAL | Status: DC | PRN

## 2022-05-22 MED ORDER — PRENATAL MULTIVITAMIN CH: 1.0000 | ORAL_TABLET | Freq: Every day | ORAL | Status: DC

## 2022-05-22 MED ORDER — CALCIUM CARBONATE ANTACID 500 MG PO CHEW
2.0000 | CHEWABLE_TABLET | ORAL | Status: DC | PRN
Start: 2022-05-22 — End: 2022-05-23

## 2022-05-22 MED ORDER — DOXYLAMINE SUCCINATE (SLEEP) 25 MG PO TABS
25.0000 mg | ORAL_TABLET | Freq: Every evening | ORAL | Status: DC | PRN
  Administered 2022-05-22: 25 mg via ORAL
  Filled 2022-05-22: qty 1

## 2022-05-22 NOTE — MAU Provider Note (Signed)
History     CSN: [id number]  Arrival date and time: 05/22/22 1853   Event Date/Time   First Provider Initiated Contact with Patient 05/22/22 1919      Chief Complaint  Patient presents with   Fall   HPI  Lori Rice is a 37 y.o. G3P1011 at [redacted]w[redacted]d who presents for evaluation after a fall. Patient reports she fell down two steps at 1800 and landed directly on her abdomen. She reports she scuffed her hands and knees on the way down. She reports she is feeling some intermittent tightening but feels like that is her normal. She denies any pain.  She denies any vaginal bleeding, discharge, and leaking of fluid. Denies any constipation, diarrhea or any urinary complaints. Reports normal fetal movement.   OB History     Gravida  3   Para  1   Term  1   Preterm  0   AB  1   Living  1      SAB  1   IAB  0   Ectopic  0   Multiple  0   Live Births  1           Past Medical History:  Diagnosis Date   Anxiety    Bradycardia    Depression    Migraines    Mononucleosis    Palpitations    Tachycardia     Past Surgical History:  Procedure Laterality Date   CESAREAN SECTION N/A 12/16/2019   Procedure: CESAREAN SECTION;  Surgeon: Louretta Shorten, MD;  Location: [place] LD ORS;  Service: Obstetrics;  Laterality: N/A;   LAPAROSCOPIC APPENDECTOMY N/A 07/21/2012   Procedure: APPENDECTOMY LAPAROSCOPIC;  Surgeon: Haywood Lasso, MD;  Location: [facility]S;  Service: General;  Laterality: N/A;   TONSILLECTOMY     TONSILLECTOMY      Family History  Problem Relation Age of Onset   Breast cancer Mother    Lung cancer Paternal Grandfather    Heart disease Paternal Grandfather    Hypertension Maternal Grandmother    Bladder Cancer Maternal Grandfather    Hypertension Maternal Grandfather    Stroke Maternal Grandfather    Heart disease Maternal Grandfather    Heart failure Maternal Grandfather    Breast cancer Paternal Grandmother     Social History   Tobacco Use    Smoking status: Never   Smokeless tobacco: Never  Substance Use Topics   Alcohol use: Not Currently   Drug use: No    Allergies: No Known Allergies  Medications Prior to Admission  Medication Sig Dispense Refill Last Dose   escitalopram (LEXAPRO) 10 MG tablet TAKE ONE TABLET BY MOUTH ONE TIME DAILY 90 tablet 0 05/22/2022   Prenatal Vit-Fe Fumarate-FA (MULTIVITAMIN-PRENATAL) 27-0.8 MG TABS tablet Take 1 tablet by mouth daily at 12 noon.   05/22/2022   Amphetamine ER (ADZENYS XR-ODT) 9.4 MG TBED Take 1 tablet by mouth daily.      butalbital-acetaminophen-caffeine (FIORICET) 50-325-40 MG tablet Take 1-2 tablets by mouth every 6 (six) hours as needed for headache. 20 tablet 0     Review of Systems  Constitutional: Negative.  Negative for fatigue and fever.  HENT: Negative.    Respiratory: Negative.  Negative for shortness of breath.   Cardiovascular: Negative.  Negative for chest pain.  Gastrointestinal: Negative.  Negative for abdominal pain, constipation, diarrhea, nausea and vomiting.  Genitourinary: Negative.  Negative for dysuria, vaginal bleeding and vaginal discharge.  Neurological: Negative.  Negative for dizziness and headaches.  Physical Exam   Blood pressure 109/72, pulse 81, height 5\' 6"  (1.676 m), weight 90.1 kg, last menstrual period 04/14/2021, SpO2 99 %, unknown if currently breastfeeding.  Patient Vitals for the past 24 hrs:  BP Pulse SpO2 Height Weight  05/22/22 1936 -- -- 99 % -- --  05/22/22 1931 -- -- 98 % -- --  05/22/22 1926 -- -- 99 % -- --  05/22/22 1921 -- -- 98 % -- --  05/22/22 1920 109/72 81 -- -- --  05/22/22 1916 -- -- 99 % -- --  05/22/22 1913 114/71 71 -- 5\' 6"  (1.676 m) 90.1 kg    Physical Exam Vitals and nursing note reviewed.  Constitutional:      General: She is not in acute distress.    Appearance: She is well-developed.  HENT:     Head: Normocephalic.  Eyes:     Pupils: Pupils are equal, round, and reactive to light.  Cardiovascular:      Rate and Rhythm: Normal rate and regular rhythm.     Heart sounds: Normal heart sounds.  Pulmonary:     Effort: Pulmonary effort is normal. No respiratory distress.     Breath sounds: Normal breath sounds.  Abdominal:     General: Bowel sounds are normal. There is no distension.     Palpations: Abdomen is soft.     Tenderness: There is no abdominal tenderness.  Skin:    General: Skin is warm and dry.  Neurological:     Mental Status: She is alert and oriented to person, place, and time.  Psychiatric:        Mood and Affect: Mood normal.        Behavior: Behavior normal.        Thought Content: Thought content normal.        Judgment: Judgment normal.     Fetal Tracing:  Baseline: 125 Variability: moderate Accels: 15x15 Decels: none  Toco: 2-10  Dilation: Fingertip Exam by:: Haynes Bast CNM   MAU Course  Procedures   MDM Prenatal records from community office reviewed. Pregnancy complicated by previous c/s, Rh Neg. Labs ordered and reviewed.   NST reactive [place] MFM OB limited CNM consulted with Dr. Harolyn Rutherford regarding presentation and results- MD recommends 23 hour observation due to direct abdominal trauma  Dr. Royston Sinner notified of patient arrival and fall- will admit to [facility] and Plan   1. Fall, initial encounter   2. [redacted] weeks gestation of pregnancy     -Admit to [facility] turned over to MD  Wende Mott, CNM 05/22/2022, 7:19 PM

## 2022-05-22 NOTE — MAU Note (Addendum)
.  Lori Rice is a 37 y.o. at [redacted]w[redacted]d here in MAU reporting: falling down two steps about one hour ago (1800), patient reports falling on stomach.   Patient denies VB, LOF and reports + FM.   Patient denies any pain at this time; just tightness in abdomen that comes and goes, patient reports [place] throughout pregnancy. Patient has abrasion on knees, and right thumb, not actively bleeding or causing pain at this time.   LMP: Patient's last menstrual period was 04/14/2021. Pain score: 0/10   FHT: Fetal Heart Rate Mode: External Baseline Rate (A): 140 bpm Multiple birth?: No OB Office: [facility] Lab orders placed from triage: N/A

## 2022-05-22 NOTE — Plan of Care (Signed)
  Problem: Education: Goal: Knowledge of disease or condition will improve Outcome: Progressing Goal: Knowledge of the prescribed therapeutic regimen will improve Outcome: Progressing Goal: Individualized Educational Video(s) Outcome: Progressing   Problem: Clinical Measurements: Goal: Complications related to the disease process, condition or treatment will be avoided or minimized Outcome: Progressing   Problem: Education: Goal: Knowledge of General Education information will improve Description: Including pain rating scale, medication(s)/side effects and non-pharmacologic comfort measures Outcome: Progressing   Problem: Health Behavior/Discharge Planning: Goal: Ability to manage health-related needs will improve Outcome: Progressing   Problem: Clinical Measurements: Goal: Ability to maintain clinical measurements within normal limits will improve Outcome: Progressing Goal: Will remain free from infection Outcome: Progressing Goal: Diagnostic test results will improve Outcome: Progressing Goal: Respiratory complications will improve Outcome: Progressing Goal: Cardiovascular complication will be avoided Outcome: Progressing   Problem: Activity: Goal: Risk for activity intolerance will decrease Outcome: Progressing   Problem: Nutrition: Goal: Adequate nutrition will be maintained Outcome: Progressing   Problem: Coping: Goal: Level of anxiety will decrease Outcome: Progressing   Problem: Elimination: Goal: Will not experience complications related to bowel motility Outcome: Progressing Goal: Will not experience complications related to urinary retention Outcome: Progressing   Problem: Pain Managment: Goal: General experience of comfort will improve Outcome: Progressing   Problem: Safety: Goal: Ability to remain free from injury will improve Outcome: Progressing   Problem: Skin Integrity: Goal: Risk for impaired skin integrity will decrease Outcome:  Progressing   

## 2022-05-23 DIAGNOSIS — W108XXA Fall (on) (from) other stairs and steps, initial encounter: Secondary | ICD-10-CM | POA: Diagnosis not present

## 2022-05-23 DIAGNOSIS — O9A213 Injury, poisoning and certain other consequences of external causes complicating pregnancy, third trimester: Secondary | ICD-10-CM | POA: Diagnosis not present

## 2022-05-23 DIAGNOSIS — Z3A35 35 weeks gestation of pregnancy: Secondary | ICD-10-CM | POA: Diagnosis not present

## 2022-05-23 NOTE — H&P (Addendum)
Lori Rice is a 37 y.o. female presenting for CEFM after a fall down two steps. This am, she reports she did catch herself with her hands but then they "gave out" and her abdomen was hit. Continues to feel FM and denies VB or LOF. Some tightening over night but no contractions/cramping.   OB History     Gravida  3   Para  1   Term  1   Preterm  0   AB  1   Living  1      SAB  1   IAB  0   Ectopic  0   Multiple  0   Live Births  1          Past Medical History:  Diagnosis Date   Anxiety    Bradycardia    Depression    Migraines    Mononucleosis    Palpitations    Tachycardia    Past Surgical History:  Procedure Laterality Date   CESAREAN SECTION N/A 12/16/2019   Procedure: CESAREAN SECTION;  Surgeon: Louretta Shorten, MD;  Location: [facility] LD ORS;  Service: Obstetrics;  Laterality: N/A;   LAPAROSCOPIC APPENDECTOMY N/A 07/21/2012   Procedure: APPENDECTOMY LAPAROSCOPIC;  Surgeon: Haywood Lasso, MD;  Location: [facility] ORS;  Service: General;  Laterality: N/A;   TONSILLECTOMY     TONSILLECTOMY     Family History: family history includes Bladder Cancer in her maternal grandfather; Breast cancer in her mother and paternal grandmother; Heart disease in her maternal grandfather and paternal grandfather; Heart failure in her maternal grandfather; Hypertension in her maternal grandfather and maternal grandmother; Lung cancer in her paternal grandfather; Stroke in her maternal grandfather. Social History:  reports that she has never smoked. She has never used smokeless tobacco. She reports that she does not currently use alcohol. She reports that she does not use drugs.     Maternal Diabetes: No Genetic Screening: Normal Maternal Ultrasounds/Referrals: Normal Fetal Ultrasounds or other Referrals:  None Maternal Substance Abuse:  No Significant Maternal Medications:  None Significant Maternal Lab Results:  None Number of Prenatal Visits:greater than 3 verified prenatal  visits Other Comments:  None  Review of Systems History Dilation: Fingertip Exam by:: C Neill CNM Blood pressure 96/60, pulse (!) 59, temperature 97.9 F (36.6 C), temperature source Oral, resp. rate 16, height 5\' 6"  (1.676 m), weight 90.1 kg, last menstrual period 04/14/2021, SpO2 98 %, unknown if currently breastfeeding. Exam Physical Exam  NAD, A&O NWOB Abd soft, nondistended, gravid, acontractile during my entire interaction FHT cat 1, toco with contractions every 10-15 minutes, not regular  Prenatal labs: ABO, Rh: --/--/A NEG (03/22 2059) Antibody: NEG (03/22 2059) Rubella:   RPR:    HBsAg:    HIV:    GBS:     Assessment/Plan: 37 yo G3P1011 presenting s/p fall. Has been monitored overnight after a fall. Has had reassuring [place] and fetal monitoring. Patient without s/s abruption. RH negative but s/p rhogam at appropriate time and no vaginal bleeding or s/s abruption. Discharge later today.     Tyson Dense 05/23/2022, 8:27 AM

## 2022-05-23 NOTE — Discharge Summary (Addendum)
     Postpartum Discharge Summary     Patient Name: Chrishawn Belle DOB: May 06, 1985 MRN: [id number]  Date of admission: 05/22/2022 Delivery date:This patient has no babies on file. Delivering provider: This patient has no babies on file. Date of discharge: 05/23/2022  Admitting diagnosis: Fall [W19.XXXA] Intrauterine pregnancy: [redacted]w[redacted]d     Secondary diagnosis:  Principal Problem:   Fall  Additional problems: None    Discharge diagnosis:  None                                               Hospital course: 37 yo G3P1011 presented after a fall. Normal [place], reassuring monitoring, and was meeting all goals.   Physical exam  Vitals:   05/22/22 2021 05/22/22 2109 05/23/22 0537 05/23/22 0745  BP:  126/80 (!) 91/50 96/60  Pulse:  69 67 (!) 59  Resp:  19 18 16   Temp:  97.8 F (36.6 C) 98 F (36.7 C) 97.9 F (36.6 C)  TempSrc:  Oral Oral Oral  SpO2: 99% 99% 96% 98%  Weight:      Height:       General: alert, cooperative, and no distress Uterine Fundus: gravid, nontender, soft, no bruising DVT Evaluation: No evidence of DVT seen on physical exam.   Labs: Lab Results  Component Value Date   WBC 8.2 02/11/2022   HGB 12.2 02/11/2022   HCT 35.8 (L) 02/11/2022   MCV 89.7 02/11/2022   PLT 159 02/11/2022      Latest Ref Rng & Units 02/11/2022    7:16 PM  CMP  Glucose 70 - 99 mg/dL 78   BUN 6 - 20 mg/dL 8   Creatinine 0.44 - 1.00 mg/dL 0.64   Sodium 135 - 145 mmol/L 138   Potassium 3.5 - 5.1 mmol/L 4.2   Chloride 98 - 111 mmol/L 108   CO2 22 - 32 mmol/L 23   Calcium 8.9 - 10.3 mg/dL 8.8   Total Protein 6.5 - 8.1 g/dL 6.2   Total Bilirubin 0.3 - 1.2 mg/dL 0.6   Alkaline Phos 38 - 126 U/L 52   AST 15 - 41 U/L 19   ALT 0 - 44 U/L 13    Edinburgh Score:     No data to display            After visit meds:  Allergies as of 05/23/2022   No Known Allergies      Medication List     STOP taking these medications    Adzenys XR-ODT 9.4 MG Tbed Generic drug:  Amphetamine ER   butalbital-acetaminophen-caffeine 50-325-40 MG tablet Commonly known as: FIORICET       TAKE these medications    escitalopram 10 MG tablet Commonly known as: LEXAPRO TAKE ONE TABLET BY MOUTH ONE TIME DAILY   multivitamin-prenatal 27-0.8 MG Tabs tablet Take 1 tablet by mouth daily at 12 noon.         Discharge home in stable condition IFuture Appointments: Future Appointments  Date Time Provider [place]  09/30/2022 10:20 AM Wendie Agreste, MD [id number]   Follow up Visit:      05/23/2022 Tyson Dense, MD

## 2022-05-26 DIAGNOSIS — G43719 Chronic migraine without aura, intractable, without status migrainosus: Secondary | ICD-10-CM | POA: Diagnosis not present

## 2022-05-27 DIAGNOSIS — Z3400 Encounter for supervision of normal first pregnancy, unspecified trimester: Secondary | ICD-10-CM | POA: Diagnosis not present

## 2022-05-27 DIAGNOSIS — Z3A37 37 weeks gestation of pregnancy: Secondary | ICD-10-CM | POA: Diagnosis not present

## 2022-06-06 ENCOUNTER — Inpatient Hospital Stay (HOSPITAL_COMMUNITY)
Admission: AD | Admit: 2022-06-06 | Discharge: 2022-06-07 | Disposition: A | Discharge status: Home / Self Care | Payer: BC Managed Care – PPO | Attending: Obstetrics and Gynecology | Admitting: Obstetrics and Gynecology

## 2022-06-06 ENCOUNTER — Encounter (HOSPITAL_COMMUNITY): Payer: Self-pay | Admitting: Obstetrics and Gynecology

## 2022-06-06 DIAGNOSIS — Z3A38 38 weeks gestation of pregnancy: Secondary | ICD-10-CM | POA: Insufficient documentation

## 2022-06-06 DIAGNOSIS — O471 False labor at or after 37 completed weeks of gestation: Secondary | ICD-10-CM | POA: Insufficient documentation

## 2022-06-06 DIAGNOSIS — Z0371 Encounter for suspected problem with amniotic cavity and membrane ruled out: Secondary | ICD-10-CM

## 2022-06-06 DIAGNOSIS — Z3A39 39 weeks gestation of pregnancy: Secondary | ICD-10-CM

## 2022-06-06 LAB — POCT FERN TEST: POCT Fern Test: NEGATIVE

## 2022-06-06 NOTE — MAU Provider Note (Signed)
Event Date/Time   First Provider Initiated Contact with Patient 06/06/22 2334       S: Ms. Lori Rice is a 37 y.o. G3P1011 at [redacted]w[redacted]d  who presents to MAU today complaining of leaking of fluid since this evening. She denies vaginal bleeding. She denies contractions. She reports normal fetal movement.    O: BP 117/75 (BP Location: Right Arm)   Pulse 78   Temp 98.6 F (37 C) (Oral)   Resp 18   Ht 5\' 6"  (1.676 m)   Wt 91.6 kg   LMP 04/14/2021   SpO2 100%   BMI 32.59 kg/m  GENERAL: Well-developed, well-nourished female in no acute distress.  HEAD: Normocephalic, atraumatic.  CHEST: Normal effort of breathing, regular heart rate ABDOMEN: Soft, nontender, gravid PELVIC: Normal external female genitalia. Vagina is pink and rugated. Cervix with normal contour, no lesions. Normal discharge.  No pooling.   Cervical exam:      Fetal Monitoring: Baseline: 130 Variability: moderate Accelerations: 15x15 Decelerations: no Contractions: Q 3-6 mins  Results for orders placed or performed during the hospital encounter of 06/06/22 (from the past 24 hour(s))  POCT fern test     Status: Normal   Collection Time: 06/06/22 11:51 PM  Result Value Ref Range   POCT Fern Test Negative = intact amniotic membranes   Amnisure rupture of membrane (rom)not at [facility]     Status: None   Collection Time: 06/07/22  1:05 AM  Result Value Ref Range   Amnisure ROM NEGATIVE      A: SIUP at [redacted]w[redacted]d  Membranes intact  P: Discharge home Labor precautions Keep f/u with OB  Judeth Horn, NP 06/07/2022 1:31 AM

## 2022-06-06 NOTE — MAU Note (Signed)
.  Lori Rice is a 37 y.o. at [redacted]w[redacted]d here in MAU reporting: she noticed two gushes of clear, watery fluid around 2100 tonight. She has been wearing a pad since. She reports some irregular Braxton Hick's ctx (3/10).   Pain score: Pain Assessment Pain Assessment: 0-10 Pain Score: 3  Pain Location: Abdomen Pain Descriptors / Indicators: Contraction Pain Frequency: Intermittent Pain Onset: On-going  ROM: Membranes Sac Identifier: Sac 1 Membrane Status: Possible ROM - for evaluation Color: Clear Odor: Normal Amount: Small  Vaginal Bleeding:  Denies VB.   Fetal Movement: Reports positive FM  FHT: Fetal Heart Rate Mode: Doppler Baseline Rate (A): 129 bpm Multiple birth?: No  Vitals:   06/06/22 2253  BP: 117/75  Pulse: 78  Resp: 18  Temp: 98.6 F (37 C)  SpO2: 100%       OB Office: Phy 4 Women GBS: Negative HSV: Denies hx of HSV Lab orders placed from triage: MAU Labor Eval

## 2022-06-07 DIAGNOSIS — Z3A39 39 weeks gestation of pregnancy: Secondary | ICD-10-CM | POA: Diagnosis not present

## 2022-06-07 DIAGNOSIS — Z0371 Encounter for suspected problem with amniotic cavity and membrane ruled out: Secondary | ICD-10-CM | POA: Diagnosis not present

## 2022-06-07 DIAGNOSIS — O471 False labor at or after 37 completed weeks of gestation: Secondary | ICD-10-CM | POA: Diagnosis not present

## 2022-06-07 DIAGNOSIS — Z3A38 38 weeks gestation of pregnancy: Secondary | ICD-10-CM | POA: Diagnosis not present

## 2022-06-07 LAB — AMNISURE RUPTURE OF MEMBRANE (ROM) NOT AT ARMC: Amnisure ROM: NEGATIVE

## 2022-06-09 ENCOUNTER — Telehealth (HOSPITAL_COMMUNITY): Payer: Self-pay | Admitting: *Deleted

## 2022-06-11 ENCOUNTER — Inpatient Hospital Stay (HOSPITAL_COMMUNITY): Payer: BC Managed Care – PPO | Admitting: Anesthesiology

## 2022-06-11 ENCOUNTER — Encounter (HOSPITAL_COMMUNITY): Payer: Self-pay | Admitting: Obstetrics & Gynecology

## 2022-06-11 ENCOUNTER — Inpatient Hospital Stay (EMERGENCY_DEPARTMENT_HOSPITAL)
Admission: AD | Admit: 2022-06-11 | Discharge: 2022-06-11 | Disposition: A | Discharge status: Home / Self Care | Payer: BC Managed Care – PPO | Source: Home / Self Care | Attending: Obstetrics and Gynecology | Admitting: Obstetrics and Gynecology

## 2022-06-11 ENCOUNTER — Inpatient Hospital Stay (HOSPITAL_COMMUNITY)
Admission: AD | Admit: 2022-06-11 | Discharge: 2022-06-13 | DRG: 807 | Disposition: A | Discharge status: Home / Self Care | Payer: BC Managed Care – PPO | Attending: Obstetrics & Gynecology | Admitting: Obstetrics & Gynecology

## 2022-06-11 ENCOUNTER — Encounter (HOSPITAL_COMMUNITY): Payer: Self-pay | Admitting: Obstetrics and Gynecology

## 2022-06-11 ENCOUNTER — Other Ambulatory Visit: Payer: Self-pay

## 2022-06-11 DIAGNOSIS — O479 False labor, unspecified: Secondary | ICD-10-CM | POA: Diagnosis not present

## 2022-06-11 DIAGNOSIS — Z821 Family history of blindness and visual loss: Secondary | ICD-10-CM | POA: Diagnosis not present

## 2022-06-11 DIAGNOSIS — O34219 Maternal care for unspecified type scar from previous cesarean delivery: Secondary | ICD-10-CM | POA: Diagnosis not present

## 2022-06-11 DIAGNOSIS — O99214 Obesity complicating childbirth: Secondary | ICD-10-CM | POA: Diagnosis present

## 2022-06-11 DIAGNOSIS — Z3689 Encounter for other specified antenatal screening: Secondary | ICD-10-CM | POA: Diagnosis not present

## 2022-06-11 DIAGNOSIS — Z83518 Family history of other specified eye disorder: Secondary | ICD-10-CM | POA: Diagnosis not present

## 2022-06-11 DIAGNOSIS — Z3A39 39 weeks gestation of pregnancy: Secondary | ICD-10-CM | POA: Diagnosis not present

## 2022-06-11 DIAGNOSIS — O471 False labor at or after 37 completed weeks of gestation: Secondary | ICD-10-CM | POA: Insufficient documentation

## 2022-06-11 DIAGNOSIS — Q5569 Other congenital malformation of penis: Secondary | ICD-10-CM | POA: Diagnosis not present

## 2022-06-11 DIAGNOSIS — R011 Cardiac murmur, unspecified: Secondary | ICD-10-CM | POA: Diagnosis not present

## 2022-06-11 DIAGNOSIS — O99344 Other mental disorders complicating childbirth: Secondary | ICD-10-CM | POA: Diagnosis not present

## 2022-06-11 DIAGNOSIS — F418 Other specified anxiety disorders: Secondary | ICD-10-CM | POA: Diagnosis not present

## 2022-06-11 DIAGNOSIS — O26893 Other specified pregnancy related conditions, third trimester: Secondary | ICD-10-CM | POA: Diagnosis not present

## 2022-06-11 DIAGNOSIS — Z3490 Encounter for supervision of normal pregnancy, unspecified, unspecified trimester: Principal | ICD-10-CM

## 2022-06-11 DIAGNOSIS — O34211 Maternal care for low transverse scar from previous cesarean delivery: Secondary | ICD-10-CM | POA: Diagnosis not present

## 2022-06-11 DIAGNOSIS — Z23 Encounter for immunization: Secondary | ICD-10-CM | POA: Diagnosis not present

## 2022-06-11 LAB — CBC
HCT: 35.9 % — ABNORMAL LOW (ref 36.0–46.0)
Hemoglobin: 12.5 g/dL (ref 12.0–15.0)
MCH: 30.2 pg (ref 26.0–34.0)
MCHC: 34.8 g/dL (ref 30.0–36.0)
MCV: 86.7 fL (ref 80.0–100.0)
Platelets: 140 10*3/uL — ABNORMAL LOW (ref 150–400)
RBC: 4.14 MIL/uL (ref 3.87–5.11)
RDW: 12.8 % (ref 11.5–15.5)
WBC: 12.9 10*3/uL — ABNORMAL HIGH (ref 4.0–10.5)
nRBC: 0 % (ref 0.0–0.2)

## 2022-06-11 LAB — TYPE AND SCREEN
ABO/RH(D): A NEG
Antibody Screen: NEGATIVE

## 2022-06-11 LAB — HIV ANTIBODY (ROUTINE TESTING W REFLEX): HIV Screen 4th Generation wRfx: NONREACTIVE

## 2022-06-11 MED ORDER — OXYTOCIN-SODIUM CHLORIDE 30-0.9 UT/500ML-% IV SOLN: 2.5000 [IU]/h | INTRAVENOUS | Status: DC

## 2022-06-11 MED ORDER — OXYCODONE-ACETAMINOPHEN 5-325 MG PO TABS: 2.0000 | ORAL_TABLET | ORAL | Status: DC | PRN

## 2022-06-11 MED ORDER — OXYCODONE-ACETAMINOPHEN 5-325 MG PO TABS: 1.0000 | ORAL_TABLET | ORAL | Status: DC | PRN

## 2022-06-11 MED ORDER — ZOLPIDEM TARTRATE 5 MG PO TABS: 5.0000 mg | ORAL_TABLET | Freq: Every evening | ORAL | Status: DC | PRN

## 2022-06-11 MED ORDER — LACTATED RINGERS IV SOLN: 500.0000 mL | Freq: Once | INTRAVENOUS | Status: DC

## 2022-06-11 MED ORDER — ESCITALOPRAM OXALATE 10 MG PO TABS
10.0000 mg | ORAL_TABLET | Freq: Every day | ORAL | Status: DC
  Filled 2022-06-11: qty 1

## 2022-06-11 MED ORDER — FENTANYL CITRATE (PF) 100 MCG/2ML IJ SOLN
50.0000 ug | INTRAMUSCULAR | Status: DC | PRN
  Administered 2022-06-11 (×2): 100 ug via INTRAVENOUS
  Filled 2022-06-11 (×2): qty 2

## 2022-06-11 MED ORDER — PHENYLEPHRINE 80 MCG/ML (10ML) SYRINGE FOR IV PUSH (FOR BLOOD PRESSURE SUPPORT): 80.0000 ug | PREFILLED_SYRINGE | INTRAVENOUS | Status: DC | PRN

## 2022-06-11 MED ORDER — PROMETHAZINE HCL 25 MG/ML IJ SOLN
25.0000 mg | Freq: Once | INTRAMUSCULAR | Status: AC
  Administered 2022-06-11: 25 mg via INTRAMUSCULAR
  Filled 2022-06-11: qty 1

## 2022-06-11 MED ORDER — LIDOCAINE HCL (PF) 1 % IJ SOLN: 30.0000 mL | INTRAMUSCULAR | Status: DC | PRN

## 2022-06-11 MED ORDER — ONDANSETRON HCL 4 MG/2ML IJ SOLN: 4.0000 mg | Freq: Four times a day (QID) | INTRAMUSCULAR | Status: DC | PRN

## 2022-06-11 MED ORDER — EPHEDRINE 5 MG/ML INJ: 10.0000 mg | INTRAVENOUS | Status: DC | PRN

## 2022-06-11 MED ORDER — SOD CITRATE-CITRIC ACID 500-334 MG/5ML PO SOLN: 30.0000 mL | ORAL | Status: DC | PRN

## 2022-06-11 MED ORDER — LACTATED RINGERS IV SOLN: INTRAVENOUS | Status: DC

## 2022-06-11 MED ORDER — TERBUTALINE SULFATE 1 MG/ML IJ SOLN: 0.2500 mg | Freq: Once | INTRAMUSCULAR | Status: DC | PRN

## 2022-06-11 MED ORDER — DIPHENHYDRAMINE HCL 50 MG/ML IJ SOLN: 12.5000 mg | INTRAMUSCULAR | Status: DC | PRN

## 2022-06-11 MED ORDER — ACETAMINOPHEN 325 MG PO TABS: 650.0000 mg | ORAL_TABLET | ORAL | Status: DC | PRN

## 2022-06-11 MED ORDER — LACTATED RINGERS IV SOLN: 500.0000 mL | INTRAVENOUS | Status: DC | PRN

## 2022-06-11 MED ORDER — MORPHINE SULFATE (PF) 10 MG/ML IV SOLN
10.0000 mg | Freq: Once | INTRAVENOUS | Status: AC
  Administered 2022-06-11: 10 mg via INTRAMUSCULAR
  Filled 2022-06-11: qty 1

## 2022-06-11 MED ORDER — FENTANYL-BUPIVACAINE-NACL 0.5-0.125-0.9 MG/250ML-% EP SOLN
12.0000 mL/h | EPIDURAL | Status: DC | PRN
  Administered 2022-06-11: 12 mL/h via EPIDURAL
  Filled 2022-06-11: qty 250

## 2022-06-11 MED ORDER — OXYTOCIN BOLUS FROM INFUSION
333.0000 mL | Freq: Once | INTRAVENOUS | Status: AC
  Administered 2022-06-11: 333 mL via INTRAVENOUS

## 2022-06-11 MED ORDER — LIDOCAINE HCL (PF) 1 % IJ SOLN
INTRAMUSCULAR | Status: DC | PRN
  Administered 2022-06-11 (×2): 5 mL via EPIDURAL

## 2022-06-11 MED ORDER — OXYTOCIN-SODIUM CHLORIDE 30-0.9 UT/500ML-% IV SOLN
1.0000 m[IU]/min | INTRAVENOUS | Status: DC
  Filled 2022-06-11: qty 500

## 2022-06-11 NOTE — MAU Note (Signed)
.  Lori Rice is a 37 y.o. at [redacted]w[redacted]d here in MAU reporting:   Contractions every: 4 minutes 0300 Onset of ctx: Today Pain score: 7/10  ROM: Intact Vaginal Bleeding: Bloody show Last SVE: 1cm Membrane sweep on Tuesday 4/09 No recent intercourse, denies complications  Epidural: Planning Desires TOLAC Fetal Movement: Reports positive FM FHT:120 via External  Vitals:   06/11/22 0601  BP: 116/68  Pulse: 76  Resp: 18  SpO2: 100%       OB Office: Phy 4 Women GBS: Negative HSV: Denies hx of HSV Lab orders placed from triage: MAU Labor Eval'

## 2022-06-11 NOTE — MAU Provider Note (Addendum)
  S: Ms. Aneyah Molis is a 37 y.o. G3P1011 at [redacted]w[redacted]d  who presents to MAU today complaining contractions q 4 minutes since 0300. She denies vaginal bleeding. She denies LOF. She reports normal fetal movement.    O: BP (!) 101/59 (BP Location: Right Arm)   Pulse 65   Temp 98.2 F (36.8 C) (Oral)   Resp 17   Ht 5\' 6"  (1.676 m)   Wt 90.6 kg   LMP 04/14/2021   SpO2 100%   BMI 32.23 kg/m  GENERAL: Well-developed, well-nourished female, tearful but appears comfortable HEAD: Normocephalic, atraumatic.  CHEST: Normal effort of breathing, regular heart rate ABDOMEN: Soft, nontender, gravid  Cervical exam:  Dilation: 1.5 Effacement (%): 50 Cervical Position: Middle Station: -2 Presentation: Vertex Exam by:: K. Cowher RN  Fetal Monitoring: Baseline: 125 Variability: mod Accelerations: + Decelerations: no Contractions: 4-6  Prenatal records reviewed: hx of CS, planning TOLAC, anxiety, depression, and Rh negative.   MAU Course: Morphine Phenergan  MDM: no cervical change after 2 rechecks. Pt concerned about going home that she won't know when to come back or how to manage the pain at home. Discussed in detail signs of active labor. She is upset because she is on the list for eIOL today. I discussed the process for eIOL and priority given to medically indicated IOL, and they will call her in if bed available. I confirmed she is still planning TOLAC, and this is what she prefers. I offered analgesic prior to discharge and she accepts. She is stable for discharge.  A: SIUP at [redacted]w[redacted]d  False labor Reactive NST  P: Discharge home Follow up with OB Labor precautions FMCs  Allergies as of 06/11/2022   No Known Allergies      Medication List     TAKE these medications    escitalopram 10 MG tablet Commonly known as: LEXAPRO TAKE ONE TABLET BY MOUTH ONE TIME DAILY   multivitamin-prenatal 27-0.8 MG Tabs tablet Take 1 tablet by mouth daily at 12 noon.       Donette Larry, CNM 06/11/2022 9:03 AM

## 2022-06-11 NOTE — MAU Note (Signed)
p\t here earlier for labor sent home 1.5cm dilated. Back now ctx are more intense. Pt is crying and inconsolable. Assisted to bed and checked cervix.

## 2022-06-11 NOTE — Anesthesia Procedure Notes (Signed)
Epidural Patient location during procedure: OB Start time: 06/11/2022 5:19 PM End time: 06/11/2022 5:27 PM  Staffing Anesthesiologist: Mal Amabile, MD Performed: anesthesiologist   Preanesthetic Checklist Completed: patient identified, IV checked, site marked, risks and benefits discussed, surgical consent, monitors and equipment checked, pre-op evaluation and timeout performed  Epidural Patient position: sitting Prep: DuraPrep and site prepped and draped Patient monitoring: continuous pulse ox and blood pressure Approach: midline Location: L3-L4 Injection technique: LOR air  Needle:  Needle type: Tuohy  Needle gauge: 17 G Needle length: 9 cm and 9 Needle insertion depth: 4 cm Catheter type: closed end flexible Catheter size: 19 Gauge Catheter at skin depth: 9 cm Test dose: negative and Other  Assessment Events: blood not aspirated, no cerebrospinal fluid, injection not painful, no injection resistance, no paresthesia and negative IV test  Additional Notes Patient identified. Risks and benefits discussed including failed block, incomplete  Pain control, post dural puncture headache, nerve damage, paralysis, blood pressure Changes, nausea, vomiting, reactions to medications-both toxic and allergic and post Partum back pain. All questions were answered. Patient expressed understanding and wished to proceed. Sterile technique was used throughout procedure. Epidural site was Dressed with sterile barrier dressing. No paresthesias, signs of intravascular injection Or signs of intrathecal spread were encountered.  Patient was more comfortable after the epidural was dosed. Please see RN's note for documentation of vital signs and FHR which are stable. Reason for block:procedure for pain

## 2022-06-11 NOTE — Progress Notes (Signed)
Sheppard Coil RN reported to me Delfino Lovett RN at [phone]. No EFM tracing charting from 0630-0700. I will resume EFM charting from 0700-0730.  FHR appears to be reactive with accelerations from 0630-0700 with contractions every 5-7 minutes.

## 2022-06-11 NOTE — Anesthesia Preprocedure Evaluation (Addendum)
Anesthesia Evaluation  Patient identified by MRN, date of birth, ID band Patient awake    Reviewed: Allergy & Precautions, H&P , NPO status , Patient's Chart, lab work & pertinent test results  Airway Mallampati: II  TM Distance: >3 FB Neck ROM: Full    Dental no notable dental hx.    Pulmonary neg pulmonary ROS, shortness of breath and with exertion   Pulmonary exam normal breath sounds clear to auscultation       Cardiovascular negative cardio ROS Normal cardiovascular exam Rhythm:Regular Rate:Normal     Neuro/Psych  Headaches PSYCHIATRIC DISORDERS Anxiety Depression    negative neurological ROS  negative psych ROS   GI/Hepatic negative GI ROS, Neg liver ROS,GERD  ,,  Endo/Other  negative endocrine ROS  Obesity  Renal/GU negative Renal ROS  negative genitourinary   Musculoskeletal negative musculoskeletal ROS (+)    Abdominal   Peds negative pediatric ROS (+)  Hematology negative hematology ROS (+) Blood dyscrasia, anemia Thrombocytopenia- Plt 140k   Anesthesia Other Findings   Reproductive/Obstetrics negative OB ROS (+) Pregnancy Hx/o previous C/Section                             Anesthesia Physical Anesthesia Plan  ASA: 2  Anesthesia Plan: Epidural   Post-op Pain Management:    Induction:   PONV Risk Score and Plan:   Airway Management Planned: Natural Airway  Additional Equipment: Fetal Monitoring  Intra-op Plan:   Post-operative Plan:   Informed Consent: I have reviewed the patients History and Physical, chart, labs and discussed the procedure including the risks, benefits and alternatives for the proposed anesthesia with the patient or authorized representative who has indicated his/her understanding and acceptance.     Dental Advisory Given  Plan Discussed with: Anesthesiologist  Anesthesia Plan Comments: (Labs checked- platelets confirmed with RN in room.  Fetal heart tracing, per RN, reported to be stable enough for sitting procedure. Discussed epidural, and patient consents to the procedure:  included risk of possible headache,backache, failed block, allergic reaction, and nerve injury. This patient was asked if she had any questions or concerns before the procedure started.)       Anesthesia Quick Evaluation

## 2022-06-11 NOTE — H&P (Signed)
Lori Rice is a 36 y.o. female G3P1011 at [redacted]w[redacted]d presenting for labor/TOLAC.  Patient's contractions started at 3 am and have intensified with time.  Active FM.  No VB.  Antepartum course complicated by C/S for NRFHT.  She has anxiety and depression which has been well controlled on escitalopram 10 mg daily.  H/O migraine.  GBS negative.  Currently comfortable with CLEA.  OB History     Gravida  3   Para  1   Term  1   Preterm  0   AB  1   Living  1      SAB  1   IAB  0   Ectopic  0   Multiple  0   Live Births  1          Past Medical History:  Diagnosis Date   Anxiety    Bradycardia    Depression    Migraines    Mononucleosis    Palpitations    Tachycardia    Past Surgical History:  Procedure Laterality Date   CESAREAN SECTION N/A 12/16/2019   Procedure: CESAREAN SECTION;  Surgeon: Candice Camp, MD;  Location: [facility]S;  Service: Obstetrics;  Laterality: N/A;   LAPAROSCOPIC APPENDECTOMY N/A 07/21/2012   Procedure: APPENDECTOMY LAPAROSCOPIC;  Surgeon: Currie Paris, MD;  Location: [facility] ORS;  Service: General;  Laterality: N/A;   TONSILLECTOMY     TONSILLECTOMY     Family History: family history includes Bladder Cancer in her maternal grandfather; Breast cancer in her mother and paternal grandmother; Heart disease in her maternal grandfather and paternal grandfather; Heart failure in her maternal grandfather; Hypertension in her maternal grandfather and maternal grandmother; Lung cancer in her paternal grandfather; Stroke in her maternal grandfather. Social History:  reports that she has never smoked. She has never used smokeless tobacco. She reports that she does not currently use alcohol. She reports that she does not use drugs.     Maternal Diabetes: No Genetic Screening: Normal Maternal Ultrasounds/Referrals: Normal Fetal Ultrasounds or other Referrals:  None Maternal Substance Abuse:  No Significant Maternal Medications:  Meds include: Other:  escitalopram Significant Maternal Lab Results:  Group B Strep negative Number of Prenatal Visits:greater than 3 verified prenatal visits Other Comments:  None  Review of Systems Maternal Medical History:  Reason for admission: Contractions.   Contractions: Onset was 13-24 hours ago.   Frequency: regular.   Perceived severity is moderate.   Fetal activity: Perceived fetal activity is normal.   Last perceived fetal movement was within the past hour.   Prenatal complications: no prenatal complications Prenatal Complications - Diabetes: none.   Dilation: 5 Effacement (%): 70 Station: -2 Exam by:: Dr. Langston Masker Blood pressure 102/68, pulse 63, temperature 98.3 F (36.8 C), temperature source Oral, resp. rate 18, height 5\' 6"  (1.676 m), weight 90.3 kg, last menstrual period 04/14/2021, unknown if currently breastfeeding. Maternal Exam:  Uterine Assessment: Contraction strength is moderate.  Contraction frequency is regular.  Abdomen: Patient reports no abdominal tenderness. Surgical scars: low transverse.   Fundal height is c/w dates.   Estimated fetal weight is 8#.   Fetal presentation: vertex Introitus: Normal vulva. Amniotic fluid character: clear. Pelvis: adequate for delivery.   Cervix: Cervix evaluated by digital exam.     Fetal Exam Fetal Monitor Review: Baseline rate: 120.  Variability: moderate (6-25 bpm).   Pattern: accelerations present and no decelerations.   Fetal State Assessment: Category I - tracings are normal.   Physical Exam Constitutional:  Appearance: Normal appearance.  HENT:     Head: Normocephalic and atraumatic.  Pulmonary:     Effort: Pulmonary effort is normal.  Abdominal:     Palpations: Abdomen is soft.  Genitourinary:    General: Normal vulva.  Musculoskeletal:        General: Normal range of motion.     Cervical back: Normal range of motion.  Skin:    General: Skin is warm and dry.  Neurological:     Mental Status: She is alert and  oriented to person, place, and time.  Psychiatric:        Mood and Affect: Mood normal.        Behavior: Behavior normal.     Prenatal labs: ABO, Rh: --/--/A NEG (04/11 1538) Antibody: NEG (04/11 1538) Rubella: Immune (09/19 0000) RPR: Nonreactive (09/19 0000)  HBsAg: Negative (09/19 0000)  HIV: Non-reactive (09/19 0000)  GBS: Negative/-- (03/18 0000)   Assessment/Plan: 36yo G3P1011 at [redacted]w[redacted]d with labor/TOLAC -Augment prn with pitocin -Anticipate NSVD/VBAC -Patient has been counseled re: risk of uterine rupture with associated maternal hemorrhage and need for C hyst as well as fetal morbidity and mortality.  She strongly desires TOLAC.  [name] 06/11/2022, 6:28 PM

## 2022-06-11 NOTE — Plan of Care (Signed)
  Problem: Education: Goal: Knowledge of General Education information will improve Description: Including pain rating scale, medication(s)/side effects and non-pharmacologic comfort measures Outcome: Progressing   Problem: Health Behavior/Discharge Planning: Goal: Ability to manage health-related needs will improve Outcome: Progressing   Problem: Clinical Measurements: Goal: Ability to maintain clinical measurements within normal limits will improve Outcome: Progressing Goal: Will remain free from infection Outcome: Progressing Goal: Diagnostic test results will improve Outcome: Progressing Goal: Respiratory complications will improve Outcome: Progressing Goal: Cardiovascular complication will be avoided Outcome: Progressing   Problem: Activity: Goal: Risk for activity intolerance will decrease Outcome: Progressing   Problem: Nutrition: Goal: Adequate nutrition will be maintained Outcome: Progressing   Problem: Coping: Goal: Level of anxiety will decrease Outcome: Progressing   Problem: Elimination: Goal: Will not experience complications related to bowel motility Outcome: Progressing Goal: Will not experience complications related to urinary retention Outcome: Progressing   Problem: Pain Managment: Goal: General experience of comfort will improve Outcome: Progressing   Problem: Safety: Goal: Ability to remain free from injury will improve Outcome: Progressing   Problem: Skin Integrity: Goal: Risk for impaired skin integrity will decrease Outcome: Progressing   Problem: Education: Goal: Knowledge of disease or condition will improve Outcome: Progressing Goal: Knowledge of the prescribed therapeutic regimen will improve Outcome: Progressing Goal: Individualized Educational Video(s) Outcome: Progressing   Problem: Clinical Measurements: Goal: Complications related to the disease process, condition or treatment will be avoided or minimized Outcome:  Progressing   Problem: Education: Goal: Knowledge of Childbirth will improve Outcome: Progressing Goal: Ability to make informed decisions regarding treatment and plan of care will improve Outcome: Progressing Goal: Ability to state and carry out methods to decrease the pain will improve Outcome: Progressing Goal: Individualized Educational Video(s) Outcome: Progressing   Problem: Coping: Goal: Ability to verbalize concerns and feelings about labor and delivery will improve Outcome: Progressing   Problem: Life Cycle: Goal: Ability to make normal progression through stages of labor will improve Outcome: Progressing Goal: Ability to effectively push during vaginal delivery will improve Outcome: Progressing   Problem: Role Relationship: Goal: Will demonstrate positive interactions with the child Outcome: Progressing   Problem: Safety: Goal: Risk of complications during labor and delivery will decrease Outcome: Progressing   Problem: Pain Management: Goal: Relief or control of pain from uterine contractions will improve Outcome: Progressing   

## 2022-06-12 DIAGNOSIS — O34219 Maternal care for unspecified type scar from previous cesarean delivery: Secondary | ICD-10-CM | POA: Diagnosis present

## 2022-06-12 LAB — CBC
HCT: 30.1 % — ABNORMAL LOW (ref 36.0–46.0)
Hemoglobin: 10.6 g/dL — ABNORMAL LOW (ref 12.0–15.0)
MCH: 30.5 pg (ref 26.0–34.0)
MCHC: 35.2 g/dL (ref 30.0–36.0)
MCV: 86.5 fL (ref 80.0–100.0)
Platelets: 147 10*3/uL — ABNORMAL LOW (ref 150–400)
RBC: 3.48 MIL/uL — ABNORMAL LOW (ref 3.87–5.11)
RDW: 12.9 % (ref 11.5–15.5)
WBC: 14.1 10*3/uL — ABNORMAL HIGH (ref 4.0–10.5)
nRBC: 0 % (ref 0.0–0.2)

## 2022-06-12 LAB — RPR: RPR Ser Ql: NONREACTIVE

## 2022-06-12 MED ORDER — TETANUS-DIPHTH-ACELL PERTUSSIS 5-2.5-18.5 LF-MCG/0.5 IM SUSY: 0.5000 mL | PREFILLED_SYRINGE | Freq: Once | INTRAMUSCULAR | Status: DC

## 2022-06-12 MED ORDER — DIBUCAINE (PERIANAL) 1 % EX OINT: 1.0000 | TOPICAL_OINTMENT | CUTANEOUS | Status: DC | PRN

## 2022-06-12 MED ORDER — ONDANSETRON HCL 4 MG PO TABS: 4.0000 mg | ORAL_TABLET | ORAL | Status: DC | PRN

## 2022-06-12 MED ORDER — COCONUT OIL OIL: 1.0000 | TOPICAL_OIL | Status: DC | PRN

## 2022-06-12 MED ORDER — BENZOCAINE-MENTHOL 20-0.5 % EX AERO
1.0000 | INHALATION_SPRAY | CUTANEOUS | Status: DC | PRN
  Administered 2022-06-12: 1 via TOPICAL
  Filled 2022-06-12: qty 56

## 2022-06-12 MED ORDER — IBUPROFEN 600 MG PO TABS
600.0000 mg | ORAL_TABLET | Freq: Four times a day (QID) | ORAL | Status: DC
  Administered 2022-06-12 – 2022-06-13 (×6): 600 mg via ORAL
  Filled 2022-06-12 (×6): qty 1

## 2022-06-12 MED ORDER — ZOLPIDEM TARTRATE 5 MG PO TABS: 5.0000 mg | ORAL_TABLET | Freq: Every evening | ORAL | Status: DC | PRN

## 2022-06-12 MED ORDER — SIMETHICONE 80 MG PO CHEW: 80.0000 mg | CHEWABLE_TABLET | ORAL | Status: DC | PRN

## 2022-06-12 MED ORDER — PRENATAL MULTIVITAMIN CH
1.0000 | ORAL_TABLET | Freq: Every day | ORAL | Status: DC
  Administered 2022-06-12: 1 via ORAL
  Filled 2022-06-12 (×2): qty 1

## 2022-06-12 MED ORDER — ONDANSETRON HCL 4 MG/2ML IJ SOLN: 4.0000 mg | INTRAMUSCULAR | Status: DC | PRN

## 2022-06-12 MED ORDER — SENNOSIDES-DOCUSATE SODIUM 8.6-50 MG PO TABS
2.0000 | ORAL_TABLET | Freq: Every day | ORAL | Status: DC
  Administered 2022-06-12 – 2022-06-13 (×2): 2 via ORAL
  Filled 2022-06-12 (×2): qty 2

## 2022-06-12 MED ORDER — WITCH HAZEL-GLYCERIN EX PADS: 1.0000 | MEDICATED_PAD | CUTANEOUS | Status: DC | PRN

## 2022-06-12 MED ORDER — ACETAMINOPHEN 325 MG PO TABS
650.0000 mg | ORAL_TABLET | ORAL | Status: DC | PRN
  Administered 2022-06-12 – 2022-06-13 (×2): 650 mg via ORAL
  Filled 2022-06-12 (×2): qty 2

## 2022-06-12 MED ORDER — ESCITALOPRAM OXALATE 10 MG PO TABS
10.0000 mg | ORAL_TABLET | Freq: Every day | ORAL | Status: DC
  Administered 2022-06-12: 10 mg via ORAL
  Filled 2022-06-12 (×2): qty 1

## 2022-06-12 MED ORDER — DIPHENHYDRAMINE HCL 25 MG PO CAPS: 25.0000 mg | ORAL_CAPSULE | Freq: Four times a day (QID) | ORAL | Status: DC | PRN

## 2022-06-12 NOTE — Progress Notes (Signed)
Post Partum Day 1 s/p VBAC Subjective: no complaints, up ad lib, voiding, and tolerating PO  Objective: Blood pressure 113/70, pulse 85, temperature 97.8 F (36.6 C), temperature source Oral, resp. rate 16, height 5\' 6"  (1.676 m), weight 90.3 kg, last menstrual period 04/14/2021, SpO2 97 %, unknown if currently breastfeeding.  Physical Exam:  General: alert Lochia: appropriate Uterine Fundus: firm Incision: N/A DVT Evaluation: No evidence of DVT seen on physical exam.  Recent Labs    06/11/22 1538  HGB 12.5  HCT 35.9*    Assessment/Plan: Plan for discharge tomorrow and Circumcision prior to discharge   LOS: 1 day   [name], MD 06/12/2022, 7:27 AM

## 2022-06-12 NOTE — Social Work (Signed)
MOB was referred for history of depression/anxiety.   * Referral screened out by Clinical Social Worker because none of the following criteria appear to apply:   ~ History of anxiety/depression during this pregnancy, or of post-partum depression following prior delivery.   ~ Diagnosis of anxiety and/or depression within last 3 years OR * MOB's symptoms currently being treated with medication and/or therapy. Per OB records "She has anxiety and depression which has been well controlled on escitalopram 10 mg daily."   Please contact the Clinical Social Worker if needs arise, by MOB request, or if MOB scores greater than 9/yes to question 10 on Edinburgh Postpartum Depression Screen.   Jahzeel Poythress, LCSWA Clinical Social Worker [phone] 

## 2022-06-12 NOTE — Lactation Note (Signed)
This note was copied from a baby's chart. Lactation Consultation Note  Patient Name: Lori Rice [id number] Date: 06/12/2022 Age:35 hours Reason for consult: Initial assessment;Term;Breastfeeding assistance  LC entered the room and the parents were putting clothes on the infant.  The birth parent stated that feedings are going well.  She breast fed her older child for 3 months but had to stop due to her migraine medication (Rizatriptan &Topiramate). The infant began showing feeding cues and the birth parent latched the infant to the left breast.  The infant latched with his tongue down, lips were flanged, sucking was rhythmic, and some swallows were noted.  LC did not get to finish the consult due to the MD entering the room.  LC will return later to provide information on migraine medications.  LC left her name on the board and exited the room.   Infant Feeding Plan:  Breastfeed 8+ times in 24 hours according to feeding cues.  Hand express and feed the expressed milk to the infant via spoon.  Call RN/LC for assistance with breastfeeding.  Maternal Data Does the patient have breastfeeding experience prior to this delivery?: Yes How long did the patient breastfeed?: She breast fed for 3 months   LATCH Score Latch: Grasps breast easily, tongue down, lips flanged, rhythmical sucking.  Audible Swallowing: Spontaneous and intermittent  Type of Nipple: Everted at rest and after stimulation  Comfort (Breast/Nipple): Soft / non-tender  Hold (Positioning): No assistance needed to correctly position infant at breast.  LATCH Score: 10   Interventions Interventions: Breast feeding basics reviewed;Assisted with latch;Adjust position;Support pillows;LC Services brochure;Education   Consult Status Consult Status: Follow-up Date: 06/13/22 Follow-up type: In-patient   Shital Crayton P Judas Mohammad 06/12/2022, 9:30 AM

## 2022-06-12 NOTE — Anesthesia Postprocedure Evaluation (Signed)
Anesthesia Post Note  Patient: Lori Rice  Procedure(s) Performed: AN AD HOC LABOR EPIDURAL     Patient location during evaluation: Mother Baby Anesthesia Type: Epidural Level of consciousness: awake, oriented and awake and alert Pain management: pain level controlled Vital Signs Assessment: post-procedure vital signs reviewed and stable Respiratory status: spontaneous breathing, respiratory function stable and nonlabored ventilation Cardiovascular status: stable Postop Assessment: no headache, adequate PO intake, able to ambulate, patient able to bend at knees and no apparent nausea or vomiting Anesthetic complications: no   No notable events documented.  Last Vitals:  Vitals:   06/12/22 0512 06/12/22 0850  BP: 113/70 119/70  Pulse: 85 64  Resp: 16 16  Temp: 36.6 C 36.4 C  SpO2:  98%    Last Pain:  Vitals:   06/12/22 0850  TempSrc: Oral  PainSc: 0-No pain   Pain Goal:                   [name]

## 2022-06-13 NOTE — Discharge Summary (Signed)
Postpartum Discharge Summary  Patient Name: Lori Rice DOB: 1985/04/09 MRN: [id number]  Date of admission: 06/11/2022 Delivery date:06/11/2022  Delivering provider: [name]  Date of discharge: 06/13/2022  Admitting diagnosis: Pregnancy [Z34.90] VBAC (vaginal birth after Cesarean) [O34.219] Intrauterine pregnancy: [redacted]w[redacted]d     Secondary diagnosis:  Principal Problem:   Pregnancy Active Problems:   VBAC (vaginal birth after Cesarean)  Additional problems: None    Discharge diagnosis: Term Pregnancy Delivered                                              Post partum procedures: None Augmentation: AROM Complications: None  Hospital course: Onset of Labor With Vaginal Delivery      37 y.o. yo [id number] at [redacted]w[redacted]d was admitted in Latent Labor on 06/11/2022. Labor course was complicated by none  Membrane Rupture Time/Date: 6:16 PM ,06/11/2022   Delivery Method:VBAC, Spontaneous  Episiotomy: None  Lacerations:  2nd degree;Periurethral  Patient had a postpartum course complicated by none.  She is ambulating, tolerating a regular diet, passing flatus, and urinating well. Patient is discharged home in stable condition on 06/13/22.  Newborn Data: Birth date:06/11/2022  Birth time:10:31 PM  Gender:Female  Living status:Living  Apgars:9 ,9  Weight:3620 g   Magnesium Sulfate received: No BMZ received: No Rhophylac:N/A MMR:N/A T-DaP:Given prenatally Flu: N/A Transfusion:No  Physical exam  Vitals:   06/12/22 0850 06/12/22 1355 06/13/22 0024 06/13/22 0527  BP: 119/70 117/75 123/76 116/65  Pulse: 64 74 66 62  Resp: Temp: 97.6 F (36.4 C) 98.1 F (36.7 C)  97.9 F (36.6 C)  TempSrc: Oral Oral Oral Oral  SpO2: 98% 97% 98% 97%  Weight:      Height:       General: alert, cooperative, and no distress Lochia: appropriate Uterine Fundus: firm Incision: N/A DVT Evaluation: No evidence of DVT seen on physical exam. Labs: Lab Results  Component Value Date   WBC  14.1 (H) 06/12/2022   HGB 10.6 (L) 06/12/2022   HCT 30.1 (L) 06/12/2022   MCV 86.5 06/12/2022   PLT 147 (L) 06/12/2022      Latest Ref Rng & Units 02/11/2022    7:16 PM  CMP  Glucose 70 - 99 mg/dL 78   BUN 6 - 20 mg/dL 8   Creatinine 0.34 - 7.42 mg/dL 5.95   Sodium 638 - 756 mmol/L 138   Potassium 3.5 - 5.1 mmol/L 4.2   Chloride 98 - 111 mmol/L 108   CO2 22 - 32 mmol/L 23   Calcium 8.9 - 10.3 mg/dL 8.8   Total Protein 6.5 - 8.1 g/dL 6.2   Total Bilirubin 0.3 - 1.2 mg/dL 0.6   Alkaline Phos 38 - 126 U/L 52   AST 15 - 41 U/L 19   ALT 0 - 44 U/L 13    Edinburgh Score:    06/13/2022   12:24 AM  Edinburgh Postnatal Depression Scale Screening Tool  I have been able to laugh and see the funny side of things. 0  I have looked forward with enjoyment to things. 0  I have blamed myself unnecessarily when things went wrong. 0  I have been anxious or worried for no good reason. 0  I have felt scared or panicky for no good reason. 0  Things have been getting on top of me. 0  I have been so unhappy that I have had difficulty sleeping. 0  I have felt sad or miserable. 0  I have been so unhappy that I have been crying. 0  The thought of harming myself has occurred to me. 0  Edinburgh Postnatal Depression Scale Total 0      After visit meds:  Allergies as of 06/13/2022   No Known Allergies      Medication List     TAKE these medications    escitalopram 10 MG tablet Commonly known as: LEXAPRO TAKE ONE TABLET BY MOUTH ONE TIME DAILY   multivitamin-prenatal 27-0.8 MG Tabs tablet Take 1 tablet by mouth daily at 12 noon.         Discharge home in stable condition Infant Feeding: Bottle and Breast Infant Disposition:home with mother Discharge instruction: per After Visit Summary and Postpartum booklet. Activity: Advance as tolerated. Pelvic rest for 6 weeks.  Diet: routine diet Anticipated Birth Control: Unsure Postpartum Appointment:6 weeks Additional Postpartum F/U:   None Future Appointments: Future Appointments  Date Time Provider Department Center  09/30/2022 10:20 AM Shade Flood, MD [facility]   Follow up Visit:      06/13/2022 [name], MD

## 2022-06-15 ENCOUNTER — Telehealth: Payer: Self-pay

## 2022-06-15 NOTE — Transitions of Care (Post Inpatient/ED Visit) (Signed)
   06/15/2022  Name: Kollette Rolf MRN: [id number] DOB: February 09, 1986  Today's TOC FU Call Status: Today's TOC FU Call Status:: Unsuccessul Call (1st Attempt) Unsuccessful Call (1st Attempt) Date: 06/15/22  Attempted to reach the patient regarding the most recent Inpatient/ED visit.  Follow Up Plan: Additional outreach attempts will be made to reach the patient to complete the Transitions of Care (Post Inpatient/ED visit) call.     Antionette Fairy, RN,BSN,CCM [facility] Care Management Care Management Community Coordinator Direct Phone: [phone] Toll Free: [phone] Fax: [phone]

## 2022-06-16 ENCOUNTER — Telehealth: Payer: Self-pay

## 2022-06-16 DIAGNOSIS — R011 Cardiac murmur, unspecified: Secondary | ICD-10-CM | POA: Diagnosis not present

## 2022-06-16 DIAGNOSIS — Q210 Ventricular septal defect: Secondary | ICD-10-CM | POA: Diagnosis not present

## 2022-06-16 NOTE — Transitions of Care (Post Inpatient/ED Visit) (Signed)
   06/16/2022  Name: Royelle Hinchman MRN: [id number] DOB: [birth year]  Today's TOC FU Call Status: Today's TOC FU Call Status:: Successful TOC FU Call Competed TOC FU Call Complete Date: 06/16/22  Transition Care Management Follow-up Telephone Call Date of Discharge: 06/13/22 Discharge Facility: [facility]) Type of Discharge: Inpatient Admission Primary Inpatient Discharge Diagnosis:: "VBAC" How have you been since you were released from the hospital?: Better (Spoke briefly with pt as she was at pediatrician office with other child. She states her her and the baby are doing good and no issues or concerns voiced at this time.) Any questions or concerns?: No  Items Reviewed: Did you receive and understand the discharge instructions provided?: Yes Medications obtained and verified?: No (pt at MD office) Any new allergies since your discharge?: No Dietary orders reviewed?: NA Do you have support at home?: Yes People in Home: spouse Name of Support/Comfort Primary Source: [facility] and Equipment/Supplies: Were Home Health Services Ordered?: NA Any new equipment or medical supplies ordered?: NA  Functional Questionnaire: Do you need assistance with bathing/showering or dressing?: No Do you need assistance with meal preparation?: No Do you need assistance with eating?: No Do you have difficulty maintaining continence: No Do you need assistance with getting out of bed/getting out of a chair/moving?: No Do you have difficulty managing or taking your medications?: No  Follow up appointments reviewed: PCP Follow-up appointment confirmed?: NA Specialist Hospital Follow-up appointment confirmed?: No Reason Specialist Follow-Up Not Confirmed: Patient has Specialist Provider Number and will Call for Appointment Do you need transportation to your follow-up appointment?: No Do you understand care options if your condition(s) worsen?: Yes-patient verbalized understanding   TOC  Interventions Today    Flowsheet Row Most Recent Value  TOC Interventions   TOC Interventions Discussed/Reviewed TOC Interventions Discussed         Antionette Fairy, RN,BSN,CCM [facility] Care Management Care Management Community Coordinator Direct Phone: [phone] Toll Free: [phone] Fax: [phone]

## 2022-06-22 ENCOUNTER — Inpatient Hospital Stay (HOSPITAL_COMMUNITY): Payer: BC Managed Care – PPO

## 2022-06-22 ENCOUNTER — Telehealth (HOSPITAL_COMMUNITY): Payer: Self-pay | Admitting: *Deleted

## 2022-06-22 ENCOUNTER — Inpatient Hospital Stay (HOSPITAL_COMMUNITY)
Admission: RE | Admit: 2022-06-22 | Payer: BC Managed Care – PPO | Source: Home / Self Care | Admitting: Obstetrics and Gynecology

## 2022-06-22 DIAGNOSIS — R21 Rash and other nonspecific skin eruption: Secondary | ICD-10-CM | POA: Diagnosis not present

## 2022-06-22 NOTE — Telephone Encounter (Signed)
Attempted hospital discharge follow-up call. Left message for patient to return RN call with any questions or concerns. Deforest Hoyles, RN, 06/22/22, [phone]

## 2022-06-23 ENCOUNTER — Ambulatory Visit: Payer: BC Managed Care – PPO | Admitting: Family Medicine

## 2022-06-23 DIAGNOSIS — Z6791 Unspecified blood type, Rh negative: Secondary | ICD-10-CM | POA: Insufficient documentation

## 2022-06-23 DIAGNOSIS — F418 Other specified anxiety disorders: Secondary | ICD-10-CM | POA: Insufficient documentation

## 2022-06-23 DIAGNOSIS — Q270 Congenital absence and hypoplasia of umbilical artery: Secondary | ICD-10-CM | POA: Insufficient documentation

## 2022-06-23 DIAGNOSIS — O468X9 Other antepartum hemorrhage, unspecified trimester: Secondary | ICD-10-CM | POA: Insufficient documentation

## 2022-06-23 DIAGNOSIS — G43909 Migraine, unspecified, not intractable, without status migrainosus: Secondary | ICD-10-CM | POA: Insufficient documentation

## 2022-06-23 DIAGNOSIS — Z8669 Personal history of other diseases of the nervous system and sense organs: Secondary | ICD-10-CM | POA: Insufficient documentation

## 2022-06-23 DIAGNOSIS — G43009 Migraine without aura, not intractable, without status migrainosus: Secondary | ICD-10-CM | POA: Diagnosis not present

## 2022-06-23 DIAGNOSIS — Z98891 History of uterine scar from previous surgery: Secondary | ICD-10-CM | POA: Insufficient documentation

## 2022-06-23 DIAGNOSIS — Z87898 Personal history of other specified conditions: Secondary | ICD-10-CM | POA: Insufficient documentation

## 2022-06-25 DIAGNOSIS — G43719 Chronic migraine without aura, intractable, without status migrainosus: Secondary | ICD-10-CM | POA: Diagnosis not present

## 2022-06-25 DIAGNOSIS — M542 Cervicalgia: Secondary | ICD-10-CM | POA: Diagnosis not present

## 2022-07-22 DIAGNOSIS — Z1389 Encounter for screening for other disorder: Secondary | ICD-10-CM | POA: Diagnosis not present

## 2022-08-01 ENCOUNTER — Other Ambulatory Visit: Payer: Self-pay | Admitting: Family Medicine

## 2022-08-01 DIAGNOSIS — F418 Other specified anxiety disorders: Secondary | ICD-10-CM

## 2022-08-25 DIAGNOSIS — F902 Attention-deficit hyperactivity disorder, combined type: Secondary | ICD-10-CM | POA: Diagnosis not present

## 2022-08-25 DIAGNOSIS — Z79899 Other long term (current) drug therapy: Secondary | ICD-10-CM | POA: Diagnosis not present

## 2022-08-26 ENCOUNTER — Other Ambulatory Visit: Payer: Self-pay | Admitting: Family Medicine

## 2022-08-26 DIAGNOSIS — F418 Other specified anxiety disorders: Secondary | ICD-10-CM

## 2022-08-27 DIAGNOSIS — F902 Attention-deficit hyperactivity disorder, combined type: Secondary | ICD-10-CM | POA: Diagnosis not present

## 2022-09-15 DIAGNOSIS — R011 Cardiac murmur, unspecified: Secondary | ICD-10-CM | POA: Diagnosis not present

## 2022-09-30 ENCOUNTER — Ambulatory Visit (INDEPENDENT_AMBULATORY_CARE_PROVIDER_SITE_OTHER): Payer: BC Managed Care – PPO | Admitting: Family Medicine

## 2022-09-30 ENCOUNTER — Encounter: Payer: Self-pay | Admitting: Family Medicine

## 2022-09-30 VITALS — BP 122/70 | HR 70 | Temp 97.8°F | Ht 66.0 in | Wt 169.4 lb

## 2022-09-30 DIAGNOSIS — F418 Other specified anxiety disorders: Secondary | ICD-10-CM

## 2022-09-30 DIAGNOSIS — O34219 Maternal care for unspecified type scar from previous cesarean delivery: Secondary | ICD-10-CM

## 2022-09-30 DIAGNOSIS — Z0000 Encounter for general adult medical examination without abnormal findings: Secondary | ICD-10-CM

## 2022-09-30 DIAGNOSIS — Z131 Encounter for screening for diabetes mellitus: Secondary | ICD-10-CM | POA: Diagnosis not present

## 2022-09-30 DIAGNOSIS — Z13220 Encounter for screening for lipoid disorders: Secondary | ICD-10-CM | POA: Diagnosis not present

## 2022-09-30 LAB — COMPREHENSIVE METABOLIC PANEL
ALT: 21 U/L (ref 0–35)
AST: 21 U/L (ref 0–37)
Albumin: 4.7 g/dL (ref 3.5–5.2)
Alkaline Phosphatase: 83 U/L (ref 39–117)
BUN: 15 mg/dL (ref 6–23)
CO2: 23 mEq/L (ref 19–32)
Calcium: 9.8 mg/dL (ref 8.4–10.5)
Chloride: 109 mEq/L (ref 96–112)
Creatinine, Ser: 1 mg/dL (ref 0.40–1.20)
GFR: 73.45 mL/min (ref >60.00)
Glucose, Bld: 75 mg/dL (ref 70–99)
Potassium: 4.2 mEq/L (ref 3.5–5.1)
Sodium: 140 mEq/L (ref 135–145)
Total Bilirubin: 0.6 mg/dL (ref 0.2–1.2)
Total Protein: 7.8 g/dL (ref 6.0–8.3)

## 2022-09-30 LAB — CBC WITH DIFFERENTIAL/PLATELET
Basophils Absolute: 0 10*3/uL (ref 0.0–0.1)
Basophils Relative: 0.5 % (ref 0.0–3.0)
Eosinophils Absolute: 0.1 10*3/uL (ref 0.0–0.7)
Eosinophils Relative: 2.1 % (ref 0.0–5.0)
HCT: 42.8 % (ref 36.0–46.0)
Hemoglobin: 14.2 g/dL (ref 12.0–15.0)
Lymphocytes Relative: 50.1 % — ABNORMAL HIGH (ref 12.0–46.0)
Lymphs Abs: 2.3 10*3/uL (ref 0.7–4.0)
MCHC: 33.1 g/dL (ref 30.0–36.0)
MCV: 86.9 fl (ref 78.0–100.0)
Monocytes Absolute: 0.4 10*3/uL (ref 0.1–1.0)
Monocytes Relative: 7.9 % (ref 3.0–12.0)
Neutro Abs: 1.8 10*3/uL (ref 1.4–7.7)
Neutrophils Relative %: 39.4 % — ABNORMAL LOW (ref 43.0–77.0)
Platelets: 197 10*3/uL (ref 150.0–400.0)
RBC: 4.92 Mil/uL (ref 3.87–5.11)
RDW: 13.3 % (ref 11.5–15.5)
WBC: 4.6 10*3/uL (ref 4.0–10.5)

## 2022-09-30 LAB — LIPID PANEL
Cholesterol: 208 mg/dL — ABNORMAL HIGH (ref 0–200)
HDL: 55.8 mg/dL (ref >39.00)
LDL Cholesterol: 140 mg/dL — ABNORMAL HIGH (ref 0–99)
NonHDL: 152.1
Total CHOL/HDL Ratio: 4
Triglycerides: 59 mg/dL (ref 0.0–149.0)
VLDL: 11.8 mg/dL (ref 0.0–40.0)

## 2022-09-30 LAB — HEMOGLOBIN A1C: Hgb A1c MFr Bld: 5.1 % (ref 4.6–6.5)

## 2022-09-30 MED ORDER — ESCITALOPRAM OXALATE 5 MG PO TABS
5.0000 mg | ORAL_TABLET | Freq: Every day | ORAL | 1 refills | Status: DC
Start: 2022-09-30 — End: 2023-01-05

## 2022-09-30 NOTE — Progress Notes (Unsigned)
Subjective:  Patient ID: Lori Rice, female    DOB: Feb 19, 1986  Age: 37 y.o. MRN: [id number]  CC:  Chief Complaint  Patient presents with   Annual Exam    HPI Lori Rice presents for Annual Exam  PCP[facility]  Dr. Langston Masker.  Vaginal birth after cesarean section on April 11.  Baby boy, Lori Rice, doing well. Daughter Lori Rice is almost 3. Staring preschool this fall  [place] attention specialist for history of ADD. Just started back - weaned off breastfeeding after 3 months. Saw them 1 month ago. Back on Adderall 15mg  every day.  Headache specialist, Dr. Neale Burly, migraine headaches - HA's returned, on topamax and elatriptan.    Anxiety treated with Lexapro 10mg . Working ok. Some increased anxiety recently postpartum. Similar timing of symptoms after her dtr. Does not feel depression - just heightened anxiety. Would like to try 15mg .      09/30/2022   10:35 AM 03/28/2021   10:15 AM 09/25/2020    9:21 AM 06/15/2019   11:20 AM  GAD 7 : Generalized Anxiety Score  Nervous, Anxious, on Edge 1 1 1 1   Control/stop worrying 0 1 1 1   Worry too much - different things 1 1 1 1   Trouble relaxing 1 1 1 1   Restless 0 1 0 0  Easily annoyed or irritable 0 1 0 1  Afraid - awful might happen 0 0 0 1  Total GAD 7 Score 3 6 4 6        09/30/2022   10:35 AM 09/25/2021    2:24 PM 07/29/2021   12:57 PM 03/28/2021   10:17 AM 09/25/2020    9:18 AM  Depression screen PHQ 2/9  Decreased Interest 0 0 0 0 0  Down, Depressed, Hopeless 0 0 0 0 0  PHQ - 2 Score 0 0 0 0 0  Altered sleeping 0 1 0 0 0  Tired, decreased energy 1 0 0 0 0  Change in appetite 1 0 0 0 0  Feeling bad or failure about yourself  0 0 0 0 0  Trouble concentrating 0 0 0 0 0  Moving slowly or fidgety/restless 0 0 0 0 0  Suicidal thoughts 0 0 0 0 0  PHQ-9 Score 2 1 0 0 0  Difficult doing work/chores   Not difficult at all      Health Maintenance  Topic Date Due   Hepatitis C Screening  Never done   COVID-19  Vaccine (1 - 2023-24 season) Never done   PAP SMEAR-Modifier  12/05/2021   INFLUENZA VACCINE  10/01/2022   DTaP/Tdap/Td (4 - Td or Tdap) 03/20/2032   HIV Screening  Completed   HPV VACCINES  Aged Out  Pap testing with GYN - last one in 2023.    Immunization History  Administered Date(s) Administered   Influenza Inj Mdck Quad Pf 12/25/2021   Influenza,inj,Quad PF,6+ Mos 11/08/2015, 03/28/2021   Influenza-Unspecified 12/15/2018   Tdap 11/08/2015, 09/19/2019, 03/20/2022  Flu vaccine in fall.  Has received covid vaccine, booster recommended.   No results found. Mild prescription glasses if needed. 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 No recent exam.   Dental: Every 6 months  Alcohol: rare. Avoiding with migraines.   Tobacco: None  Exercise: walking more, and weights at gym - over per week.    History Patient Active Problem List   Diagnosis Date Noted   History of bradycardia 06/23/2022   History of cesarean section 06/23/2022  History of migraine 06/23/2022   Migraine 06/23/2022   Mixed anxiety and depressive disorder 06/23/2022   RhD negative 06/23/2022   Single umbilical artery 06/23/2022   Subchorionic hematoma 06/23/2022   VBAC (vaginal birth after Cesarean) 06/12/2022   Pregnancy 06/11/2022   Fall 05/22/2022   At high risk for breast cancer 05/01/2021   Encounter for induction of labor 12/15/2019   Incomplete miscarriage 02/26/2019   Vaginal bleeding affecting early pregnancy 02/26/2019   Encounter for procreative genetic counseling 11/08/2018   Situational anxiety 11/08/2015   Palpitations 06/19/2013   Dyspnea 06/19/2013   Retrocecal appendicitis 07/21/2012   Past Medical History:  Diagnosis Date   Anxiety    Bradycardia    Depression    Migraines    Mononucleosis    Palpitations    Tachycardia    Past Surgical History:  Procedure Laterality Date   CESAREAN SECTION N/A 12/16/2019   Procedure: CESAREAN SECTION;  Surgeon: Candice Camp, MD;   Location: [facility] LD ORS;  Service: Obstetrics;  Laterality: N/A;   LAPAROSCOPIC APPENDECTOMY N/A 07/21/2012   Procedure: APPENDECTOMY LAPAROSCOPIC;  Surgeon: Currie Paris, MD;  Location: [facility] ORS;  Service: General;  Laterality: N/A;   TONSILLECTOMY     TONSILLECTOMY     No Known Allergies Prior to Admission medications   Medication Sig Start Date End Date Taking? Authorizing Provider  butalbital-acetaminophen-caffeine (FIORICET) 50-325-40 MG tablet Take 1 tablet by mouth every 4 (four) hours as needed.   Yes [provider]  cyclobenzaprine (FLEXERIL) 10 MG tablet Take 10 mg by mouth 2 (two) times daily as needed. 05/26/22  Yes [provider]  cyclobenzaprine (FLEXERIL) 10 MG tablet 1 TAB TWICE A DAY AS NEEDED LIMIT 1-2 TREATMENTS PER WEEK   Yes [provider]  diphenoxylate-atropine (LOMOTIL) 2.5-0.025 MG tablet    Yes [provider]  eletriptan (RELPAX) 40 MG tablet Take by mouth.   Yes [provider]  escitalopram (LEXAPRO) 10 MG tablet TAKE 1 TABLET BY MOUTH EVERY DAY 08/26/22  Yes Shade Flood, MD  Prenatal Vit-Fe Fumarate-FA (MULTIVITAMIN-PRENATAL) 27-0.8 MG TABS tablet Take 1 tablet by mouth daily at 12 noon.   Yes [provider]  topiramate (TOPAMAX) 50 MG tablet Take 3 tablets by mouth daily.   Yes [provider]   Social History   Socioeconomic History   Marital status: Married    Spouse name: Not on file   Number of children: 0   Years of education: Not on file   Highest education level: Not on file  Occupational History   Occupation: Singer/songwriter    Employer: [employer]  Tobacco Use   Smoking status: Never   Smokeless tobacco: Never  Substance and Sexual Activity   Alcohol use: Not Currently   Drug use: No   Sexual activity: Not Currently    Partners: Male    Birth control/protection: None    Comment: last IC in past week per patient  Other Topics Concern   Not on file  Social  History Narrative   Not on file   Social Determinants of Health   Financial Resource Strain: Not on file  Food Insecurity: No Food Insecurity (06/11/2022)   Hunger Vital Sign    Worried About Running Out of Food in the Last Year: Never true    Ran Out of Food in the Last Year: Never true  Transportation Needs: No Transportation Needs (06/11/2022)   PRAPARE - Administrator, Civil Service (Medical):  No    Lack of Transportation (Non-Medical): No  Physical Activity: Not on file  Stress: Not on file  Social Connections: Unknown (06/22/2022)   Received from [facility]   Social Network    Social Network: Not on file  Intimate Partner Violence: Unknown (06/22/2022)   Received from [facility], [facility]   HITS    Physically Hurt: Not on file    Insult or Talk Down To: Not on file    Threaten Physical Harm: Not on file    Scream or Curse: Not on file    Review of Systems 13 point review of systems per patient health survey noted.  Negative other than as indicated above or in HPI.    Objective:   Vitals:   09/30/22 1038  BP: 122/70  Pulse: 70  Temp: 97.8 F (36.6 C)  TempSrc: Temporal  SpO2: 99%  Weight: 169 lb 6.4 oz (76.8 kg)  Height: 5\' 6"  (1.676 m)   {Vitals History (Optional):23777}  Physical Exam Constitutional:      Appearance: She is well-developed.  HENT:     Head: Normocephalic and atraumatic.     Right Ear: External ear normal.     Left Ear: External ear normal.  Eyes:     Conjunctiva/sclera: Conjunctivae normal.     Pupils: Pupils are equal, round, and reactive to light.  Neck:     Thyroid: No thyromegaly.  Cardiovascular:     Rate and Rhythm: Normal rate and regular rhythm.     Heart sounds: Normal heart sounds. No murmur heard. Pulmonary:     Effort: Pulmonary effort is normal. No respiratory distress.     Breath sounds: Normal breath sounds. No wheezing.  Abdominal:     General: Bowel sounds are normal.      Palpations: Abdomen is soft.     Tenderness: There is no abdominal tenderness.  Musculoskeletal:        General: No tenderness. Normal range of motion.     Cervical back: Normal range of motion and neck supple.  Lymphadenopathy:     Cervical: No cervical adenopathy.  Skin:    General: Skin is warm and dry.     Findings: No rash.  Neurological:     Mental Status: She is alert and oriented to person, place, and time.  Psychiatric:        Behavior: Behavior normal.        Thought Content: Thought content normal.        Assessment & Plan:  Lori Rice is a 37 y.o. female . Annual physical exam - Plan: CBC with Differential/Platelet  Screening for hyperlipidemia - Plan: Lipid panel  Screening for diabetes mellitus - Plan: Comprehensive metabolic panel, Hemoglobin A1c  Depression with anxiety - Plan: escitalopram (LEXAPRO) 5 MG tablet  VBAC (vaginal birth after Cesarean) - Plan: CBC with Differential/Platelet   Meds ordered this encounter  Medications   escitalopram (LEXAPRO) 5 MG tablet    Sig: Take 1 tablet (5 mg total) by mouth daily. Combine with 10mg  for total dose 15mg  every day.    Dispense:  9 tablet    Refill:  1   Patient Instructions  Glad you are doing well.  I think the additional 5 mg of Lexapro can be helpful, can recheck in 3 months and see how that dose is doing.  Happy to see you sooner if needed.  I will check some screening labs and let you know if there are any  concerns.  Take care!  Preventive Care 12-28 Years Old, Female Preventive care refers to lifestyle choices and visits with your health care provider that can promote health and wellness. Preventive care visits are also called wellness exams. What can I expect for my preventive care visit? Counseling During your preventive care visit, your health care provider may ask about your: Medical history, including: Past medical problems. Family medical history. Pregnancy history. Current health,  including: Menstrual cycle. Method of birth control. Emotional well-being. Home life and relationship well-being. Sexual activity and sexual health. Lifestyle, including: Alcohol, nicotine or tobacco, and drug use. Access to firearms. Diet, exercise, and sleep habits. Work and work Astronomer. Sunscreen use. Safety issues such as seatbelt and bike helmet use. Physical exam Your health care provider may check your: Height and weight. These may be used to calculate your BMI (body mass index). BMI is a measurement that tells if you are at a healthy weight. Waist circumference. This measures the distance around your waistline. This measurement also tells if you are at a healthy weight and may help predict your risk of certain diseases, such as type 2 diabetes and high blood pressure. Heart rate and blood pressure. Body temperature. Skin for abnormal spots. What immunizations do I need?  Vaccines are usually given at various ages, according to a schedule. Your health care provider will recommend vaccines for you based on your age, medical history, and lifestyle or other factors, such as travel or where you work. What tests do I need? Screening Your health care provider may recommend screening tests for certain conditions. This may include: Pelvic exam and Pap test. Lipid and cholesterol levels. Diabetes screening. This is done by checking your blood sugar (glucose) after you have not eaten for a while (fasting). Hepatitis B test. Hepatitis C test. HIV (human immunodeficiency virus) test. STI (sexually transmitted infection) testing, if you are at risk. BRCA-related cancer screening. This may be done if you have a family history of breast, ovarian, tubal, or peritoneal cancers. Talk with your health care provider about your test results, treatment options, and if necessary, the need for more tests. Follow these instructions at home: Eating and drinking  Eat a healthy diet that  includes fresh fruits and vegetables, whole grains, lean protein, and low-fat dairy products. Take vitamin and mineral supplements as recommended by your health care provider. Do not drink alcohol if: Your health care provider tells you not to drink. You are pregnant, may be pregnant, or are planning to become pregnant. If you drink alcohol: Limit how much you have to 0-1 drink a day. Know how much alcohol is in your drink. In the [place] one drink equals one 12 oz bottle of beer (355 mL), one 5 oz glass of wine (148 mL), or one 1 oz glass of hard liquor (44 mL). Lifestyle Brush your teeth every morning and night with fluoride toothpaste. Floss one time each day. Exercise for at least 30 minutes 5 or more days each week. Do not use any products that contain nicotine or tobacco. These products include cigarettes, chewing tobacco, and vaping devices, such as e-cigarettes. If you need help quitting, ask your health care provider. Do not use drugs. If you are sexually active, practice safe sex. Use a condom or other form of protection to prevent STIs. If you do not wish to become pregnant, use a form of birth control. If you plan to become pregnant, see your health care provider for a prepregnancy visit. Find healthy ways  to manage stress, such as: Meditation, yoga, or listening to music. Journaling. Talking to a trusted person. Spending time with friends and family. Minimize exposure to UV radiation to reduce your risk of skin cancer. Safety Always wear your seat belt while driving or riding in a vehicle. Do not drive: If you have been drinking alcohol. Do not ride with someone who has been drinking. If you have been using any mind-altering substances or drugs. While texting. When you are tired or distracted. Wear a helmet and other protective equipment during sports activities. If you have firearms in your house, make sure you follow all gun safety procedures. Seek help if you have been  physically or sexually abused. What's next? Go to your health care provider once a year for an annual wellness visit. Ask your health care provider how often you should have your eyes and teeth checked. Stay up to date on all vaccines. This information is not intended to replace advice given to you by your health care provider. Make sure you discuss any questions you have with your health care provider. Document Revised: 08/14/2020 Document Reviewed: 08/14/2020 Elsevier Patient Education  2024 [employer].     Signed,   Meredith Staggers, MD [facility], [facility] 09/30/22 11:08 AM

## 2022-09-30 NOTE — Patient Instructions (Addendum)
Glad you are doing well.  I think the additional 5 mg of Lexapro can be helpful, can recheck in 3 months and see how that dose is doing.  Happy to see you sooner if needed.  I will check some screening labs and let you know if there are any concerns.  Take care!  Preventive Care 35-35 Years Old, Female Preventive care refers to lifestyle choices and visits with your health care provider that can promote health and wellness. Preventive care visits are also called wellness exams. What can I expect for my preventive care visit? Counseling During your preventive care visit, your health care provider may ask about your: Medical history, including: Past medical problems. Family medical history. Pregnancy history. Current health, including: Menstrual cycle. Method of birth control. Emotional well-being. Home life and relationship well-being. Sexual activity and sexual health. Lifestyle, including: Alcohol, nicotine or tobacco, and drug use. Access to firearms. Diet, exercise, and sleep habits. Work and work Astronomer. Sunscreen use. Safety issues such as seatbelt and bike helmet use. Physical exam Your health care provider may check your: Height and weight. These may be used to calculate your BMI (body mass index). BMI is a measurement that tells if you are at a healthy weight. Waist circumference. This measures the distance around your waistline. This measurement also tells if you are at a healthy weight and may help predict your risk of certain diseases, such as type 2 diabetes and high blood pressure. Heart rate and blood pressure. Body temperature. Skin for abnormal spots. What immunizations do I need?  Vaccines are usually given at various ages, according to a schedule. Your health care provider will recommend vaccines for you based on your age, medical history, and lifestyle or other factors, such as travel or where you work. What tests do I need? Screening Your health care  provider may recommend screening tests for certain conditions. This may include: Pelvic exam and Pap test. Lipid and cholesterol levels. Diabetes screening. This is done by checking your blood sugar (glucose) after you have not eaten for a while (fasting). Hepatitis B test. Hepatitis C test. HIV (human immunodeficiency virus) test. STI (sexually transmitted infection) testing, if you are at risk. BRCA-related cancer screening. This may be done if you have a family history of breast, ovarian, tubal, or peritoneal cancers. Talk with your health care provider about your test results, treatment options, and if necessary, the need for more tests. Follow these instructions at home: Eating and drinking  Eat a healthy diet that includes fresh fruits and vegetables, whole grains, lean protein, and low-fat dairy products. Take vitamin and mineral supplements as recommended by your health care provider. Do not drink alcohol if: Your health care provider tells you not to drink. You are pregnant, may be pregnant, or are planning to become pregnant. If you drink alcohol: Limit how much you have to 0-1 drink a day. Know how much alcohol is in your drink. In the [place] one drink equals one 12 oz bottle of beer (355 mL), one 5 oz glass of wine (148 mL), or one 1 oz glass of hard liquor (44 mL). Lifestyle Brush your teeth every morning and night with fluoride toothpaste. Floss one time each day. Exercise for at least 30 minutes 5 or more days each week. Do not use any products that contain nicotine or tobacco. These products include cigarettes, chewing tobacco, and vaping devices, such as e-cigarettes. If you need help quitting, ask your health care provider. Do not use drugs.  If you are sexually active, practice safe sex. Use a condom or other form of protection to prevent STIs. If you do not wish to become pregnant, use a form of birth control. If you plan to become pregnant, see your health care provider  for a prepregnancy visit. Find healthy ways to manage stress, such as: Meditation, yoga, or listening to music. Journaling. Talking to a trusted person. Spending time with friends and family. Minimize exposure to UV radiation to reduce your risk of skin cancer. Safety Always wear your seat belt while driving or riding in a vehicle. Do not drive: If you have been drinking alcohol. Do not ride with someone who has been drinking. If you have been using any mind-altering substances or drugs. While texting. When you are tired or distracted. Wear a helmet and other protective equipment during sports activities. If you have firearms in your house, make sure you follow all gun safety procedures. Seek help if you have been physically or sexually abused. What's next? Go to your health care provider once a year for an annual wellness visit. Ask your health care provider how often you should have your eyes and teeth checked. Stay up to date on all vaccines. This information is not intended to replace advice given to you by your health care provider. Make sure you discuss any questions you have with your health care provider. Document Revised: 08/14/2020 Document Reviewed: 08/14/2020 Elsevier Patient Education  2024 [employer].

## 2022-10-01 ENCOUNTER — Encounter: Payer: Self-pay | Admitting: Family Medicine

## 2022-10-03 ENCOUNTER — Other Ambulatory Visit: Payer: Self-pay | Admitting: Family Medicine

## 2022-10-03 DIAGNOSIS — D72820 Lymphocytosis (symptomatic): Secondary | ICD-10-CM

## 2022-10-03 NOTE — Progress Notes (Signed)
See labs 

## 2022-10-05 ENCOUNTER — Encounter: Payer: Self-pay | Admitting: Family Medicine

## 2022-10-12 ENCOUNTER — Other Ambulatory Visit: Payer: Self-pay | Admitting: Family Medicine

## 2022-10-12 DIAGNOSIS — F418 Other specified anxiety disorders: Secondary | ICD-10-CM

## 2022-10-24 ENCOUNTER — Other Ambulatory Visit: Payer: Self-pay | Admitting: Family Medicine

## 2022-10-24 DIAGNOSIS — F418 Other specified anxiety disorders: Secondary | ICD-10-CM

## 2022-11-03 ENCOUNTER — Other Ambulatory Visit (INDEPENDENT_AMBULATORY_CARE_PROVIDER_SITE_OTHER): Payer: BC Managed Care – PPO

## 2022-11-03 ENCOUNTER — Other Ambulatory Visit: Payer: BC Managed Care – PPO

## 2022-11-03 DIAGNOSIS — D72820 Lymphocytosis (symptomatic): Secondary | ICD-10-CM

## 2022-11-03 LAB — CBC
HCT: 40.4 % (ref 36.0–46.0)
Hemoglobin: 13.4 g/dL (ref 12.0–15.0)
MCHC: 33.2 g/dL (ref 30.0–36.0)
MCV: 86.9 fl (ref 78.0–100.0)
Platelets: 190 10*3/uL (ref 150.0–400.0)
RBC: 4.64 Mil/uL (ref 3.87–5.11)
RDW: 13 % (ref 11.5–15.5)
WBC: 4.7 10*3/uL (ref 4.0–10.5)

## 2022-11-03 NOTE — Progress Notes (Signed)
Pt presented for CBC draw; drawn from Rt arm

## 2022-11-27 DIAGNOSIS — Z79899 Other long term (current) drug therapy: Secondary | ICD-10-CM | POA: Diagnosis not present

## 2022-11-27 DIAGNOSIS — F902 Attention-deficit hyperactivity disorder, combined type: Secondary | ICD-10-CM | POA: Diagnosis not present

## 2022-12-15 DIAGNOSIS — G43719 Chronic migraine without aura, intractable, without status migrainosus: Secondary | ICD-10-CM | POA: Diagnosis not present

## 2022-12-30 ENCOUNTER — Ambulatory Visit: Payer: BC Managed Care – PPO | Admitting: Family Medicine

## 2023-01-05 ENCOUNTER — Encounter (HOSPITAL_BASED_OUTPATIENT_CLINIC_OR_DEPARTMENT_OTHER): Payer: Self-pay

## 2023-01-05 ENCOUNTER — Other Ambulatory Visit: Payer: Self-pay

## 2023-01-05 ENCOUNTER — Other Ambulatory Visit (HOSPITAL_BASED_OUTPATIENT_CLINIC_OR_DEPARTMENT_OTHER): Payer: Self-pay

## 2023-01-05 ENCOUNTER — Emergency Department (HOSPITAL_BASED_OUTPATIENT_CLINIC_OR_DEPARTMENT_OTHER): Payer: BC Managed Care – PPO | Admitting: Radiology

## 2023-01-05 ENCOUNTER — Emergency Department (HOSPITAL_BASED_OUTPATIENT_CLINIC_OR_DEPARTMENT_OTHER)
Admission: EM | Admit: 2023-01-05 | Discharge: 2023-01-05 | Disposition: A | Discharge status: Home / Self Care | Payer: BC Managed Care – PPO | Attending: Emergency Medicine | Admitting: Emergency Medicine

## 2023-01-05 ENCOUNTER — Emergency Department (HOSPITAL_BASED_OUTPATIENT_CLINIC_OR_DEPARTMENT_OTHER): Payer: BC Managed Care – PPO

## 2023-01-05 DIAGNOSIS — M549 Dorsalgia, unspecified: Secondary | ICD-10-CM | POA: Insufficient documentation

## 2023-01-05 DIAGNOSIS — M545 Low back pain, unspecified: Secondary | ICD-10-CM | POA: Diagnosis not present

## 2023-01-05 DIAGNOSIS — S161XXA Strain of muscle, fascia and tendon at neck level, initial encounter: Secondary | ICD-10-CM | POA: Diagnosis not present

## 2023-01-05 DIAGNOSIS — M542 Cervicalgia: Secondary | ICD-10-CM | POA: Diagnosis not present

## 2023-01-05 DIAGNOSIS — S199XXA Unspecified injury of neck, initial encounter: Secondary | ICD-10-CM | POA: Diagnosis not present

## 2023-01-05 DIAGNOSIS — M25512 Pain in left shoulder: Secondary | ICD-10-CM | POA: Diagnosis not present

## 2023-01-05 DIAGNOSIS — M25511 Pain in right shoulder: Secondary | ICD-10-CM | POA: Diagnosis not present

## 2023-01-05 DIAGNOSIS — Y92410 Unspecified street and highway as the place of occurrence of the external cause: Secondary | ICD-10-CM | POA: Insufficient documentation

## 2023-01-05 DIAGNOSIS — M7918 Myalgia, other site: Secondary | ICD-10-CM

## 2023-01-05 MED ORDER — METHOCARBAMOL 500 MG PO TABS
500.0000 mg | ORAL_TABLET | Freq: Two times a day (BID) | ORAL | 0 refills | Status: DC
  Filled 2023-01-05: qty 20, 10d supply, fill #0
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
## 2023-01-13 ENCOUNTER — Ambulatory Visit: Payer: BC Managed Care – PPO | Admitting: Family Medicine

## 2023-01-19 ENCOUNTER — Telehealth: Payer: Self-pay | Admitting: *Deleted

## 2023-01-19 NOTE — Telephone Encounter (Signed)
Transition Care Management Unsuccessful Follow-up Telephone Call  Date of discharge and from where:  [facility]   01/05/2023 Attempts:  1st Attempt  Reason for unsuccessful TCM follow-up call:  No answer/busy

## 2023-01-21 ENCOUNTER — Telehealth: Payer: Self-pay | Admitting: *Deleted

## 2023-01-21 NOTE — Telephone Encounter (Signed)
Transition Care Management Unsuccessful Follow-up Telephone Call  Date of discharge and from where:  [facility]  01/05/2023  Attempts:  2nd Attempt  Reason for unsuccessful TCM follow-up call:  No answer/busy

## 2023-01-27 ENCOUNTER — Encounter: Payer: Self-pay | Admitting: Family

## 2023-01-27 ENCOUNTER — Ambulatory Visit: Payer: BC Managed Care – PPO | Admitting: Family

## 2023-01-27 VITALS — BP 116/81 | HR 75 | Temp 97.8°F | Ht 66.0 in | Wt 150.5 lb

## 2023-01-27 DIAGNOSIS — B349 Viral infection, unspecified: Secondary | ICD-10-CM | POA: Diagnosis not present

## 2023-01-27 NOTE — Progress Notes (Signed)
Patient ID: Lori Rice, female    DOB: [birth year], 37 y.o.   MRN: [id number]  Chief Complaint  Patient presents with   Sinus Problem    Pt c/o Nasal congestion, Cough, right eye redness and swollen lymph nodes in neck which has resolved, Present for 2 weeks. Sx start and resolve. Pt would like labs.        Discussed the use of AI scribe software for clinical note transcription with the patient, who gave verbal consent to proceed.  History of Present Illness   The patient, with a history of chronic migraines managed with Topamax, presents with a variety of symptoms that have been coming and going for a couple of weeks. The patient reports a cough, which was productive but has improved and is not causing significant disruption during the day. 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 The patient also reports having a bloodshot right eye for several days, sinus pressure, sores in the nose, and swollen lymph nodes. The lymph nodes have fluctuated in size, most recently swelling on the right side. The patient also reports a sore throat, which was most severe when the lymph nodes were swollen. The patient has not taken any over-the-counter medications for these symptoms.    Assessment & Plan:     Viral syndrome -  Persistent cough, laryngitis, sinus pressure, and lymphadenopathy for several weeks. Symptoms waxing and waning. No fever. Lungs clear on auscultation. -Recommend saline nasal spray for nasal symptoms, moisturizing & disinfecting. -Advise over-the-counter zinc, Vit. D and vitamin C to boost immune system. -Consider ibuprofen up to 600mg  tid or 1-2 generic Aleve bid for anti-inflammatory effects. -Advise adequate hydration, 2L qd. -Consider follow-up if symptoms persist or worsen next week.    Subjective:    Outpatient Medications Prior to Visit  Medication Sig Dispense Refill   amphetamine-dextroamphetamine (ADDERALL XR) 20  MG 24 hr capsule Take 20 mg by mouth daily.     amphetamine-dextroamphetamine (ADDERALL) 10 MG tablet Take 10 mg by mouth daily.     butalbital-acetaminophen-caffeine (FIORICET) 50-325-40 MG tablet Take 1 tablet by mouth every 4 (four) hours as needed.     eletriptan (RELPAX) 40 MG tablet Take by mouth.     escitalopram (LEXAPRO) 10 MG tablet TAKE ONE TABLET BY MOUTH ONCE A DAY 90 tablet 0   methocarbamol (ROBAXIN) 500 MG tablet Take 1 tablet (500 mg total) by mouth 2 (two) times daily. 20 tablet 0   topiramate (TOPAMAX) 50 MG tablet Take 3 tablets by mouth daily.     No facility-administered medications prior to visit.   Past Medical History:  Diagnosis Date   Anxiety    Bradycardia    Depression    Migraines    Mononucleosis    Palpitations    Tachycardia    Past Surgical History:  Procedure Laterality Date   CESAREAN SECTION N/A 12/16/2019   Procedure: CESAREAN SECTION;  Surgeon: Candice Camp, MD;  Location: [facility] ORS;  Service: Obstetrics;  Laterality: N/A;   LAPAROSCOPIC APPENDECTOMY N/A 07/21/2012   Procedure: APPENDECTOMY LAPAROSCOPIC;  Surgeon: Currie Paris, MD;  Location: [facility] ORS;  Service: General;  Laterality: N/A;   TONSILLECTOMY     TONSILLECTOMY     No Known Allergies    Objective:    Physical Exam Vitals and nursing note reviewed.  Constitutional:      Appearance: Normal appearance. She is not ill-appearing.     Interventions:  Face mask in place.  HENT:     Right Ear: Tympanic membrane and ear canal normal.     Left Ear: Tympanic membrane and ear canal normal.     Nose:     Right Sinus: No frontal sinus tenderness.     Left Sinus: No frontal sinus tenderness.     Mouth/Throat:     Mouth: Mucous membranes are moist.     Pharynx: No pharyngeal swelling, oropharyngeal exudate, posterior oropharyngeal erythema or uvula swelling.     Tonsils: No tonsillar exudate or tonsillar abscesses.  Cardiovascular:     Rate and Rhythm: Normal rate and regular rhythm.   Pulmonary:     Effort: Pulmonary effort is normal.     Breath sounds: Normal breath sounds.  Musculoskeletal:        General: Normal range of motion.  Lymphadenopathy:     Head:     Right side of head: No preauricular or posterior auricular adenopathy.     Left side of head: No preauricular or posterior auricular adenopathy.     Cervical: No cervical adenopathy.  Skin:    General: Skin is warm and dry.  Neurological:     Mental Status: She is alert.  Psychiatric:        Mood and Affect: Mood normal.        Behavior: Behavior normal.    BP 116/81 (BP Location: Left Arm, Patient Position: Sitting, Cuff Size: Large)   Pulse 75   Temp 97.8 F (36.6 C) (Temporal)   Ht 5\' 6"  (1.676 m)   Wt 150 lb 8 oz (68.3 kg)   LMP 12/29/2022 (Exact Date)   SpO2 100%   Breastfeeding No   BMI 24.29 kg/m  Wt Readings from Last 3 Encounters:  01/27/23 150 lb 8 oz (68.3 kg)  01/05/23 148 lb (67.1 kg)  09/30/22 169 lb 6.4 oz (76.8 kg)    Dulce Sellar, NP

## 2023-01-28 ENCOUNTER — Emergency Department (HOSPITAL_BASED_OUTPATIENT_CLINIC_OR_DEPARTMENT_OTHER)
Admission: EM | Admit: 2023-01-28 | Discharge: 2023-01-28 | Disposition: A | Discharge status: Home / Self Care | Payer: BC Managed Care – PPO | Attending: Emergency Medicine | Admitting: Emergency Medicine

## 2023-01-28 ENCOUNTER — Other Ambulatory Visit: Payer: Self-pay

## 2023-01-28 ENCOUNTER — Encounter (HOSPITAL_BASED_OUTPATIENT_CLINIC_OR_DEPARTMENT_OTHER): Payer: Self-pay

## 2023-01-28 DIAGNOSIS — G43809 Other migraine, not intractable, without status migrainosus: Secondary | ICD-10-CM | POA: Diagnosis not present

## 2023-01-28 DIAGNOSIS — R519 Headache, unspecified: Secondary | ICD-10-CM | POA: Diagnosis not present

## 2023-01-28 MED ORDER — DIPHENHYDRAMINE HCL 50 MG/ML IJ SOLN
12.5000 mg | Freq: Once | INTRAMUSCULAR | Status: AC
  Administered 2023-01-28: 12.5 mg via INTRAVENOUS
  Filled 2023-01-28: qty 1

## 2023-01-28 MED ORDER — PROCHLORPERAZINE EDISYLATE 10 MG/2ML IJ SOLN
10.0000 mg | Freq: Once | INTRAMUSCULAR | Status: AC
  Administered 2023-01-28: 10 mg via INTRAVENOUS
  Filled 2023-01-28: qty 2

## 2023-01-28 MED ORDER — SODIUM CHLORIDE 0.9 % IV BOLUS
1000.0000 mL | Freq: Once | INTRAVENOUS | Status: AC
  Administered 2023-01-28: 1000 mL via INTRAVENOUS

## 2023-01-28 NOTE — ED Provider Notes (Signed)
[place] EMERGENCY DEPARTMENT AT [facility] Provider Note   CSN: [id number] Arrival date & time: 01/28/23  2149     History Chief Complaint  Patient presents with   Migraine    Lori Rice is a 37 y.o. female. Patient with past history significant for migraine headaches, anxiety and depression presents to the ED with concerns of a migraine. States that current headache is similar to typical migraine for her with some photosensitivity, nausea, and significant head pain but no other focal symptoms. Denies vision changes, weakness, numbness, or slurred speech. Trying taking home migraine medications without significant improvement in symptoms. Requesting migraine cocktail. Denies any chance of pregnancy at this time.   Migraine Associated symptoms include headaches.       Home Medications Prior to Admission medications   Medication Sig Start Date End Date Taking? Authorizing Provider  amphetamine-dextroamphetamine (ADDERALL XR) 20 MG 24 hr capsule Take 20 mg by mouth daily. 10/01/22   [provider]  amphetamine-dextroamphetamine (ADDERALL) 10 MG tablet Take 10 mg by mouth daily. 12/20/22   [provider]  butalbital-acetaminophen-caffeine (FIORICET) 50-325-40 MG tablet Take 1 tablet by mouth every 4 (four) hours as needed.    [provider]  eletriptan (RELPAX) 40 MG tablet Take by mouth.    [provider]  escitalopram (LEXAPRO) 10 MG tablet TAKE ONE TABLET BY MOUTH ONCE A DAY 10/26/22   Shade Flood, MD  methocarbamol (ROBAXIN) 500 MG tablet Take 1 tablet (500 mg total) by mouth 2 (two) times daily. 01/05/23   Jeannie Fend, PA-C  topiramate (TOPAMAX) 50 MG tablet Take 3 tablets by mouth daily.    [provider]      Allergies    Patient has no known allergies.    Review of Systems   Review of Systems  Neurological:  Positive for headaches.  All other systems reviewed and are negative.   Physical  Exam Updated Vital Signs BP 106/72   Pulse 61   Temp 98.2 F (36.8 C) (Oral)   Resp 16   Ht 5\' 6"  (1.676 m)   Wt 68 kg   LMP 12/29/2022 (Exact Date)   SpO2 100%   BMI 24.20 kg/m  Physical Exam Vitals and nursing note reviewed.  Constitutional:      General: She is not in acute distress.    Appearance: She is well-developed.  HENT:     Head: Normocephalic and atraumatic.  Eyes:     Extraocular Movements: Extraocular movements intact.     Conjunctiva/sclera: Conjunctivae normal.     Pupils: Pupils are equal, round, and reactive to light.  Cardiovascular:     Rate and Rhythm: Normal rate and regular rhythm.     Heart sounds: No murmur heard. Pulmonary:     Effort: Pulmonary effort is normal. No respiratory distress.     Breath sounds: Normal breath sounds.  Abdominal:     Palpations: Abdomen is soft.     Tenderness: There is no abdominal tenderness.  Musculoskeletal:        General: No swelling.     Cervical back: Neck supple.  Skin:    General: Skin is warm and dry.     Capillary Refill: Capillary refill takes less than 2 seconds.  Neurological:     General: No focal deficit present.     Mental Status: She is alert and oriented to person, place, and time. Mental status is at baseline.     Cranial Nerves: No cranial nerve  deficit.     Motor: No weakness.  Psychiatric:        Mood and Affect: Mood normal.     ED Results / Procedures / Treatments   Labs (all labs ordered are listed, but only abnormal results are displayed) Labs Reviewed - No data to display  EKG None  Radiology No results found.  Procedures Procedures   Medications Ordered in ED Medications  sodium chloride 0.9 % bolus 1,000 mL (1,000 mLs Intravenous New Bag/Given 01/28/23 2249)  prochlorperazine (COMPAZINE) injection 10 mg (10 mg Intravenous Given 01/28/23 2244)  diphenhydrAMINE (BENADRYL) injection 12.5 mg (12.5 mg Intravenous Given 01/28/23 2245)    ED Course/ Medical Decision  Making/ A&P                               Medical Decision Making Risk Prescription drug management.   This patient presents to the ED for concern of headache. Differential diagnosis includes migraine, sinus infection, viral URI, tension headache, cluster headache   Medicines ordered and prescription drug management:  I ordered medication including fluids, compazine, benadryl  for migraine cocktail  Reevaluation of the patient after these medicines showed that the patient improved I have reviewed the patients home medicines and have made adjustments as needed   Problem List / ED Course:  Patient with history of migraines, anxiety, and depression presents to the ED with concerns of a headache. States that this feels typical to normal migraines but unable to get symptoms under control with home migraine medications. Endorses associated nausea, photosensitivity, and significant headache. No other acute concerns or findings such as weakness, numbness, or most severe headache of life concerning for more severe etiologies. Likely typical migraine. Will trial migraine cocktail and reassess. If symptoms not improving, will likely obtain labs and imaging to rule out more complicating cause of migraine headache. Reassessed patient about 45 minutes after migraine cocktail was started and she states her headache is gone. No acute need for labs or imaging at this time. Will anticipate discharge home and close PCP follow up. No other acute or focal concerns at this time. Encouraged patient to return to the ER if symptoms worsen or new symptoms arise. Discharged home in stable condition with mother-in-law driving patient home.   Final Clinical Impression(s) / ED Diagnoses Final diagnoses:  Other migraine without status migrainosus, not intractable    Rx / DC Orders ED Discharge Orders     None         Smitty Knudsen, PA-C 01/28/23 2338    Melene Plan, DO 01/29/23 1519

## 2023-01-28 NOTE — ED Triage Notes (Signed)
Pt has a hx of migraines Started 24 hrs ago and she has taken all the meds she has which were ineffective.   +nausea

## 2023-01-28 NOTE — Discharge Instructions (Signed)
You were seen in the emergency department today with concerns of a migraine headache.  You are given a migraine cocktail through an IV which appears to have resolved your migraine.  Please continue to try to take your migraine medications at home as needed and follow-up with your provider who may be prescribing the migraine medication.  If symptoms worsen or new symptoms arise, return to the emergency department for further evaluation.

## 2023-02-03 ENCOUNTER — Ambulatory Visit (INDEPENDENT_AMBULATORY_CARE_PROVIDER_SITE_OTHER): Payer: BC Managed Care – PPO | Admitting: Family Medicine

## 2023-02-03 VITALS — BP 124/72 | HR 81 | Temp 98.5°F | Ht 66.0 in | Wt 149.8 lb

## 2023-02-03 DIAGNOSIS — D72820 Lymphocytosis (symptomatic): Secondary | ICD-10-CM | POA: Diagnosis not present

## 2023-02-03 DIAGNOSIS — M549 Dorsalgia, unspecified: Secondary | ICD-10-CM

## 2023-02-03 DIAGNOSIS — E785 Hyperlipidemia, unspecified: Secondary | ICD-10-CM | POA: Diagnosis not present

## 2023-02-03 DIAGNOSIS — F418 Other specified anxiety disorders: Secondary | ICD-10-CM | POA: Diagnosis not present

## 2023-02-03 MED ORDER — MELOXICAM 7.5 MG PO TABS
7.5000 mg | ORAL_TABLET | Freq: Every day | ORAL | 0 refills | Status: DC
Start: 2023-02-03 — End: 2023-03-08

## 2023-02-03 NOTE — Patient Instructions (Addendum)
Gentle range of motion for back. See info below. Meloxicam once per day for next 1-2 weeks, then if not improved I recommend physical therapy. If any stomach upset stop meloxicam.  I will recheck labs. Glad to hear cough has improved.   Sorry things are tough right now. I do recommend meeting with a therapist. Try increasing lexapro to 20mg  - if that dose works better - let me know and I can send in the higher dose.   Hang in there.   Here are a few options for counseling:  [facility]:  [phone]  [facility]:  [phone]  [facility] [phone]  Acute Back Pain, Adult Acute back pain is sudden and usually short-lived. It is often caused by an injury to the muscles and tissues in the back. The injury may result from: A muscle, tendon, or ligament getting overstretched or torn. Ligaments are tissues that connect bones to each other. Lifting something improperly can cause a back strain. Wear and tear (degeneration) of the spinal disks. Spinal disks are circular tissue that provide cushioning between the bones of the spine (vertebrae). Twisting motions, such as while playing sports or doing yard work. A hit to the back. Arthritis. You may have a physical exam, lab tests, and imaging tests to find the cause of your pain. Acute back pain usually goes away with rest and home care. Follow these instructions at home: Managing pain, stiffness, and swelling Take over-the-counter and prescription medicines only as told by your health care provider. Treatment may include medicines for pain and inflammation that are taken by mouth or applied to the skin, or muscle relaxants. Your health care provider may recommend applying ice during the first 24-48 hours after your pain starts. To do this: Put ice in a plastic bag. Place a towel between your skin and the bag. Leave the ice on for 20 minutes, 2-3 times a day. Remove the ice if  your skin turns bright red. This is very important. If you cannot feel pain, heat, or cold, you have a greater risk of damage to the area. If directed, apply heat to the affected area as often as told by your health care provider. Use the heat source that your health care provider recommends, such as a moist heat pack or a heating pad. Place a towel between your skin and the heat source. Leave the heat on for 20-30 minutes. Remove the heat if your skin turns bright red. This is especially important if you are unable to feel pain, heat, or cold. You have a greater risk of getting burned. Activity  Do not stay in bed. Staying in bed for more than 1-2 days can delay your recovery. Sit up and stand up straight. Avoid leaning forward when you sit or hunching over when you stand. If you work at a desk, sit close to it so you do not need to lean over. Keep your chin tucked in. Keep your neck drawn back, and keep your elbows bent at a 90-degree angle (right angle). Sit high and close to the steering wheel when you drive. Add lower back (lumbar) support to your car seat, if needed. Take short walks on even surfaces as soon as you are able. Try to increase the length of time you walk each day. Do not sit, drive, or stand in one place for more than 30 minutes at a time. Sitting or standing for long periods of time can put stress on your back. Do  not drive or use heavy machinery while taking prescription pain medicine. Use proper lifting techniques. When you bend and lift, use positions that put less stress on your back: [place] your knees. Keep the load close to your body. Avoid twisting. Exercise regularly as told by your health care provider. Exercising helps your back heal faster and helps prevent back injuries by keeping muscles strong and flexible. Work with a physical therapist to make a safe exercise program, as recommended by your health care provider. Do any exercises as told by your physical  therapist. Lifestyle Maintain a healthy weight. Extra weight puts stress on your back and makes it difficult to have good posture. Avoid activities or situations that make you feel anxious or stressed. Stress and anxiety increase muscle tension and can make back pain worse. Learn ways to manage anxiety and stress, such as through exercise. General instructions Sleep on a firm mattress in a comfortable position. Try lying on your side with your knees slightly bent. If you lie on your back, put a pillow under your knees. Keep your head and neck in a straight line with your spine (neutral position) when using electronic equipment like smartphones or pads. To do this: Raise your smartphone or pad to look at it instead of bending your head or neck to look down. Put the smartphone or pad at the level of your face while looking at the screen. Follow your treatment plan as told by your health care provider. This may include: Cognitive or behavioral therapy. Acupuncture or massage therapy. Meditation or yoga. Contact a health care provider if: You have pain that is not relieved with rest or medicine. You have increasing pain going down into your legs or buttocks. Your pain does not improve after 2 weeks. You have pain at night. You lose weight without trying. You have a fever or chills. You develop nausea or vomiting. You develop abdominal pain. Get help right away if: You develop new bowel or bladder control problems. You have unusual weakness or numbness in your arms or legs. You feel faint. These symptoms may represent a serious problem that is an emergency. Do not wait to see if the symptoms will go away. Get medical help right away. Call your local emergency services (911 in the [place] Do not drive yourself to the hospital. Summary Acute back pain is sudden and usually short-lived. Use proper lifting techniques. When you bend and lift, use positions that put less stress on your back. Take  over-the-counter and prescription medicines only as told by your health care provider, and apply heat or ice as told. This information is not intended to replace advice given to you by your health care provider. Make sure you discuss any questions you have with your health care provider. Document Revised: 05/10/2020 Document Reviewed: 05/10/2020 Elsevier Patient Education  2024 [employer]   Managing Stress, Adult Feeling a certain amount of stress is normal. Stress helps our body and mind get ready to deal with the demands of life. Stress hormones can motivate you to do well at work and meet your responsibilities. But severe or long-term (chronic) stress can affect your mental and physical health. Chronic stress puts you at higher risk for: Anxiety and depression. Other health problems such as digestive problems, muscle aches, heart disease, high blood pressure, and stroke. What are the causes? Common causes of stress include: Demands from work, such as deadlines, feeling overworked, or having long hours. Pressures at home, such as money issues, disagreements with  a spouse, or parenting issues. Pressures from major life changes, such as divorce, moving, loss of a loved one, or chronic illness. You may be at higher risk for stress-related problems if you: Do not get enough sleep. Are in poor health. Do not have emotional support. Have a mental health disorder such as anxiety or depression. How to recognize stress Stress can make you: Have trouble sleeping. Feel sad, anxious, irritable, or overwhelmed. Lose your appetite. Overeat or want to eat unhealthy foods. Want to use drugs or alcohol. Stress can also cause physical symptoms, such as: Sore, tense muscles, especially in the shoulders and neck. Headaches. Trouble breathing. A faster heart rate. Stomach pain, nausea, or vomiting. Diarrhea or constipation. Trouble concentrating. Follow these instructions at home: Eating and  drinking Eat a healthy diet. This includes: Eating foods that are high in fiber, such as beans, whole grains, and fresh fruits and vegetables. Limiting foods that are high in fat and processed sugars, such as fried or sweet foods. Do not skip meals or overeat. Drink enough fluid to keep your urine pale yellow. Alcohol use Do not drink alcohol if: Your health care provider tells you not to drink. You are pregnant, may be pregnant, or are planning to become pregnant. Drinking alcohol is a way some people try to ease their stress. This can be dangerous, so if you drink alcohol: Limit how much you have to: 0-1 drink a day for women. 0-2 drinks a day for men. Know how much alcohol is in your drink. In the [place] one drink equals one 12 oz bottle of beer (355 mL), one 5 oz glass of wine (148 mL), or one 1 oz glass of hard liquor (44 mL). Activity  Include 30 minutes of exercise in your daily schedule. Exercise is a good stress reducer. Include time in your day for an activity that you find relaxing. Try taking a walk, going on a bike ride, reading a book, or listening to music. Schedule your time in a way that lowers stress, and keep a regular schedule. Focus on doing what is most important to get done. Lifestyle Identify the source of your stress and your reaction to it. See a therapist who can help you change unhelpful reactions. When there are stressful events: Talk about them with family, friends, or coworkers. Try to think realistically about stressful events and not ignore them or overreact. Try to find the positives in a stressful situation and not focus on the negatives. Cut back on responsibilities at work and home, if possible. Ask for help from friends or family members if you need it. Find ways to manage stress, such as: Mindfulness, meditation, or deep breathing. Yoga or tai chi. Progressive muscle relaxation. Spending time in nature. Doing art, playing music, or  reading. Making time for fun activities. Spending time with family and friends. Get support from family, friends, or spiritual resources. General instructions Get enough sleep. Try to go to sleep and get up at about the same time every day. Take over-the-counter and prescription medicines only as told by your health care provider. Do not use any products that contain nicotine or tobacco. These products include cigarettes, chewing tobacco, and vaping devices, such as e-cigarettes. If you need help quitting, ask your health care provider. Do not use drugs or smoke to deal with stress. Keep all follow-up visits. This is important. Where to find support Talk with your health care provider about stress management or finding a support group. Find a therapist  to work with you on your stress management techniques. Where to find more information [employer]ness: www.nami.org [employer] Association: [employer] Contact a health care provider if: Your stress symptoms get worse. You are unable to manage your stress at home. You are struggling to stop using drugs or alcohol. Get help right away if: You may be a danger to yourself or others. You have any thoughts of death or suicide. Get help right awayif you feel like you may hurt yourself or others, or have thoughts about taking your own life. Go to your nearest emergency room or: Call 911. Call the National Suicide Prevention Lifeline at 801-722-5019 or 988 in the [place] This is open 24 hours a day. Text the Crisis Text Line at 902-826-1718. Summary Feeling a certain amount of stress is normal, but severe or long-term (chronic) stress can affect your mental and physical health. Chronic stress can put you at higher risk for anxiety, depression, and other health problems such as digestive problems, muscle aches, heart disease, high blood pressure, and stroke. You may be at higher risk for stress-related problems if you do  not get enough sleep, are in poor health, lack emotional support, or have a mental health disorder such as anxiety or depression. Identify the source of your stress and your reaction to it. Try talking about stressful events with family, friends, or coworkers, finding a coping method, or getting support from spiritual resources. If you need more help, talk with your health care provider about finding a support group or a mental health therapist. This information is not intended to replace advice given to you by your health care provider. Make sure you discuss any questions you have with your health care provider. Document Revised: 09/12/2020 Document Reviewed: 09/10/2020 Elsevier Patient Education  2024 [employer].
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
## 2023-02-04 LAB — COMPREHENSIVE METABOLIC PANEL
ALT: 9 U/L (ref 0–35)
AST: 15 U/L (ref 0–37)
Albumin: 4.6 g/dL (ref 3.5–5.2)
Alkaline Phosphatase: 65 U/L (ref 39–117)
BUN: 10 mg/dL (ref 6–23)
CO2: 24 meq/L (ref 19–32)
Calcium: 9.4 mg/dL (ref 8.4–10.5)
Chloride: 108 meq/L (ref 96–112)
Creatinine, Ser: 0.86 mg/dL (ref 0.40–1.20)
GFR: 87.81 mL/min (ref >60.00)
Glucose, Bld: 83 mg/dL (ref 70–99)
Potassium: 4.5 meq/L (ref 3.5–5.1)
Sodium: 139 meq/L (ref 135–145)
Total Bilirubin: 0.7 mg/dL (ref 0.2–1.2)
Total Protein: 7.3 g/dL (ref 6.0–8.3)

## 2023-02-04 LAB — CBC WITH DIFFERENTIAL/PLATELET
Basophils Absolute: 0.1 10*3/uL (ref 0.0–0.1)
Basophils Relative: 1.3 % (ref 0.0–3.0)
Eosinophils Absolute: 0.1 10*3/uL (ref 0.0–0.7)
Eosinophils Relative: 2 % (ref 0.0–5.0)
HCT: 41.7 % (ref 36.0–46.0)
Hemoglobin: 14.1 g/dL (ref 12.0–15.0)
Lymphocytes Relative: 37.8 % (ref 12.0–46.0)
Lymphs Abs: 2.6 10*3/uL (ref 0.7–4.0)
MCHC: 33.9 g/dL (ref 30.0–36.0)
MCV: 88.1 fL (ref 78.0–100.0)
Monocytes Absolute: 0.5 10*3/uL (ref 0.1–1.0)
Monocytes Relative: 7.1 % (ref 3.0–12.0)
Neutro Abs: 3.5 10*3/uL (ref 1.4–7.7)
Neutrophils Relative %: 51.8 % (ref 43.0–77.0)
Platelets: 247 10*3/uL (ref 150.0–400.0)
RBC: 4.73 Mil/uL (ref 3.87–5.11)
RDW: 12.8 % (ref 11.5–15.5)
WBC: 6.8 10*3/uL (ref 4.0–10.5)

## 2023-02-04 LAB — LIPID PANEL
Cholesterol: 170 mg/dL (ref 0–200)
HDL: 46 mg/dL (ref >39.00)
LDL Cholesterol: 109 mg/dL — ABNORMAL HIGH (ref 0–99)
NonHDL: 124.02
Total CHOL/HDL Ratio: 4
Triglycerides: 75 mg/dL (ref 0.0–149.0)
VLDL: 15 mg/dL (ref 0.0–40.0)

## 2023-02-06 ENCOUNTER — Encounter: Payer: Self-pay | Admitting: Family Medicine

## 2023-02-08 DIAGNOSIS — F331 Major depressive disorder, recurrent, moderate: Secondary | ICD-10-CM | POA: Diagnosis not present

## 2023-02-08 DIAGNOSIS — F411 Generalized anxiety disorder: Secondary | ICD-10-CM | POA: Diagnosis not present

## 2023-02-10 DIAGNOSIS — Z01419 Encounter for gynecological examination (general) (routine) without abnormal findings: Secondary | ICD-10-CM | POA: Diagnosis not present

## 2023-02-10 DIAGNOSIS — Z6824 Body mass index (BMI) 24.0-24.9, adult: Secondary | ICD-10-CM | POA: Diagnosis not present

## 2023-02-11 ENCOUNTER — Other Ambulatory Visit: Payer: Self-pay | Admitting: Obstetrics & Gynecology

## 2023-02-11 DIAGNOSIS — Z1239 Encounter for other screening for malignant neoplasm of breast: Secondary | ICD-10-CM | POA: Diagnosis not present

## 2023-02-11 DIAGNOSIS — Z803 Family history of malignant neoplasm of breast: Secondary | ICD-10-CM

## 2023-02-11 LAB — HM MAMMOGRAPHY

## 2023-02-15 DIAGNOSIS — F411 Generalized anxiety disorder: Secondary | ICD-10-CM | POA: Diagnosis not present

## 2023-02-15 DIAGNOSIS — F331 Major depressive disorder, recurrent, moderate: Secondary | ICD-10-CM | POA: Diagnosis not present

## 2023-02-28 ENCOUNTER — Ambulatory Visit
Admission: RE | Admit: 2023-02-28 | Discharge: 2023-02-28 | Disposition: A | Discharge status: Home / Self Care | Payer: BC Managed Care – PPO | Source: Ambulatory Visit | Attending: Obstetrics & Gynecology | Admitting: Obstetrics & Gynecology

## 2023-02-28 DIAGNOSIS — Z803 Family history of malignant neoplasm of breast: Secondary | ICD-10-CM

## 2023-02-28 MED ORDER — GADOPICLENOL 0.5 MMOL/ML IV SOLN
6.0000 mL | Freq: Once | INTRAVENOUS | Status: AC | PRN
  Administered 2023-02-28: 6 mL via INTRAVENOUS

## 2023-03-08 ENCOUNTER — Encounter: Payer: Self-pay | Admitting: Family Medicine

## 2023-03-08 ENCOUNTER — Telehealth (INDEPENDENT_AMBULATORY_CARE_PROVIDER_SITE_OTHER): Payer: Commercial Managed Care - HMO | Admitting: Family Medicine

## 2023-03-08 DIAGNOSIS — M549 Dorsalgia, unspecified: Secondary | ICD-10-CM

## 2023-03-08 DIAGNOSIS — F418 Other specified anxiety disorders: Secondary | ICD-10-CM

## 2023-03-08 MED ORDER — ESCITALOPRAM OXALATE 20 MG PO TABS: 20.0000 mg | ORAL_TABLET | Freq: Every day | ORAL | 1 refills | Status: DC
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
## 2023-03-08 NOTE — Patient Instructions (Addendum)
 Glad to hear about the new therapist.  Continue to meet with that therapist and continue same dose of Lexapro  for now.  I do agree with you that things will continue to improve with the mind-body approach.  I do think that meeting with a physical therapist could be helpful in identifying specific areas to work on with home exercises.  Even if you can get a couple of visits in with them that should be helpful for your upper back pain.  Will recheck in 6 weeks but I am happy to meet with you sooner if any new or worsening symptoms.  If not improving, would consider meeting with a back specialist or other imaging, but I would like to try physical therapy first.  Please let me know if there are questions.  Take care!
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
## 2023-03-15 ENCOUNTER — Ambulatory Visit (INDEPENDENT_AMBULATORY_CARE_PROVIDER_SITE_OTHER): Payer: Commercial Managed Care - HMO | Admitting: Physical Therapy

## 2023-03-15 ENCOUNTER — Encounter: Payer: Self-pay | Admitting: Physical Therapy

## 2023-03-15 DIAGNOSIS — M6281 Muscle weakness (generalized): Secondary | ICD-10-CM

## 2023-03-15 DIAGNOSIS — M546 Pain in thoracic spine: Secondary | ICD-10-CM | POA: Diagnosis not present

## 2023-03-15 DIAGNOSIS — M5459 Other low back pain: Secondary | ICD-10-CM | POA: Diagnosis not present
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
## 2023-03-23 ENCOUNTER — Encounter: Payer: Self-pay | Admitting: Physical Therapy

## 2023-03-23 ENCOUNTER — Ambulatory Visit (INDEPENDENT_AMBULATORY_CARE_PROVIDER_SITE_OTHER): Payer: Commercial Managed Care - HMO | Admitting: Physical Therapy

## 2023-03-23 DIAGNOSIS — M546 Pain in thoracic spine: Secondary | ICD-10-CM | POA: Diagnosis not present

## 2023-03-23 DIAGNOSIS — M6281 Muscle weakness (generalized): Secondary | ICD-10-CM | POA: Diagnosis not present

## 2023-03-23 DIAGNOSIS — M5459 Other low back pain: Secondary | ICD-10-CM | POA: Diagnosis not present

## 2023-03-30 ENCOUNTER — Encounter: Payer: Commercial Managed Care - HMO | Admitting: Physical Therapy

## 2023-04-01 ENCOUNTER — Ambulatory Visit (INDEPENDENT_AMBULATORY_CARE_PROVIDER_SITE_OTHER): Payer: Commercial Managed Care - HMO | Admitting: Physical Therapy

## 2023-04-01 ENCOUNTER — Encounter: Payer: Self-pay | Admitting: Physical Therapy

## 2023-04-01 DIAGNOSIS — M6281 Muscle weakness (generalized): Secondary | ICD-10-CM | POA: Diagnosis not present

## 2023-04-01 DIAGNOSIS — M546 Pain in thoracic spine: Secondary | ICD-10-CM

## 2023-04-01 DIAGNOSIS — M5459 Other low back pain: Secondary | ICD-10-CM | POA: Diagnosis not present
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
## 2023-04-06 ENCOUNTER — Encounter: Payer: Commercial Managed Care - HMO | Admitting: Physical Therapy

## 2023-04-14 ENCOUNTER — Encounter: Payer: Self-pay | Admitting: Physical Therapy

## 2023-04-14 ENCOUNTER — Ambulatory Visit (INDEPENDENT_AMBULATORY_CARE_PROVIDER_SITE_OTHER): Payer: Commercial Managed Care - HMO | Admitting: Physical Therapy

## 2023-04-14 DIAGNOSIS — M6281 Muscle weakness (generalized): Secondary | ICD-10-CM

## 2023-04-14 DIAGNOSIS — M5459 Other low back pain: Secondary | ICD-10-CM

## 2023-04-14 DIAGNOSIS — M546 Pain in thoracic spine: Secondary | ICD-10-CM
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
## 2023-04-21 ENCOUNTER — Encounter: Payer: Commercial Managed Care - HMO | Admitting: Family Medicine

## 2023-04-22 ENCOUNTER — Encounter: Payer: Commercial Managed Care - HMO | Admitting: Physical Therapy

## 2023-04-28 ENCOUNTER — Telehealth: Payer: Commercial Managed Care - HMO | Admitting: Family Medicine

## 2023-04-28 ENCOUNTER — Encounter: Payer: Self-pay | Admitting: Family Medicine

## 2023-04-28 VITALS — Temp 99.0°F | Ht 66.5 in | Wt 140.0 lb

## 2023-04-28 DIAGNOSIS — F418 Other specified anxiety disorders: Secondary | ICD-10-CM

## 2023-04-28 DIAGNOSIS — M549 Dorsalgia, unspecified: Secondary | ICD-10-CM | POA: Diagnosis not present

## 2023-04-28 MED ORDER — ESCITALOPRAM OXALATE 20 MG PO TABS: 20.0000 mg | ORAL_TABLET | Freq: Every day | ORAL | 1 refills | Status: DC

## 2023-04-28 NOTE — Patient Instructions (Signed)
 Good talking with you today.  Glad to hear things are doing well with the Lexapro 20 mg, lets stay at that dose for now.  Follow-up in 6 months but happy to see you sooner if needed.  Mucinex is fine over-the-counter if needed for cough.  Saline nasal spray for nasal congestion or if more allergy type symptoms Flonase nasal spray or Zyrtec are fine.  If you needed a decongestant, Afrin nasal spray for no more than 3 days at a time, but I would start with the medications above.  Let me know if there are further questions and take care.

## 2023-04-28 NOTE — Progress Notes (Signed)
 Virtual Visit via Video Note Not online at 450pm - repeat link sent.  I connected with Lori Rice on 04/28/23 at 4:53 PM by a video enabled telemedicine application and verified that I am speaking with the correct person using two identifiers.  Patient location: home, consent given to discuss PHI with spouse present.  My location: office - [place].    I discussed the limitations, risks, security and privacy concerns of performing an evaluation and management service by telephone and the availability of in person appointments. I also discussed with the patient that there may be a patient responsible charge related to this service. The patient expressed understanding and agreed to proceed, consent obtained  Chief complaint:  Chief Complaint  Patient presents with   Follow-up    Pt is here for F/U Pt reports no concerns with her medication. Pt reports her anxiety has been doing good. Pt would like to know if she can get a recommendation on cold medication id any sx should arise.  Pt is unable to get B/P at the time of visit     History of Present Illness: Lori Rice is a 38 y.o. female  Depression with anxiety: Last discussed January 19.  Was doing better at the higher dose of Lexapro 20 mg, and had just started meeting with a therapist.  Appear to be a good fit, and helping her with stress management as well as anxiety management, plan for mind-body approach which we felt would also help some of her other areas of concern including upper back pain, headaches, and had been referred to physical therapy for upper back pain.  Feels like current dose is still working well, no side effects on current dose and working well.  Not meeting with therapist anymore. Does not feel like therapy needed at this time. Also difficult with scheduling, and costly.  Feels like in good place. New church [employer]) that has been a good fit for the family. Women's group on Monday night has been  helpful. Some room for increased activity, but difficult with schedule.  PT for back is helping. Working well. Few more sessions, then transition to HEP planned.    Question about cold medication: Prior used sudafed with cold/congestion in past. Trying to avoid stimulants.      Patient Active Problem List   Diagnosis Date Noted   History of bradycardia 06/23/2022   History of cesarean section 06/23/2022   History of migraine 06/23/2022   Migraine 06/23/2022   Mixed anxiety and depressive disorder 06/23/2022   RhD negative 06/23/2022   Single umbilical artery 06/23/2022   Subchorionic hematoma 06/23/2022   VBAC (vaginal birth after Cesarean) 06/12/2022   Pregnancy 06/11/2022   Fall 05/22/2022   At high risk for breast cancer 05/01/2021   Encounter for induction of labor 12/15/2019   Incomplete miscarriage 02/26/2019   Vaginal bleeding affecting early pregnancy 02/26/2019   Encounter for procreative genetic counseling 11/08/2018   Situational anxiety 11/08/2015   Palpitations 06/19/2013   Dyspnea 06/19/2013   Retrocecal appendicitis 07/21/2012   Past Medical History:  Diagnosis Date   Anxiety    Bradycardia    Depression    Migraines    Mononucleosis    Palpitations    Tachycardia    Past Surgical History:  Procedure Laterality Date   CESAREAN SECTION N/A 12/16/2019   Procedure: CESAREAN SECTION;  Surgeon: Candice Camp, MD;  Location: [facility];  Service: Obstetrics;  Laterality: N/A;   LAPAROSCOPIC APPENDECTOMY N/A 07/21/2012  Procedure: APPENDECTOMY LAPAROSCOPIC;  Surgeon: Currie Paris, MD;  Location: [facility];  Service: General;  Laterality: N/A;   TONSILLECTOMY     TONSILLECTOMY     No Known Allergies Prior to Admission medications   Medication Sig Start Date End Date Taking? Authorizing Provider  amphetamine-dextroamphetamine (ADDERALL XR) 20 MG 24 hr capsule Take 20 mg by mouth daily. 10/01/22  Yes [provider]  amphetamine-dextroamphetamine  (ADDERALL) 10 MG tablet Take 10 mg by mouth daily. 12/20/22  Yes [provider]  butalbital-acetaminophen-caffeine (FIORICET) 50-325-40 MG tablet Take 1 tablet by mouth every 4 (four) hours as needed.   Yes [provider]  eletriptan (RELPAX) 40 MG tablet Take by mouth.   Yes [provider]  escitalopram (LEXAPRO) 20 MG tablet Take 1 tablet (20 mg total) by mouth daily. 03/08/23  Yes Shade Flood, MD  methocarbamol (ROBAXIN) 500 MG tablet Take 1 tablet (500 mg total) by mouth 2 (two) times daily. 01/05/23  Yes Jeannie Fend, PA-C  Prenatal-FeFum-FA-DHA w/o A (PRENATAL + DHA PO)    Yes [provider]  topiramate (TOPAMAX) 50 MG tablet Take 3 tablets by mouth daily.   Yes [provider]   Social History   Socioeconomic History   Marital status: Married    Spouse name: Not on file   Number of children: 0   Years of education: Not on file   Highest education level: Bachelor's degree (e.g., BA, AB, BS)  Occupational History   Occupation: Environmental health practitioner: [employer]  Tobacco Use   Smoking status: Never   Smokeless tobacco: Never  Vaping Use   Vaping status: Never Used  Substance and Sexual Activity   Alcohol use: Not Currently   Drug use: No   Sexual activity: Not Currently    Partners: Male    Birth control/protection: None    Comment: last IC in past week per patient  Other Topics Concern   Not on file  Social History Narrative   Not on file   Social Drivers of Health   Financial Resource Strain: Low Risk  (01/30/2023)   Overall Financial Resource Strain (CARDIA)    Difficulty of Paying Living Expenses: Not hard at all  Food Insecurity: No Food Insecurity (01/30/2023)   Hunger Vital Sign    Worried About Running Out of Food in the Last Year: Never true    Ran Out of Food in the Last Year: Never true  Transportation Needs: No Transportation Needs (01/30/2023)   PRAPARE - Scientist, research (physical sciences) (Medical): No    Lack of Transportation (Non-Medical): No  Physical Activity: Sufficiently Active (01/30/2023)   Exercise Vital Sign    Days of Exercise per Week: 3 days    Minutes of Exercise per Session: 50 min  Stress: Stress Concern Present (01/30/2023)   [employer] of Occupational Health - Occupational Stress Questionnaire    Feeling of Stress : To some extent  Social Connections: Socially Integrated (01/30/2023)   Social Connection and Isolation Panel [NHANES]    Frequency of Communication with Friends and Family: More than three times a week    Frequency of Social Gatherings with Friends and Family: More than three times a week    Attends Religious Services: 1 to 4 times per year    Active Member of [employer] or Organizations: Yes    Attends Banker Meetings: More than 4 times per year    Marital Status:  Married  Intimate Partner Violence: Unknown (06/22/2022)   Received from [facility], [facility]   HITS    Physically Hurt: Not on file    Insult or Talk Down To: Not on file    Threaten Physical Harm: Not on file    Scream or Curse: Not on file    Observations/Objective: Vitals:   04/28/23 1617  Temp: 99 F (37.2 C)  Weight: 140 lb (63.5 kg)  Height: 5' 6.5" (1.689 m)  Nontoxic appearance on video, speaking full sentences without respiratory distress.  No wheeze or stridor.  Euthymic mood, appropriate responses.  All questions answered with understanding of plan expressed.  Assessment and Plan: Depression with anxiety -Stable with Lexapro, continue same dose.  Prior counseling, improved.  Doing well now with stress management, new church and small group.  Continue same, 64-month follow-up if stable.  Upper back pain  -Improved with PT, continue same.  RTC precautions.  Asymptomatic at this time but discussed over-the-counter medication options if needed for allergies or cold symptoms.  Saline nasal spray, Mucinex, Flonase, Zyrtec,  short-term Afrin only if needed.  Follow Up Instructions:  30-month follow-up I discussed the assessment and treatment plan with the patient. The patient was provided an opportunity to ask questions and all were answered. The patient agreed with the plan and demonstrated an understanding of the instructions.   The patient was advised to call back or seek an in-person evaluation if the symptoms worsen or if the condition fails to improve as anticipated.   Shade Flood, MD

## 2023-04-29 ENCOUNTER — Encounter: Payer: Commercial Managed Care - HMO | Admitting: Physical Therapy

## 2023-05-03 ENCOUNTER — Ambulatory Visit (INDEPENDENT_AMBULATORY_CARE_PROVIDER_SITE_OTHER): Payer: Commercial Managed Care - HMO | Admitting: Physical Therapy

## 2023-05-03 ENCOUNTER — Encounter: Payer: Self-pay | Admitting: Physical Therapy

## 2023-05-03 DIAGNOSIS — M546 Pain in thoracic spine: Secondary | ICD-10-CM

## 2023-05-03 DIAGNOSIS — M6281 Muscle weakness (generalized): Secondary | ICD-10-CM

## 2023-05-03 DIAGNOSIS — M5459 Other low back pain: Secondary | ICD-10-CM | POA: Diagnosis not present
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
## 2023-05-05 ENCOUNTER — Encounter: Payer: Self-pay | Admitting: Family Medicine

## 2023-05-06 ENCOUNTER — Encounter: Payer: Self-pay | Admitting: Obstetrics & Gynecology

## 2023-05-12 ENCOUNTER — Encounter: Payer: Self-pay | Admitting: Family Medicine

## 2023-07-22 ENCOUNTER — Encounter: Payer: Self-pay | Admitting: Family Medicine

## 2023-07-27 ENCOUNTER — Encounter: Payer: Self-pay | Admitting: Family Medicine

## 2023-09-13 NOTE — Progress Notes (Signed)
 This encounter was created in error - please disregard.

## 2023-10-23 ENCOUNTER — Other Ambulatory Visit: Payer: Self-pay | Admitting: Medical Genetics

## 2023-10-28 ENCOUNTER — Encounter: Payer: Self-pay | Admitting: Family Medicine

## 2023-10-28 ENCOUNTER — Ambulatory Visit: Payer: Commercial Managed Care - HMO | Admitting: Family Medicine

## 2023-10-28 ENCOUNTER — Other Ambulatory Visit: Payer: Self-pay | Admitting: Obstetrics & Gynecology

## 2023-10-28 VITALS — BP 100/68 | HR 64 | Wt 133.0 lb

## 2023-10-28 DIAGNOSIS — F418 Other specified anxiety disorders: Secondary | ICD-10-CM | POA: Diagnosis not present

## 2023-10-28 DIAGNOSIS — Z8669 Personal history of other diseases of the nervous system and sense organs: Secondary | ICD-10-CM

## 2023-10-28 DIAGNOSIS — Z803 Family history of malignant neoplasm of breast: Secondary | ICD-10-CM

## 2023-10-28 MED ORDER — ESCITALOPRAM OXALATE 20 MG PO TABS: 20.0000 mg | ORAL_TABLET | Freq: Every day | ORAL | 1 refills | Status: DC

## 2023-10-28 NOTE — Progress Notes (Signed)
 Subjective:  Patient ID: Lori Rice, female    DOB: [birth year]  Age: 38 y.o. MRN: [id number]  CC:  Chief Complaint  Patient presents with   Medical Management of Chronic Issues    6 month follow up      HPI Lori Rice presents for   Depression: With anxiety, last visit by video on 04/28/2023. Improved on higher dose Lexapro .  Symptoms had improved to where she did not feel like she needed to meet a therapist at that time.  She was in a good place with reassurance that had been a good fit for the family, women's group that had also been helpful.  PT for her back had been helpful. Continues on Lexapro  20 mg daily. Working ok.  Anxiety started to increase past few weeks. Increasing demands in life.  No current therapist, not a good fit.  Plan for womens group in fall.  Working on time [employer], setting timers, schedule.  Continues Adderall under the care of [place] attention specialist for ADD.  No med changes. Looking at self mgt strategies - Dtr. will be going back to school. This should help with structure. Would like to continue same meds for now.   Exercise in yard, garden. Will be building shed for office.  Yoga on Saturday mornings.   Saw GYN this morning, mother with breast CA at age 30. Plan for breast MRI, mammogram yearly. Planning on spacing out to alternating every 6 months with one of those studies. Some tinging sensation in left breast at times past week - saw GYN this am - no concerns on exam, symmetrical. Plan for breast ultrasound for further eval. No dyspnea, cough. Prior MRI clear.   On botox for migraines - every 3 months - started in July - followed by neuro.      10/28/2023   12:47 PM 03/08/2023   10:41 AM 02/03/2023    2:53 PM 01/27/2023   11:17 AM 09/30/2022   10:35 AM  Depression screen PHQ 2/9  Decreased Interest 0 0 0 0 0  Down, Depressed, Hopeless 1 0 1 0 0  PHQ - 2 Score 1 0 1 0 0  Altered sleeping 0 0 1 0 0  Tired, decreased energy 0 0 0 0 1   Change in appetite 0 0 2 0 1  Feeling bad or failure about yourself  0 0 1 0 0  Trouble concentrating 0 0 0 0 0  Moving slowly or fidgety/restless 0 0 0 0 0  Suicidal thoughts 0 0 0 0 0  PHQ-9 Score 1 0 5 0 2  Difficult doing work/chores Not difficult at all   Not difficult at all       10/28/2023   12:47 PM 03/08/2023   10:42 AM 02/03/2023    2:55 PM 09/30/2022   10:35 AM  GAD 7 : Generalized Anxiety Score  Nervous, Anxious, on Edge 2 1 1 1   Control/stop worrying 1 1 1  0  Worry too much - different things 1 1 1 1   Trouble relaxing 1 0 1 1  Restless 1 0 1 0  Easily annoyed or irritable 0 0 1 0  Afraid - awful might happen 1 1 1  0  Total GAD 7 Score 7 4 7 3   Anxiety Difficulty Not difficult at all        History Patient Active Problem List   Diagnosis Date Noted   History of bradycardia 06/23/2022   History of cesarean section 06/23/2022  History of migraine 06/23/2022   Migraine 06/23/2022   Mixed anxiety and depressive disorder 06/23/2022   RhD negative 06/23/2022   Single umbilical artery 06/23/2022   Subchorionic hematoma 06/23/2022   VBAC (vaginal birth after Cesarean) 06/12/2022   Pregnancy 06/11/2022   Fall 05/22/2022   At high risk for breast cancer 05/01/2021   Encounter for induction of labor 12/15/2019   Incomplete miscarriage 02/26/2019   Vaginal bleeding affecting early pregnancy 02/26/2019   Encounter for procreative genetic counseling 11/08/2018   Situational anxiety 11/08/2015   Palpitations 06/19/2013   Dyspnea 06/19/2013   Retrocecal appendicitis 07/21/2012   Past Medical History:  Diagnosis Date   Anxiety    Bradycardia    Depression    Migraines    Mononucleosis    Palpitations    Tachycardia    Past Surgical History:  Procedure Laterality Date   APPENDECTOMY  April 2014   CESAREAN SECTION N/A 12/16/2019   Procedure: CESAREAN SECTION;  Surgeon: Marget Lenis, MD;  Location: [facility];  Service: Obstetrics;  Laterality: N/A;    LAPAROSCOPIC APPENDECTOMY N/A 07/21/2012   Procedure: APPENDECTOMY LAPAROSCOPIC;  Surgeon: Sherlean JINNY Laughter, MD;  Location: [facility] ORS;  Service: General;  Laterality: N/A;   TONSILLECTOMY     TONSILLECTOMY     No Known Allergies Prior to Admission medications   Medication Sig Start Date End Date Taking? Authorizing Provider  amphetamine-dextroamphetamine (ADDERALL XR) 20 MG 24 hr capsule Take 20 mg by mouth daily. 10/01/22  Yes [provider]  amphetamine-dextroamphetamine (ADDERALL) 10 MG tablet Take 10 mg by mouth daily. 12/20/22  Yes [provider]  butalbital -acetaminophen -caffeine  (FIORICET) 50-325-40 MG tablet Take 1 tablet by mouth every 4 (four) hours as needed.   Yes [provider]  eletriptan (RELPAX) 40 MG tablet Take by mouth.   Yes [provider]  escitalopram  (LEXAPRO ) 20 MG tablet Take 1 tablet (20 mg total) by mouth daily. 04/28/23  Yes Levora Reyes SAUNDERS, MD  methocarbamol  (ROBAXIN ) 500 MG tablet Take 1 tablet (500 mg total) by mouth 2 (two) times daily. 01/05/23  Yes Beverley Leita LABOR, PA-C  Prenatal-FeFum-FA-DHA w/o A (PRENATAL + DHA PO)    Yes [provider]  topiramate  (TOPAMAX ) 50 MG tablet Take 3 tablets by mouth daily.   Yes [provider]   Social History   Socioeconomic History   Marital status: Married    Spouse name: Not on file   Number of children: 0   Years of education: Not on file   Highest education level: Bachelor's degree (e.g., BA, AB, BS)  Occupational History   Occupation: Environmental health practitioner: [employer]  Tobacco Use   Smoking status: Never   Smokeless tobacco: Never  Vaping Use   Vaping status: Never Used  Substance and Sexual Activity   Alcohol use: Not Currently   Drug use: No   Sexual activity: Not Currently    Partners: Male    Birth control/protection: None    Comment: last IC in past week per patient  Other Topics Concern   Not on file  Social History Narrative    Not on file   Social Drivers of Health   Financial Resource Strain: Low Risk  (10/27/2023)   Overall Financial Resource Strain (CARDIA)    Difficulty of Paying Living Expenses: Not hard at all  Food Insecurity: No Food Insecurity (10/27/2023)   Hunger Vital Sign    Worried About Running Out of Food in the Last  Year: Never true    Ran Out of Food in the Last Year: Never true  Transportation Needs: No Transportation Needs (10/27/2023)   PRAPARE - Administrator, Civil Service (Medical): No    Lack of Transportation (Non-Medical): No  Physical Activity: Sufficiently Active (10/27/2023)   Exercise Vital Sign    Days of Exercise per Week: 3 days    Minutes of Exercise per Session: 60 min  Stress: Stress Concern Present (10/27/2023)   [employer] of Occupational Health - Occupational Stress Questionnaire    Feeling of Stress: To some extent  Social Connections: Socially Integrated (10/27/2023)   Social Connection and Isolation Panel    Frequency of Communication with Friends and Family: More than three times a week    Frequency of Social Gatherings with Friends and Family: More than three times a week    Attends Religious Services: More than 4 times per year    Active Member of [employer] or Organizations: Yes    Attends Banker Meetings: More than 4 times per year    Marital Status: Married  Catering manager Violence: Unknown (06/22/2022)   Received from [facility]   HITS    Physically Hurt: Not on file    Insult or Talk Down To: Not on file    Threaten Physical Harm: Not on file    Scream or Curse: Not on file    Review of Systems Per HPI.   Objective:   Vitals:   10/28/23 1241  BP: 100/68  Pulse: 64  SpO2: 98%  Weight: 133 lb (60.3 kg)    Physical Exam Vitals reviewed.  Constitutional:      General: She is not in acute distress.    Appearance: Normal appearance. She is well-developed.  HENT:     Head: Normocephalic and atraumatic.   Cardiovascular:     Rate and Rhythm: Normal rate.  Pulmonary:     Effort: Pulmonary effort is normal.  Neurological:     Mental Status: She is alert and oriented to person, place, and time.  Psychiatric:        Mood and Affect: Mood normal.     Assessment & Plan:  Lori Rice is a 38 y.o. female . Depression with anxiety - Plan: escitalopram  (LEXAPRO ) 20 MG tablet Some worsening of control with situational stressors, as above.  Complicated by ADD which also can impact some timing, scheduling.  I do anticipate improvement with work on improved scheduling, setting timers, time management strategies.  Recent visit with ADD specialist without med changes.  Advised to let me know in the next month to 6 weeks if not improving at that time we could schedule separate visit to review medication options or possibly restarting counseling.  Otherwise if stable we will follow-up in 6 months.  Briefly discussed her ultrasound plan for left breast sensation, follow-up if new chest pain, dyspnea, or persistent symptoms.  Continue follow-up with headache specialist, now on Botox treatment.   Meds ordered this encounter  Medications   escitalopram  (LEXAPRO ) 20 MG tablet    Sig: Take 1 tablet (20 mg total) by mouth daily.    Dispense:  90 tablet    Refill:  1   Patient Instructions  Thanks for coming today.  I do think the anxiety symptoms, stress symptoms will improve with some increased structure, working on timing and setting timers, scheduling.  Continue to stay active, some form of exercise throughout the week.  Time to yourself is important,  continue to work on communicating those needs.  See other information below.  Lets stay at the same dose of Lexapro  for now.  If you feel like symptoms are not improving in the next 4 to 6 weeks, please schedule another visit and we can discuss some options.  Hang in there.  Managing Anxiety, Adult After being diagnosed with anxiety, you may be relieved to  know why you have felt or behaved a certain way. You may also feel overwhelmed about the treatment ahead and what it will mean for your life. With care and support, you can manage your anxiety. How to manage lifestyle changes Understanding the difference between stress and anxiety Although stress can play a role in anxiety, it is not the same as anxiety. Stress is your body's reaction to life changes and events, both good and bad. Stress is often caused by something external, such as a deadline, test, or competition. It normally goes away after the event has ended and will last just a few hours. But, stress can be ongoing and can lead to more than just stress. Anxiety is caused by something internal, such as imagining a terrible outcome or worrying that something will go wrong that will greatly upset you. Anxiety often does not go away even after the event is over, and it can become a long-term (chronic) worry. Lowering stress and anxiety Talk with your health care provider or a counselor to learn more about lowering anxiety and stress. They may suggest tension-reduction techniques, such as: Music. Spend time creating or listening to music that you enjoy and that inspires you. Mindfulness-based meditation. Practice being aware of your normal breaths while not trying to control your breathing. It can be done while sitting or walking. Centering prayer. Focus on a word, phrase, or sacred image that means something to you and brings you peace. Deep breathing. Expand your stomach and inhale slowly through your nose. Hold your breath for 3-5 seconds. Then breathe out slowly, letting your stomach muscles relax. Self-talk. Learn to notice and spot thought patterns that lead to anxiety reactions. Change those patterns to thoughts that feel peaceful. Muscle relaxation. Take time to tense muscles and then relax them. Choose a tension-reduction technique that fits your lifestyle and personality. These techniques  take time and practice. Set aside 5-15 minutes a day to do them. Specialized therapists can offer counseling and training in these techniques. The training to help with anxiety may be covered by some insurance plans. Other things you can do to manage stress and anxiety include: Keeping a stress diary. This can help you learn what triggers your reaction and then learn ways to manage your response. Thinking about how you react to certain situations. You may not be able to control everything, but you can control your response. Making time for activities that help you relax and not feeling guilty about spending your time in this way. Doing visual imagery. This involves imagining or creating mental pictures to help you relax. Practicing yoga. Through yoga poses, you can lower tension and relax.  Medicines Medicines for anxiety include: Antidepressant medicines. These are usually prescribed for long-term daily control. Anti-anxiety medicines. These may be added in severe cases, especially when panic attacks occur. When used together, medicines, psychotherapy, and tension-reduction techniques may be the most effective treatment. Relationships Relationships can play a big part in helping you recover. Spend more time connecting with trusted friends and family members. Think about going to couples counseling if you have a partner, taking  family education classes, or going to family therapy. Therapy can help you and others better understand your anxiety. How to recognize changes in your anxiety Everyone responds differently to treatment for anxiety. Recovery from anxiety happens when symptoms lessen and stop interfering with your daily life at home or work. This may mean that you will start to: Have better concentration and focus. Worry will interfere less in your daily thinking. Sleep better. Be less irritable. Have more energy. Have improved memory. Try to recognize when your condition is getting worse.  Contact your provider if your symptoms interfere with home or work and you feel like your condition is not improving. Follow these instructions at home: Activity Exercise. Adults should: Exercise for at least 150 minutes each week. The exercise should increase your heart rate and make you sweat (moderate-intensity exercise). Do strengthening exercises at least twice a week. Get the right amount and quality of sleep. Most adults need 7-9 hours of sleep each night. Lifestyle  Eat a healthy diet that includes plenty of vegetables, fruits, whole grains, low-fat dairy products, and lean protein. Do not eat a lot of foods that are high in fats, added sugars, or salt (sodium). Make choices that simplify your life. Do not use any products that contain nicotine or tobacco. These products include cigarettes, chewing tobacco, and vaping devices, such as e-cigarettes. If you need help quitting, ask your provider. Avoid caffeine , alcohol, and certain over-the-counter cold medicines. These may make you feel worse. Ask your pharmacist which medicines to avoid. General instructions Take over-the-counter and prescription medicines only as told by your provider. Keep all follow-up visits. This is to make sure you are managing your anxiety well or if you need more support. Where to find support You can get help and support from: Self-help groups. Online and [employer]. A trusted spiritual leader. Couples counseling. Family education classes. Family therapy. Where to find more information You may find that joining a support group helps you deal with your anxiety. The following sources can help you find counselors or support groups near you: Mental Health America: mentalhealthamerica.net Anxiety and Depression Association of [place] (ADAA): adaa.org The First American on Mental Illness (NAMI): nami.org Contact a health care provider if: You have a hard time staying focused or finishing  tasks. You spend many hours a day feeling worried about everyday life. You are very tired because you cannot stop worrying. You start to have headaches or often feel tense. You have chronic nausea or diarrhea. Get help right away if: Your heart feels like it is racing. You have shortness of breath. You have thoughts of hurting yourself or others. Get help right away if you feel like you may hurt yourself or others, or have thoughts about taking your own life. Go to your nearest emergency room or: Call 911. Call the National Suicide Prevention Lifeline at 804-317-4617 or 988. This is open 24 hours a day. Text the Crisis Text Line at 781-747-3152. This information is not intended to replace advice given to you by your health care provider. Make sure you discuss any questions you have with your health care provider. Document Revised: 11/25/2021 Document Reviewed: 06/09/2020 Elsevier Patient Education  2024 Elsevier Inc.    Signed,   Reyes Pines, MD [facility] 10/28/23 1:37 PM

## 2023-10-28 NOTE — Patient Instructions (Signed)
 Thanks for coming today.  I do think the anxiety symptoms, stress symptoms will improve with some increased structure, working on timing and setting timers, scheduling.  Continue to stay active, some form of exercise throughout the week.  Time to yourself is important, continue to work on communicating those needs.  See other information below.  Lets stay at the same dose of Lexapro  for now.  If you feel like symptoms are not improving in the next 4 to 6 weeks, please schedule another visit and we can discuss some options.  Hang in there.  Managing Anxiety, Adult After being diagnosed with anxiety, you may be relieved to know why you have felt or behaved a certain way. You may also feel overwhelmed about the treatment ahead and what it will mean for your life. With care and support, you can manage your anxiety. How to manage lifestyle changes Understanding the difference between stress and anxiety Although stress can play a role in anxiety, it is not the same as anxiety. Stress is your body's reaction to life changes and events, both good and bad. Stress is often caused by something external, such as a deadline, test, or competition. It normally goes away after the event has ended and will last just a few hours. But, stress can be ongoing and can lead to more than just stress. Anxiety is caused by something internal, such as imagining a terrible outcome or worrying that something will go wrong that will greatly upset you. Anxiety often does not go away even after the event is over, and it can become a long-term (chronic) worry. Lowering stress and anxiety Talk with your health care provider or a counselor to learn more about lowering anxiety and stress. They may suggest tension-reduction techniques, such as: Music. Spend time creating or listening to music that you enjoy and that inspires you. Mindfulness-based meditation. Practice being aware of your normal breaths while not trying to control your  breathing. It can be done while sitting or walking. Centering prayer. Focus on a word, phrase, or sacred image that means something to you and brings you peace. Deep breathing. Expand your stomach and inhale slowly through your nose. Hold your breath for 3-5 seconds. Then breathe out slowly, letting your stomach muscles relax. Self-talk. Learn to notice and spot thought patterns that lead to anxiety reactions. Change those patterns to thoughts that feel peaceful. Muscle relaxation. Take time to tense muscles and then relax them. Choose a tension-reduction technique that fits your lifestyle and personality. These techniques take time and practice. Set aside 5-15 minutes a day to do them. Specialized therapists can offer counseling and training in these techniques. The training to help with anxiety may be covered by some insurance plans. Other things you can do to manage stress and anxiety include: Keeping a stress diary. This can help you learn what triggers your reaction and then learn ways to manage your response. Thinking about how you react to certain situations. You may not be able to control everything, but you can control your response. Making time for activities that help you relax and not feeling guilty about spending your time in this way. Doing visual imagery. This involves imagining or creating mental pictures to help you relax. Practicing yoga. Through yoga poses, you can lower tension and relax.  Medicines Medicines for anxiety include: Antidepressant medicines. These are usually prescribed for long-term daily control. Anti-anxiety medicines. These may be added in severe cases, especially when panic attacks occur. When used together, medicines,  psychotherapy, and tension-reduction techniques may be the most effective treatment. Relationships Relationships can play a big part in helping you recover. Spend more time connecting with trusted friends and family members. Think about going  to couples counseling if you have a partner, taking family education classes, or going to family therapy. Therapy can help you and others better understand your anxiety. How to recognize changes in your anxiety Everyone responds differently to treatment for anxiety. Recovery from anxiety happens when symptoms lessen and stop interfering with your daily life at home or work. This may mean that you will start to: Have better concentration and focus. Worry will interfere less in your daily thinking. Sleep better. Be less irritable. Have more energy. Have improved memory. Try to recognize when your condition is getting worse. Contact your provider if your symptoms interfere with home or work and you feel like your condition is not improving. Follow these instructions at home: Activity Exercise. Adults should: Exercise for at least 150 minutes each week. The exercise should increase your heart rate and make you sweat (moderate-intensity exercise). Do strengthening exercises at least twice a week. Get the right amount and quality of sleep. Most adults need 7-9 hours of sleep each night. Lifestyle  Eat a healthy diet that includes plenty of vegetables, fruits, whole grains, low-fat dairy products, and lean protein. Do not eat a lot of foods that are high in fats, added sugars, or salt (sodium). Make choices that simplify your life. Do not use any products that contain nicotine or tobacco. These products include cigarettes, chewing tobacco, and vaping devices, such as e-cigarettes. If you need help quitting, ask your provider. Avoid caffeine , alcohol, and certain over-the-counter cold medicines. These may make you feel worse. Ask your pharmacist which medicines to avoid. General instructions Take over-the-counter and prescription medicines only as told by your provider. Keep all follow-up visits. This is to make sure you are managing your anxiety well or if you need more support. Where to find  support You can get help and support from: Self-help groups. Online and [employer]. A trusted spiritual leader. Couples counseling. Family education classes. Family therapy. Where to find more information You may find that joining a support group helps you deal with your anxiety. The following sources can help you find counselors or support groups near you: Mental Health America: mentalhealthamerica.net Anxiety and Depression Association of [place] (ADAA): adaa.org The First American on Mental Illness (NAMI): nami.org Contact a health care provider if: You have a hard time staying focused or finishing tasks. You spend many hours a day feeling worried about everyday life. You are very tired because you cannot stop worrying. You start to have headaches or often feel tense. You have chronic nausea or diarrhea. Get help right away if: Your heart feels like it is racing. You have shortness of breath. You have thoughts of hurting yourself or others. Get help right away if you feel like you may hurt yourself or others, or have thoughts about taking your own life. Go to your nearest emergency room or: Call 911. Call the National Suicide Prevention Lifeline at 510-347-7367 or 988. This is open 24 hours a day. Text the Crisis Text Line at 416 844 0965. This information is not intended to replace advice given to you by your health care provider. Make sure you discuss any questions you have with your health care provider. Document Revised: 11/25/2021 Document Reviewed: 06/09/2020 Elsevier Patient Education  2024 [employer].

## 2023-10-29 ENCOUNTER — Other Ambulatory Visit: Payer: Self-pay | Admitting: Obstetrics & Gynecology

## 2023-10-29 DIAGNOSIS — N644 Mastodynia: Secondary | ICD-10-CM

## 2023-11-03 ENCOUNTER — Ambulatory Visit
Admission: RE | Admit: 2023-11-03 | Discharge: 2023-11-03 | Disposition: A | Discharge status: Home / Self Care | Source: Ambulatory Visit | Attending: Obstetrics & Gynecology | Admitting: Obstetrics & Gynecology

## 2023-11-03 DIAGNOSIS — N644 Mastodynia: Secondary | ICD-10-CM

## 2023-12-15 ENCOUNTER — Other Ambulatory Visit: Payer: Self-pay | Admitting: Medical Genetics

## 2023-12-15 DIAGNOSIS — Z006 Encounter for examination for normal comparison and control in clinical research program: Secondary | ICD-10-CM

## 2023-12-23 ENCOUNTER — Other Ambulatory Visit: Payer: Self-pay

## 2024-01-24 LAB — GENECONNECT MOLECULAR SCREEN: Genetic Analysis Overall Interpretation: NEGATIVE

## 2024-02-09 ENCOUNTER — Telehealth: Admitting: Physician Assistant

## 2024-02-09 ENCOUNTER — Ambulatory Visit: Payer: Self-pay

## 2024-02-09 ENCOUNTER — Telehealth

## 2024-02-09 DIAGNOSIS — B9689 Other specified bacterial agents as the cause of diseases classified elsewhere: Secondary | ICD-10-CM

## 2024-02-09 MED ORDER — FLUTICASONE PROPIONATE 50 MCG/ACT NA SUSP: 2.0000 | Freq: Every day | NASAL | 0 refills | Status: AC

## 2024-02-09 MED ORDER — AMOXICILLIN-POT CLAVULANATE 875-125 MG PO TABS: 1.0000 | ORAL_TABLET | Freq: Two times a day (BID) | ORAL | 0 refills | Status: AC

## 2024-02-09 NOTE — Telephone Encounter (Signed)
 Noted

## 2024-02-09 NOTE — Progress Notes (Signed)

## 2024-02-09 NOTE — Telephone Encounter (Signed)
 FYI - patient had an e-visit today with Delon B PA

## 2024-02-09 NOTE — Telephone Encounter (Unsigned)
 Copied from CRM #[id number]. Topic: Clinical - Red Word Triage >> Feb 09, 2024  9:43 AM Viola FALCON wrote: Red Word that prompted transfer to Nurse Triage: Patient says she has sinus infection, pressure/mucus/pain/swelling for 2 weeks

## 2024-02-09 NOTE — Telephone Encounter (Signed)
°  Copied from CRM #[id number]. Topic: Clinical - Red Word Triage >> Feb 09, 2024  9:43 AM Viola FALCON wrote: Red Word that prompted transfer to Nurse Triage: Patient says she has sinus infection, pressure/mucus/pain/swelling for 2 weeks      Called pt back and pt informed she disconnected this morning due to sending message through mychart for e-visit - therefore her issues are currently being addressed and no need for triage at present time.

## 2024-02-10 ENCOUNTER — Inpatient Hospital Stay
Admission: RE | Admit: 2024-02-10 | Discharge: 2024-02-10 | Attending: Obstetrics & Gynecology | Admitting: Obstetrics & Gynecology

## 2024-02-10 DIAGNOSIS — Z803 Family history of malignant neoplasm of breast: Secondary | ICD-10-CM

## 2024-02-10 MED ORDER — GADOPICLENOL 0.5 MMOL/ML IV SOLN
6.0000 mL | Freq: Once | INTRAVENOUS | Status: AC | PRN
  Administered 2024-02-10: 12:00:00 6 mL via INTRAVENOUS

## 2024-03-03 ENCOUNTER — Encounter: Payer: Self-pay | Admitting: Family Medicine

## 2024-03-03 DIAGNOSIS — Z8669 Personal history of other diseases of the nervous system and sense organs: Secondary | ICD-10-CM

## 2024-03-03 DIAGNOSIS — F988 Other specified behavioral and emotional disorders with onset usually occurring in childhood and adolescence: Secondary | ICD-10-CM

## 2024-03-03 DIAGNOSIS — F418 Other specified anxiety disorders: Secondary | ICD-10-CM

## 2024-03-06 NOTE — Telephone Encounter (Signed)
 Patient is wanting a referral to a dietician for the following reasons, please see mychart message. Thank you

## 2024-03-08 NOTE — Telephone Encounter (Signed)
 Patient does not need a referral initial message states she needs a PA completed through [facility] and notes this should not need a referral to be completed.  Pharmacy PA team cannot assist as this is not related to a medication and Tobias Ally cannot assist as this is not related to a Referral.  Calling [employer] authorization and referral line at [phone] in attempt to get authorization for patient will be best next step.  Sent message to patient to obtain photo of insurance card for verification prior to making call to insurance.

## 2024-03-10 NOTE — Telephone Encounter (Signed)
 Two calls made to [facility] at the designated [phone] Prior Authorization line and both times the wait time was over 30 minutes in the interest of other messages due to it being a Friday I was unable to complete these calls. Will attempt to call again on Monday.

## 2024-03-15 NOTE — Telephone Encounter (Signed)
 Called again today, wait times still exceeding 30 minutes will attempt again later in hopes of more productive outcome

## 2024-03-21 NOTE — Telephone Encounter (Signed)
 Called patient to let her know that referral was ordered.   Patient said she reached out to dietitian and received CPT codes. She said that she would send them to us .

## 2024-03-21 NOTE — Telephone Encounter (Signed)
 Noted.  I really appreciate everyone's help and significant time spent on trying to get this situated for Ms. Lori Rice.  I will go ahead and place a referral to that provider to see if that can help start the process even though her prior messages indicate that the dietitian said not to do a referral.  However I still think that might be needed at some point, then prior authorization.  I have placed that referral, please let me know how I can help further and again thank you for all the time spent.

## 2024-03-21 NOTE — Telephone Encounter (Signed)
 Lori Applethwaite, MS RD LDN  Fax: [phone]  [phone] [address] [place], [address] By appointment only

## 2024-03-21 NOTE — Telephone Encounter (Signed)
 Was on the phone for 54 minutes with [facility] PA department. They told me they cannot tell me if this patient needs a PA or not without CPT codes. She said normally a referral is placed and then possibly a PA is needed. But it looks like this dietician is in network with patients plan. The dietitian will needs to call [facility] PA department to let them know what type of services the patient would receive. I spoke with Lorenza SQUIBB. I will call patient and update her on what I was told.

## 2024-03-21 NOTE — Telephone Encounter (Signed)
 I spoke with the patient and she verbalized understanding. She is not sure as well. She is okay with a referral being placed if needed. She did not mean for this to be complicated. She will call dietitian as well to let them know.

## 2024-03-21 NOTE — Telephone Encounter (Signed)
 Routing to referral team to see if they can help our team out. This process shouldn't be this difficult. But maybe they can assist or let us  know if we have to place a referral.

## 2024-03-21 NOTE — Telephone Encounter (Signed)
 Tried calling today and wait times were over 45 minutes. Will try to stay on hold for as long as I can.

## 2024-03-23 NOTE — Telephone Encounter (Signed)
 See patient mychart message for CPT codes needed for PA

## 2024-03-31 ENCOUNTER — Other Ambulatory Visit: Payer: Self-pay | Admitting: Family Medicine

## 2024-03-31 DIAGNOSIS — F418 Other specified anxiety disorders: Secondary | ICD-10-CM

## 2024-05-01 ENCOUNTER — Encounter: Admitting: Family Medicine
